# Patient Record
Sex: Male | Born: 1935
Health system: Southern US, Community
[De-identification: ages and names within clinical notes are randomized; demographics above are authoritative.]

## PROBLEM LIST (undated history)

## (undated) DIAGNOSIS — M549 Dorsalgia, unspecified: Secondary | ICD-10-CM

## (undated) DIAGNOSIS — Z9889 Other specified postprocedural states: Secondary | ICD-10-CM

## (undated) DIAGNOSIS — I10 Essential (primary) hypertension: Secondary | ICD-10-CM

## (undated) DIAGNOSIS — G8929 Other chronic pain: Secondary | ICD-10-CM

## (undated) DIAGNOSIS — J449 Chronic obstructive pulmonary disease, unspecified: Secondary | ICD-10-CM

## (undated) DIAGNOSIS — N401 Enlarged prostate with lower urinary tract symptoms: Secondary | ICD-10-CM

## (undated) DIAGNOSIS — E785 Hyperlipidemia, unspecified: Secondary | ICD-10-CM

## (undated) DIAGNOSIS — R972 Elevated prostate specific antigen [PSA]: Secondary | ICD-10-CM

## (undated) DIAGNOSIS — K644 Residual hemorrhoidal skin tags: Secondary | ICD-10-CM

## (undated) DIAGNOSIS — N138 Other obstructive and reflux uropathy: Secondary | ICD-10-CM

## (undated) DIAGNOSIS — M1990 Unspecified osteoarthritis, unspecified site: Secondary | ICD-10-CM

## (undated) HISTORY — DX: Essential (primary) hypertension: I10

## (undated) HISTORY — DX: Other specified postprocedural states: Z98.890

## (undated) HISTORY — DX: Residual hemorrhoidal skin tags: K64.4

## (undated) HISTORY — DX: Chronic obstructive pulmonary disease, unspecified: J44.9

## (undated) HISTORY — DX: Unspecified osteoarthritis, unspecified site: M19.90

## (undated) HISTORY — DX: Elevated prostate specific antigen (PSA): R97.20

## (undated) HISTORY — DX: Hyperlipidemia, unspecified: E78.5

---

## 1957-10-16 HISTORY — PX: FRACTURE SURGERY: SHX138

## 1981-10-16 HISTORY — PX: INGUINAL HERNIA REPAIR: SUR1180

## 2000-06-04 ENCOUNTER — Other Ambulatory Visit: Admission: RE | Admit: 2000-06-04 | Discharge: 2000-06-04 | Payer: Self-pay | Admitting: Urology

## 2000-06-05 ENCOUNTER — Other Ambulatory Visit: Admission: RE | Admit: 2000-06-05 | Discharge: 2000-06-05 | Payer: Self-pay | Admitting: Urology

## 2001-10-02 ENCOUNTER — Ambulatory Visit (HOSPITAL_COMMUNITY): Admission: RE | Admit: 2001-10-02 | Discharge: 2001-10-02 | Payer: Self-pay | Admitting: *Deleted

## 2001-10-02 ENCOUNTER — Encounter (INDEPENDENT_AMBULATORY_CARE_PROVIDER_SITE_OTHER): Payer: Self-pay | Admitting: *Deleted

## 2001-10-16 LAB — HM COLONOSCOPY: HM Colonoscopy: NORMAL

## 2004-04-10 ENCOUNTER — Emergency Department (HOSPITAL_COMMUNITY): Admission: EM | Admit: 2004-04-10 | Discharge: 2004-04-10 | Payer: Self-pay | Admitting: Emergency Medicine

## 2004-04-13 ENCOUNTER — Emergency Department (HOSPITAL_COMMUNITY): Admission: EM | Admit: 2004-04-13 | Discharge: 2004-04-13 | Payer: Self-pay | Admitting: Emergency Medicine

## 2005-05-24 ENCOUNTER — Emergency Department (HOSPITAL_COMMUNITY): Admission: EM | Admit: 2005-05-24 | Discharge: 2005-05-24 | Payer: Self-pay | Admitting: Emergency Medicine

## 2010-08-18 ENCOUNTER — Inpatient Hospital Stay (HOSPITAL_COMMUNITY)
Admission: EM | Admit: 2010-08-18 | Discharge: 2010-08-22 | Payer: Self-pay | Source: Home / Self Care | Admitting: Emergency Medicine

## 2010-08-18 IMAGING — CR DG CHEST 2V
2 series · 2 of 2 positions shown · non-contrast
Comparison: None.

CLINICAL DATA: Decreased O2 sats.  Urinary retention.

CHEST - 2 VIEW

[view not recorded (1 of 2)]
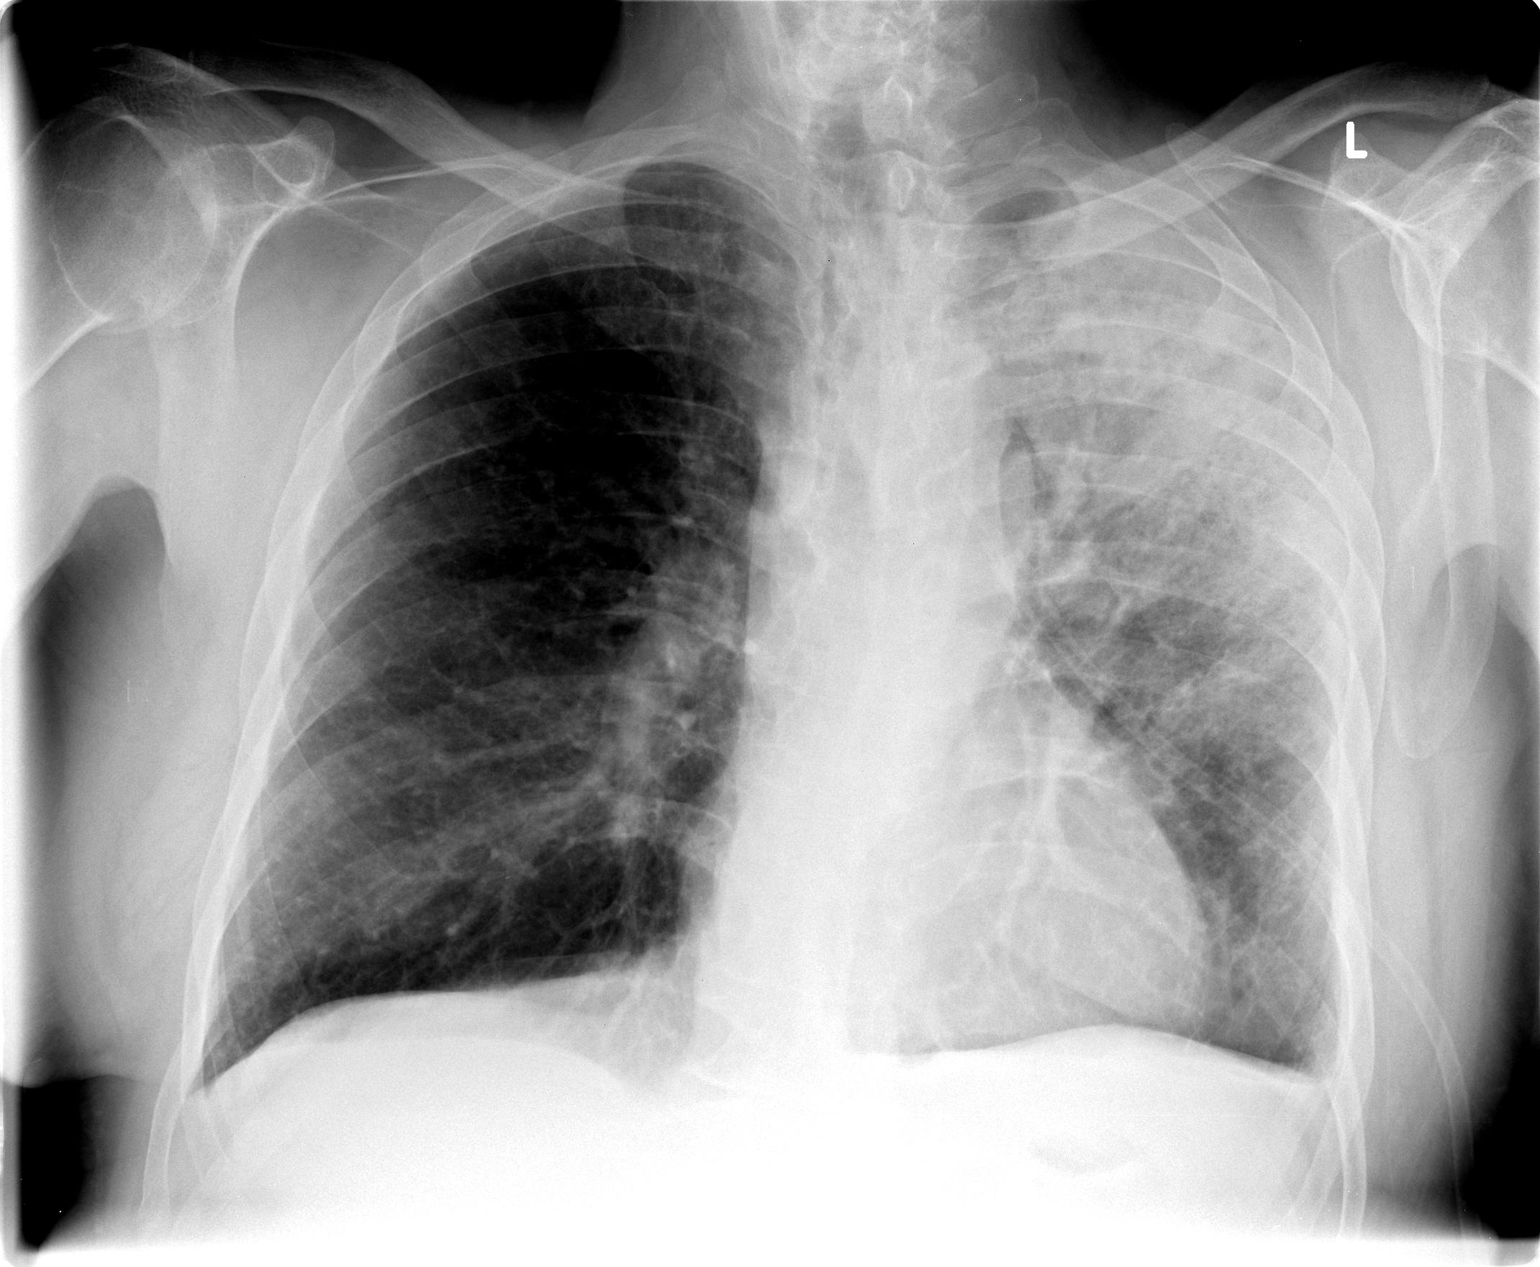

[view not recorded (2 of 2)]
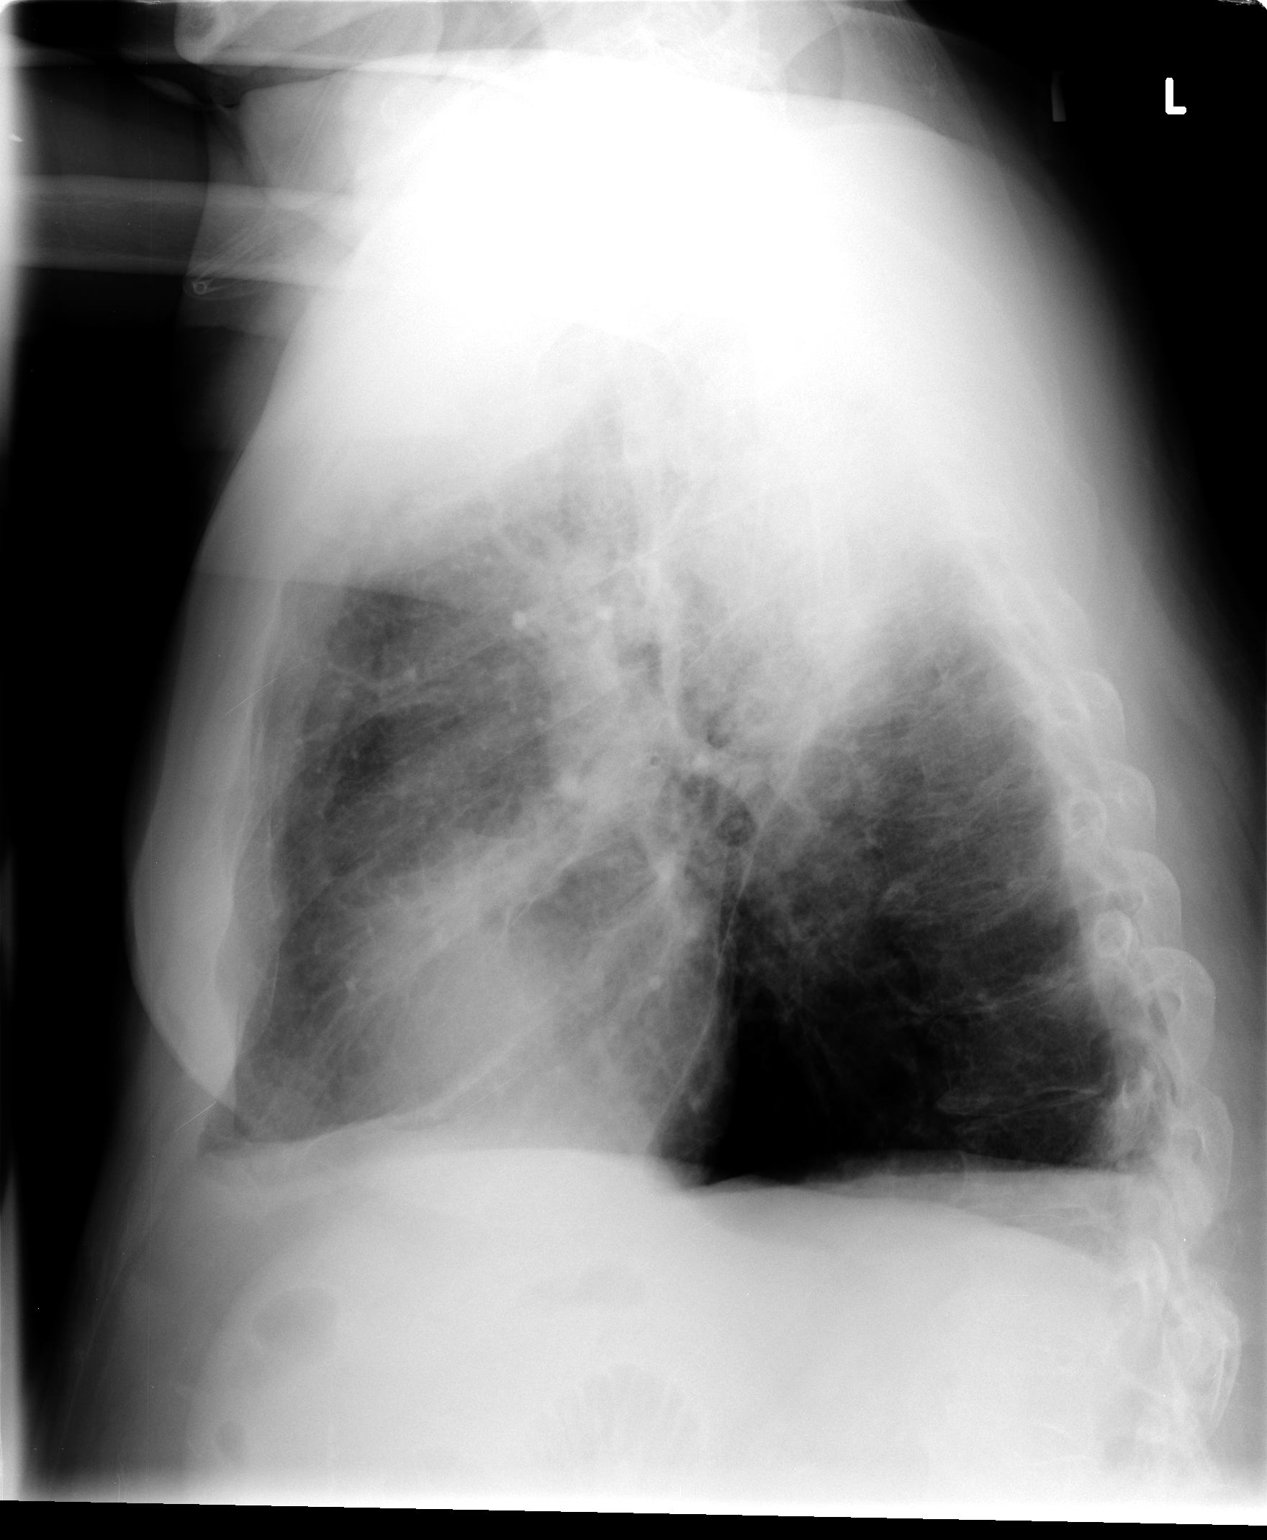

[2 of 2 positions shown; findings below may reference images not displayed]

FINDINGS: Trachea is midline.  Heart size normal.  There is left
upper lobe air space consolidation.  Probable underlying emphysema.
Tiny left pleural effusion or pleural thickening.
IMPRESSION: Left upper lobe air space consolidation likely represents
pneumonia.  Follow-up to clearing is recommended, as a central
obstructing mass can create this appearance.

## 2010-08-21 IMAGING — CT CT ANGIO CHEST
1 of 6 series · 5 of 36 positions shown · IV contrast (Omnipaque 300)
Comparison: Chest x-ray [DATE]

CLINICAL DATA: Pneumonia, urinary retention.  Hypoxia.  History of
smoking.

CT ANGIOGRAPHY CHEST WITH CONTRAST
TECHNIQUE: Multidetector CT imaging of the chest was performed
using the standard protocol during bolus administration of
intravenous contrast.  Multiplanar CT image reconstructions
including MIPs were obtained to evaluate the vascular anatomy.
Contrast:  100 ml [ET] the

[Series 4: pe 3.0 b40f · axial · 0.71mm/px · z∈[-193,-28]mm · 5 of 83 slices shown]
[im 14/83  lung]
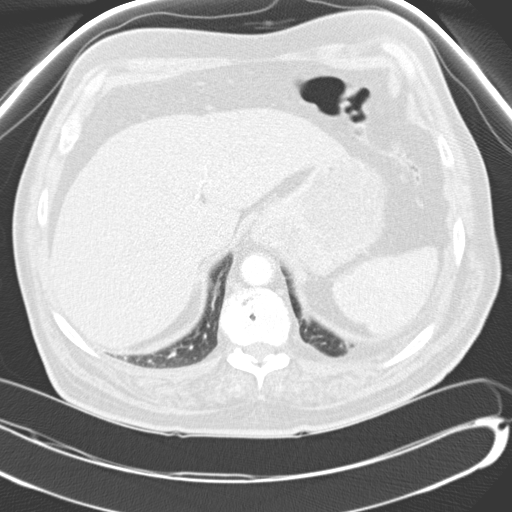
[im 28/83  mediastinal]
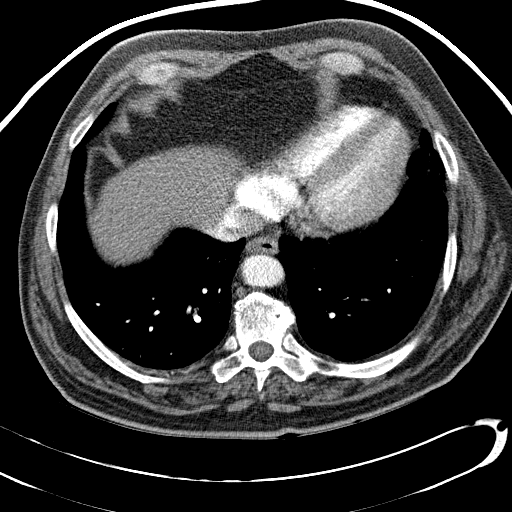
[im 42/83  lung]
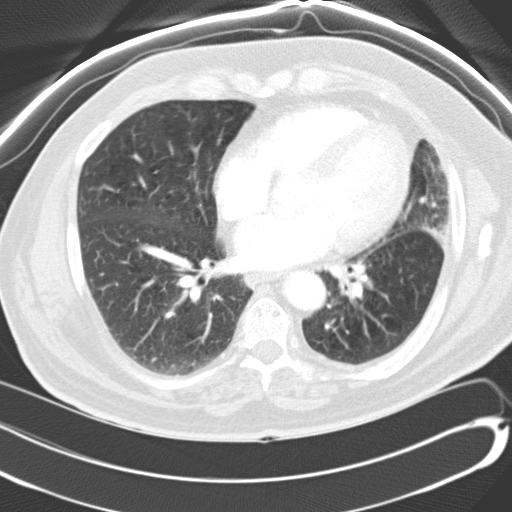
[im 55/83  mediastinal]
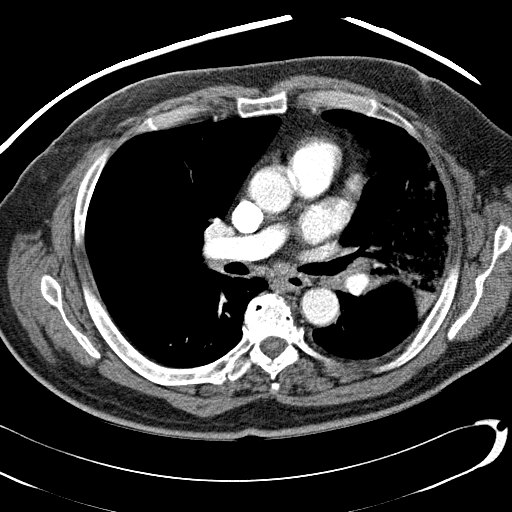
[im 69/83  lung]
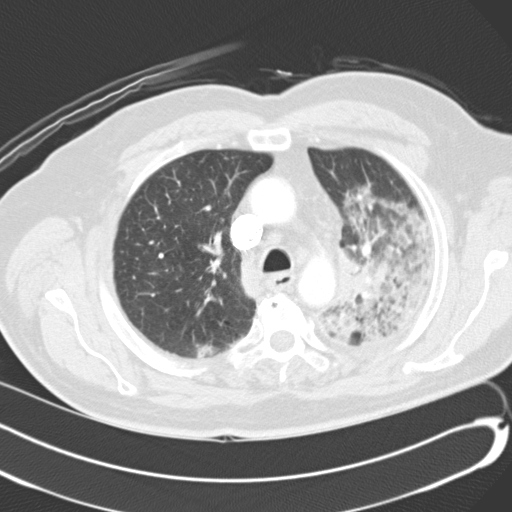

[5 of 36 positions shown; findings below may reference images not displayed]

FINDINGS: The pulmonary arteries are well opacified by the
contrast bolus.  There is no evidence for acute pulmonary embolus.
There is dense air space filling within the left upper lobe.  Left
hilar mediastinal lymph nodes are enlarged.  Prevascular lymph node
is 2.2 cm.  AP window lymph node is 1.9 cm.  Left  hilar node is
2.2 cm, best seen on the coronal images.  No  central endobronchial
lesion is identified.  Although the findings may be related to
infection and reactive lymphadenopathy, malignancy is difficult to
exclude.  Follow-up to document clearing is recommended.

The heart size is normal.  No evidence for pulmonary edema.  There
is minimal atelectasis in the posterior right lung which is
otherwise clear.

Images of the upper abdomen show 2 low attenuation lesions within
the liver, too small to characterize.  These measure 9 mm each and
are both seen on image 67 of the axial series.

Review of the MIP images confirms the above findings.
IMPRESSION: 1.  Technically adequate exam showing no evidence for acute
pulmonary embolus.
2.  Extensive consolidation throughout the left upper lobe,
associated with mediastinal and hilar adenopathy.  Follow-up until
clearing is recommended to exclude malignancy.

## 2010-10-16 DIAGNOSIS — R972 Elevated prostate specific antigen [PSA]: Secondary | ICD-10-CM

## 2010-10-16 HISTORY — DX: Elevated prostate specific antigen (PSA): R97.20

## 2010-12-27 LAB — COMPREHENSIVE METABOLIC PANEL
ALT: 11 U/L (ref 0–53)
AST: 16 U/L (ref 0–37)
Albumin: 2.5 g/dL — ABNORMAL LOW (ref 3.5–5.2)
Alkaline Phosphatase: 72 U/L (ref 39–117)
BUN: 15 mg/dL (ref 6–23)
CO2: 25 mEq/L (ref 19–32)
Calcium: 9.2 mg/dL (ref 8.4–10.5)
Chloride: 105 mEq/L (ref 96–112)
Creatinine, Ser: 0.92 mg/dL (ref 0.4–1.5)
GFR calc Af Amer: >60 mL/min (ref >60)
GFR calc non Af Amer: >60 mL/min (ref >60)
Glucose, Bld: 101 mg/dL — ABNORMAL HIGH (ref 70–99)
Potassium: 3.3 mEq/L — ABNORMAL LOW (ref 3.5–5.1)
Sodium: 137 mEq/L (ref 135–145)
Total Bilirubin: 0.6 mg/dL (ref 0.3–1.2)
Total Protein: 5.8 g/dL — ABNORMAL LOW (ref 6.0–8.3)

## 2010-12-27 LAB — BASIC METABOLIC PANEL
BUN: 11 mg/dL (ref 6–23)
BUN: 17 mg/dL (ref 6–23)
BUN: 20 mg/dL (ref 6–23)
CO2: 23 mEq/L (ref 19–32)
CO2: 25 mEq/L (ref 19–32)
CO2: 29 mEq/L (ref 19–32)
Calcium: 10 mg/dL (ref 8.4–10.5)
Calcium: 9.1 mg/dL (ref 8.4–10.5)
Calcium: 9.2 mg/dL (ref 8.4–10.5)
Chloride: 104 mEq/L (ref 96–112)
Chloride: 105 mEq/L (ref 96–112)
Chloride: 96 mEq/L (ref 96–112)
Creatinine, Ser: 0.77 mg/dL (ref 0.4–1.5)
Creatinine, Ser: 0.93 mg/dL (ref 0.4–1.5)
Creatinine, Ser: 1.26 mg/dL (ref 0.4–1.5)
GFR calc Af Amer: >60 mL/min (ref >60)
GFR calc Af Amer: >60 mL/min (ref >60)
GFR calc Af Amer: >60 mL/min (ref >60)
GFR calc non Af Amer: 56 mL/min — ABNORMAL LOW (ref >60)
GFR calc non Af Amer: >60 mL/min (ref >60)
GFR calc non Af Amer: >60 mL/min (ref >60)
Glucose, Bld: 127 mg/dL — ABNORMAL HIGH (ref 70–99)
Glucose, Bld: 160 mg/dL — ABNORMAL HIGH (ref 70–99)
Glucose, Bld: 176 mg/dL — ABNORMAL HIGH (ref 70–99)
Potassium: 3.6 mEq/L (ref 3.5–5.1)
Potassium: 3.8 mEq/L (ref 3.5–5.1)
Potassium: 4.2 mEq/L (ref 3.5–5.1)
Sodium: 131 mEq/L — ABNORMAL LOW (ref 135–145)
Sodium: 134 mEq/L — ABNORMAL LOW (ref 135–145)
Sodium: 140 mEq/L (ref 135–145)

## 2010-12-27 LAB — BLOOD GAS, ARTERIAL
Acid-Base Excess: 1 mmol/L (ref 0.0–2.0)
Bicarbonate: 24.4 mEq/L — ABNORMAL HIGH (ref 20.0–24.0)
O2 Content: 2 L/min
O2 Saturation: 90.3 %
Patient temperature: 37
TCO2: 20.2 mmol/L (ref 0–100)
pCO2 arterial: 34.6 mmHg — ABNORMAL LOW (ref 35.0–45.0)
pH, Arterial: 7.462 — ABNORMAL HIGH (ref 7.350–7.450)
pO2, Arterial: 53.6 mmHg — ABNORMAL LOW (ref 80.0–100.0)

## 2010-12-27 LAB — CBC
HCT: 35.8 % — ABNORMAL LOW (ref 39.0–52.0)
HCT: 38.8 % — ABNORMAL LOW (ref 39.0–52.0)
HCT: 42.5 % (ref 39.0–52.0)
Hemoglobin: 12.1 g/dL — ABNORMAL LOW (ref 13.0–17.0)
Hemoglobin: 13 g/dL (ref 13.0–17.0)
Hemoglobin: 14.1 g/dL (ref 13.0–17.0)
MCH: 29.1 pg (ref 26.0–34.0)
MCH: 29.2 pg (ref 26.0–34.0)
MCH: 29.5 pg (ref 26.0–34.0)
MCHC: 33.2 g/dL (ref 30.0–36.0)
MCHC: 33.5 g/dL (ref 30.0–36.0)
MCHC: 33.9 g/dL (ref 30.0–36.0)
MCV: 87 fL (ref 78.0–100.0)
MCV: 87.3 fL (ref 78.0–100.0)
MCV: 87.4 fL (ref 78.0–100.0)
Platelets: 143 10*3/uL — ABNORMAL LOW (ref 150–400)
Platelets: 149 10*3/uL — ABNORMAL LOW (ref 150–400)
Platelets: 181 10*3/uL (ref 150–400)
RBC: 4.12 MIL/uL — ABNORMAL LOW (ref 4.22–5.81)
RBC: 4.45 MIL/uL (ref 4.22–5.81)
RBC: 4.86 MIL/uL (ref 4.22–5.81)
RDW: 13.7 % (ref 11.5–15.5)
RDW: 14 % (ref 11.5–15.5)
RDW: 14 % (ref 11.5–15.5)
WBC: 15.2 10*3/uL — ABNORMAL HIGH (ref 4.0–10.5)
WBC: 19.3 10*3/uL — ABNORMAL HIGH (ref 4.0–10.5)
WBC: 8.9 10*3/uL (ref 4.0–10.5)

## 2010-12-27 LAB — DIFFERENTIAL
Basophils Absolute: 0 10*3/uL (ref 0.0–0.1)
Basophils Absolute: 0 10*3/uL (ref 0.0–0.1)
Basophils Absolute: 0 10*3/uL (ref 0.0–0.1)
Basophils Relative: 0 % (ref 0–1)
Basophils Relative: 0 % (ref 0–1)
Basophils Relative: 0 % (ref 0–1)
Eosinophils Absolute: 0 10*3/uL (ref 0.0–0.7)
Eosinophils Absolute: 0 10*3/uL (ref 0.0–0.7)
Eosinophils Absolute: 0 10*3/uL (ref 0.0–0.7)
Eosinophils Relative: 0 % (ref 0–5)
Eosinophils Relative: 0 % (ref 0–5)
Eosinophils Relative: 0 % (ref 0–5)
Lymphocytes Relative: 10 % — ABNORMAL LOW (ref 12–46)
Lymphocytes Relative: 4 % — ABNORMAL LOW (ref 12–46)
Lymphocytes Relative: 8 % — ABNORMAL LOW (ref 12–46)
Lymphs Abs: 0.8 10*3/uL (ref 0.7–4.0)
Lymphs Abs: 0.9 10*3/uL (ref 0.7–4.0)
Lymphs Abs: 1.2 10*3/uL (ref 0.7–4.0)
Monocytes Absolute: 0.6 10*3/uL (ref 0.1–1.0)
Monocytes Absolute: 1.1 10*3/uL — ABNORMAL HIGH (ref 0.1–1.0)
Monocytes Absolute: 2.7 10*3/uL — ABNORMAL HIGH (ref 0.1–1.0)
Monocytes Relative: 14 % — ABNORMAL HIGH (ref 3–12)
Monocytes Relative: 6 % (ref 3–12)
Monocytes Relative: 7 % (ref 3–12)
Neutro Abs: 12.9 10*3/uL — ABNORMAL HIGH (ref 1.7–7.7)
Neutro Abs: 15.8 10*3/uL — ABNORMAL HIGH (ref 1.7–7.7)
Neutro Abs: 7.4 10*3/uL (ref 1.7–7.7)
Neutrophils Relative %: 82 % — ABNORMAL HIGH (ref 43–77)
Neutrophils Relative %: 83 % — ABNORMAL HIGH (ref 43–77)
Neutrophils Relative %: 85 % — ABNORMAL HIGH (ref 43–77)

## 2010-12-27 LAB — URINALYSIS, ROUTINE W REFLEX MICROSCOPIC
Bilirubin Urine: NEGATIVE
Glucose, UA: NEGATIVE mg/dL
Ketones, ur: NEGATIVE mg/dL
Leukocytes, UA: NEGATIVE
Nitrite: NEGATIVE
Specific Gravity, Urine: 1.01 (ref 1.005–1.030)
Urobilinogen, UA: 1 mg/dL (ref 0.0–1.0)
pH: 6.5 (ref 5.0–8.0)

## 2010-12-27 LAB — LEGIONELLA ANTIGEN, URINE: Legionella Antigen, Urine: NEGATIVE

## 2010-12-27 LAB — URINE CULTURE
Colony Count: NO GROWTH
Culture  Setup Time: 201111031143
Culture: NO GROWTH

## 2010-12-27 LAB — URINE MICROSCOPIC-ADD ON

## 2010-12-27 LAB — EXPECTORATED SPUTUM ASSESSMENT W GRAM STAIN, RFLX TO RESP C

## 2011-03-03 NOTE — Procedures (Signed)
[place]  Patient:    Kenneth Woods, Kenneth Woods Visit Number: [id number] MRN: [id number]          Service Type: END Location: ENDO Attending Physician:  Council Mechanic Dictated by:   Anselmo Rod, M.D. Proc. Date: 10/02/01 Admit Date:  10/02/2001   CC:         Dr. Gerome Apley   Procedure Report  DATE OF BIRTH:  02-04-1934.  PROCEDURE:  Colonoscopy with snare polypectomy x 1 and hot biopsies x 4.  ENDOSCOPIST:  Anselmo Rod, M.D.  INSTRUMENT USED:  Adult video colonoscope.  INDICATION FOR PROCEDURE:  Blood in stool in a 77 year old white male.  Rule out colonic polyps, masses, hemorrhoids, etc.  PREPROCEDURE PREPARATION:  Informed consent was procured from the patient. The patient was fasted for eight hours prior to the procedure and prepped with a bottle of magnesium citrate and a gallon of NuLytely the night prior to the procedure.  PREPROCEDURE PHYSICAL:  VITAL SIGNS:  The patient had stable vital signs.  NECK:  Supple.  CHEST:  Clear to auscultation except for some decreased breath sounds at the bases.  No murmur, rub, or gallop.  ABDOMEN:  Obese with normal bowel sounds.  DESCRIPTION OF PROCEDURE:  The patient was placed in the left lateral decubitus position and sedated with 60 mg of Demerol and 6 mg of Versed intravenously.  Once the patient was adequately sedate and maintained on low-flow oxygen and continuous cardiac monitoring, the Olympus video colonoscope was advanced from the rectum to the cecum without difficulty.  The patient had prominent internal hemorrhoids and early left-sided diverticulosis.  Three small polyps were hot biopsied from the rectum and one polyp was snared from 60 cm.  Another polyp was hot biopsied from 60 cm.  The patient tolerated the procedure well without complications.  The cecum and the appendiceal orifice appeared healthy and without lesions.  IMPRESSION: 1. Multiple colonic  polyps removed as mentioned above. 2. Prominent internal hemorrhoids. 3. Early left-sided diverticulosis.  RECOMMENDATIONS: 1. Await pathology results. 2. Avoid all nonsteroidals, including aspirin, for the next three weeks. 3. Outpatient follow-up in the next four weeks for further recommendations. Dictated by:   Anselmo Rod, M.D. Attending Physician:  Council Mechanic DD:  10/02/01 TD:  10/02/01 Job: [id number]

## 2012-12-14 ENCOUNTER — Encounter: Payer: Self-pay | Admitting: *Deleted

## 2013-01-19 ENCOUNTER — Emergency Department (HOSPITAL_COMMUNITY)
Admission: EM | Admit: 2013-01-19 | Discharge: 2013-01-20 | Disposition: A | Discharge status: Home / Self Care | Payer: Medicare Other | Attending: Emergency Medicine | Admitting: Emergency Medicine

## 2013-01-19 ENCOUNTER — Encounter (HOSPITAL_COMMUNITY): Payer: Self-pay | Admitting: Emergency Medicine

## 2013-01-19 DIAGNOSIS — R339 Retention of urine, unspecified: Secondary | ICD-10-CM

## 2013-01-19 DIAGNOSIS — Z87891 Personal history of nicotine dependence: Secondary | ICD-10-CM | POA: Insufficient documentation

## 2013-01-19 DIAGNOSIS — R109 Unspecified abdominal pain: Secondary | ICD-10-CM | POA: Insufficient documentation

## 2013-01-19 DIAGNOSIS — Z79899 Other long term (current) drug therapy: Secondary | ICD-10-CM | POA: Insufficient documentation

## 2013-01-19 DIAGNOSIS — R143 Flatulence: Secondary | ICD-10-CM | POA: Insufficient documentation

## 2013-01-19 DIAGNOSIS — R300 Dysuria: Secondary | ICD-10-CM | POA: Insufficient documentation

## 2013-01-19 DIAGNOSIS — R141 Gas pain: Secondary | ICD-10-CM | POA: Insufficient documentation

## 2013-01-19 DIAGNOSIS — N400 Enlarged prostate without lower urinary tract symptoms: Secondary | ICD-10-CM | POA: Insufficient documentation

## 2013-01-19 DIAGNOSIS — R142 Eructation: Secondary | ICD-10-CM | POA: Insufficient documentation

## 2013-01-19 MED ORDER — LIDOCAINE HCL 2 % EX GEL
Freq: Once | CUTANEOUS | Status: AC
  Administered 2013-01-20: 15 via URETHRAL
  Filled 2013-01-19: qty 20

## 2013-01-19 NOTE — ED Notes (Signed)
PT. REPORTS URINARY RETENTION TODAY STATES HISTORY OF PROSTATE ENLARGEMENT , LAST VOIDED 5 HOURS AGO.

## 2013-01-20 LAB — URINALYSIS, MICROSCOPIC ONLY
Bilirubin Urine: NEGATIVE
Glucose, UA: NEGATIVE mg/dL
Hgb urine dipstick: NEGATIVE
Ketones, ur: NEGATIVE mg/dL
Leukocytes, UA: NEGATIVE
Nitrite: NEGATIVE
Protein, ur: 30 mg/dL — AB
Specific Gravity, Urine: 1.014 (ref 1.005–1.030)
Urobilinogen, UA: 0.2 mg/dL (ref 0.0–1.0)
pH: 8.5 — ABNORMAL HIGH (ref 5.0–8.0)

## 2013-01-20 LAB — POCT I-STAT, CHEM 8
BUN: 9 mg/dL (ref 6–23)
Calcium, Ion: 1.09 mmol/L — ABNORMAL LOW (ref 1.13–1.30)
Chloride: 98 mEq/L (ref 96–112)
Creatinine, Ser: 0.9 mg/dL (ref 0.50–1.35)
Glucose, Bld: 131 mg/dL — ABNORMAL HIGH (ref 70–99)
HCT: 47 % (ref 39.0–52.0)
Hemoglobin: 16 g/dL (ref 13.0–17.0)
Potassium: 3.3 mEq/L — ABNORMAL LOW (ref 3.5–5.1)
Sodium: 140 mEq/L (ref 135–145)
TCO2: 32 mmol/L (ref 0–100)

## 2013-01-20 NOTE — ED Notes (Signed)
Pt alert, restless, uncomfortable, standing, pacing, "unable to lie down",  c/o bladder pain, last void ~ 5 hrs ago, "relates sx to enlarged prostate", denies other urinary sx or issues, "has had a foley before", denies h/o GU surgery. Pt cooperative. Family at [facility].

## 2013-01-20 NOTE — ED Provider Notes (Signed)
History     CSN: [id number]  Arrival date & time 01/19/13  2337   First MD Initiated Contact with Patient 01/19/13 2354      Chief Complaint  Patient presents with  . Urinary Retention    (Consider location/radiation/quality/duration/timing/severity/associated sxs/prior treatment) HPI Pt with history of BPH and urinary retention p/w urinary retention starting at 1200 yesterday. +suprapubic fullness. No fever, chills, hematuria, dysuria, flank pain. Foley cath in place and pt is currently asymptomatic.  Past Medical History  Diagnosis Date  . S/P TURP   . H/O prostate biopsy   . Elevated PSA 10/16/2010  . Prostate enlargement     Past Surgical History  Procedure Laterality Date  . Fracture surgery  1959    Work accident-caused FX whole body expect rt arm,was in body cast- was working on [employer]. fell off- hosp for 5 mos.    Family History  Problem Relation Age of Onset  . CVA Mother   . Cancer Father     brain tumor    History  Substance Use Topics  . Smoking status: Former Games developer  . Smokeless tobacco: Not on file  . Alcohol Use: No      Review of Systems  Constitutional: Negative for fever and chills.  Respiratory: Negative for shortness of breath.   Cardiovascular: Negative for chest pain.  Gastrointestinal: Positive for abdominal pain and abdominal distention. Negative for nausea, vomiting, diarrhea and constipation.  Genitourinary: Positive for difficulty urinating. Negative for dysuria, frequency, hematuria and flank pain.  Musculoskeletal: Negative for myalgias and back pain.  Skin: Negative for pallor, rash and wound.  Neurological: Negative for dizziness, weakness, numbness and headaches.  All other systems reviewed and are negative.    Allergies  Penicillins  Home Medications   Current Outpatient Rx  Name  Route  Sig  Dispense  Refill  . amLODipine (NORVASC) 5 MG tablet   Oral   Take 5 mg by mouth daily.         Marland Kitchen  lisinopril (PRINIVIL,ZESTRIL) 5 MG tablet   Oral   Take 5 mg by mouth daily.         Marland Kitchen OVER THE COUNTER MEDICATION   Oral   Take 1 tablet by mouth daily. Herbal supplement for prostate         . tamsulosin (FLOMAX) 0.4 MG CAPS   Oral   Take 0.4 mg by mouth daily.           BP 195/90  Pulse 86  Temp(Src) 97.7 F (36.5 C) (Oral)  Resp 16  SpO2 94%  Physical Exam  Nursing note and vitals reviewed. Constitutional: He is oriented to person, place, and time. He appears well-developed and well-nourished. No distress.  HENT:  Head: Normocephalic and atraumatic.  Mouth/Throat: Oropharynx is clear and moist.  Eyes: EOM are normal. Pupils are equal, round, and reactive to light.  Neck: Normal range of motion. Neck supple.  Cardiovascular: Normal rate and regular rhythm.   Pulmonary/Chest: Effort normal and breath sounds normal. No respiratory distress. He has no wheezes. He has no rales.  Abdominal: Soft. Bowel sounds are normal. He exhibits no distension. There is no tenderness. There is no rebound and no guarding.  Musculoskeletal: Normal range of motion. He exhibits no edema and no tenderness.  Neurological: He is alert and oriented to person, place, and time.  Skin: Skin is warm and dry. No rash noted. No erythema.  Psychiatric: He has a normal mood and affect.  His behavior is normal.    ED Course  Procedures (including critical care time)  Labs Reviewed  URINALYSIS, MICROSCOPIC ONLY - Abnormal; Notable for the following:    APPearance CLOUDY (*)    pH 8.5 (*)    Protein, ur 30 (*)    Bacteria, UA FEW (*)    All other components within normal limits  POCT I-STAT, CHEM 8 - Abnormal; Notable for the following:    Potassium 3.3 (*)    Glucose, Bld 131 (*)    Calcium, Ion 1.09 (*)    All other components within normal limits   No results found.   1. Urinary retention       MDM   Follow up with urology.        Loren Racer, MD 01/20/13 [phone]

## 2013-03-11 ENCOUNTER — Encounter: Payer: Self-pay | Admitting: Physician Assistant

## 2013-03-24 ENCOUNTER — Other Ambulatory Visit: Payer: Self-pay | Admitting: Family Medicine

## 2013-04-24 ENCOUNTER — Ambulatory Visit (INDEPENDENT_AMBULATORY_CARE_PROVIDER_SITE_OTHER): Payer: Medicare Other | Admitting: Physician Assistant

## 2013-04-24 ENCOUNTER — Encounter: Payer: Self-pay | Admitting: Physician Assistant

## 2013-04-24 VITALS — BP 134/76 | HR 60 | Temp 98.1°F | Resp 18 | Ht 62.25 in | Wt 176.0 lb

## 2013-04-24 DIAGNOSIS — J449 Chronic obstructive pulmonary disease, unspecified: Secondary | ICD-10-CM | POA: Insufficient documentation

## 2013-04-24 DIAGNOSIS — M1990 Unspecified osteoarthritis, unspecified site: Secondary | ICD-10-CM

## 2013-04-24 DIAGNOSIS — R972 Elevated prostate specific antigen [PSA]: Secondary | ICD-10-CM

## 2013-04-24 DIAGNOSIS — M129 Arthropathy, unspecified: Secondary | ICD-10-CM

## 2013-04-24 DIAGNOSIS — E785 Hyperlipidemia, unspecified: Secondary | ICD-10-CM | POA: Insufficient documentation

## 2013-04-24 DIAGNOSIS — I10 Essential (primary) hypertension: Secondary | ICD-10-CM | POA: Insufficient documentation

## 2013-04-24 DIAGNOSIS — N400 Enlarged prostate without lower urinary tract symptoms: Secondary | ICD-10-CM | POA: Insufficient documentation

## 2013-04-24 LAB — BASIC METABOLIC PANEL WITH GFR
BUN: 11 mg/dL (ref 6–23)
CO2: 29 mEq/L (ref 19–32)
Calcium: 9.7 mg/dL (ref 8.4–10.5)
Chloride: 101 mEq/L (ref 96–112)
Creat: 0.91 mg/dL (ref 0.50–1.35)
GFR, Est African American: >89 mL/min
GFR, Est Non African American: 81 mL/min
Glucose, Bld: 105 mg/dL — ABNORMAL HIGH (ref 70–99)
Potassium: 4.9 mEq/L (ref 3.5–5.3)
Sodium: 137 mEq/L (ref 135–145)

## 2013-04-24 LAB — PSA, MEDICARE: PSA: 10.03 ng/mL — ABNORMAL HIGH (ref <=4.00)

## 2013-04-24 MED ORDER — FINASTERIDE 5 MG PO TABS: 5.0000 mg | ORAL_TABLET | Freq: Every day | ORAL | Status: DC

## 2013-04-24 MED ORDER — AMLODIPINE BESYLATE 5 MG PO TABS: 5.0000 mg | ORAL_TABLET | Freq: Every day | ORAL | Status: DC

## 2013-04-24 MED ORDER — LISINOPRIL 5 MG PO TABS: ORAL_TABLET | ORAL | Status: DC

## 2013-04-24 MED ORDER — TAMSULOSIN HCL 0.4 MG PO CAPS: 0.4000 mg | ORAL_CAPSULE | Freq: Every day | ORAL | Status: DC

## 2013-04-24 NOTE — Progress Notes (Signed)
Patient ID: Kenneth Woods MRN: [id number], DOB: 1933/11/18, 78 y.o. Date of Encounter: @DATE @  Chief Complaint:  Chief Complaint  Patient presents with  . 6 mth check up    is fasting    HPI: 79 y.o. year old white male  presents for ROV.  1- BPH: Pt went to ER 01/20/13 sec to being unable to urinate for 5 hours. The ER eval only included a i Stat and UA. Their note says "f/u with urology." This is in epic. However, there is no note or lab etc after that (from urology-I donot tink urology is on epic)-I will obtain records. Pt says he did see urology. They added Finesteride 5mg  QD. He actually has bottle with him and Dr. Margrett Rud name is on it so he must have seen urology.He says "that fixed it". Not having any problem now. Is still on flomax as well. He says urology told him he did not have to f/ with them if he had no furhter problem.   2- H/O elevated PSA: In past he refused f/u with urology. Even at LOV here1/10/14. At that time he did not even want me to recheck PSA. However, today he is ok with recheck. He says "I know it will be ok b/c my problem is gone."  3-HTN: Is taking med as directed. No adv effect.   No angina symptoms.   Past Medical History  Diagnosis Date  . S/P TURP   . H/O prostate biopsy   . Elevated PSA 10/16/2010  . Prostate enlargement   . Hyperlipidemia   . Hypertension   . COPD (chronic obstructive pulmonary disease)   . Arthritis   . BPH with elevated PSA      Home Meds: See attached medication section for current medication list. Any medications entered into computer today will not appear on this note's list. The medications listed below were entered prior to today. No current outpatient prescriptions on file prior to visit.   No current facility-administered medications on file prior to visit.    Allergies:  Allergies  Allergen Reactions  . Penicillins Anaphylaxis    History   Social History  . Marital Status: Widowed    Spouse Name:  N/A    Number of Children: N/A  . Years of Education: N/A   Occupational History  . Not on file.   Social History Main Topics  . Smoking status: Former Games developer  . Smokeless tobacco: Not on file  . Alcohol Use: No  . Drug Use: No  . Sexually Active: Not on file   Other Topics Concern  . Not on file   Social History Narrative  . No narrative on file    Family History  Problem Relation Age of Onset  . CVA Mother   . Cancer Father     brain tumor     Review of Systems:  See HPI for pertinent ROS. All other ROS negative.    Physical Exam: Blood pressure 134/76, pulse 60, temperature 98.1 F (36.7 C), temperature source Oral, resp. rate 18, height 5' 2.25" (1.581 m), weight 176 lb (79.833 kg)., Body mass index is 31.94 kg/(m^2). General: WNWD WM. Appears in no acute distress. Neck: Supple. No thyromegaly. No lymphadenopathy.No carotid bruits. Lungs: Clear bilaterally to auscultation without wheezes, rales, or rhonchi. Breathing is unlabored. Heart: RRR with S1 S2. No murmurs, rubs, or gallops. Abdomen: Soft, non-tender, non-distended with normoactive bowel sounds. No hepatomegaly. No rebound/guarding. No obvious abdominal masses. Musculoskeletal:  Strength and  tone normal for age. Extremities/Skin: Warm and dry. No clubbing or cyanosis. No edema. No rashes or suspicious lesions. Neuro: Alert and oriented X 3. Moves all extremities spontaneously. Gait is normal. CNII-XII grossly in tact. Psych:  Responds to questions appropriately with a normal affect.     ASSESSMENT AND PLAN:  79 y.o. year old male with  1. Hyperlipidemia Cholesterol was WNL 10/2011.   2. Hypertension At Goal. Cont current med. Check lab.  - BASIC METABOLIC PANEL WITH GFR - amLODipine (NORVASC) 5 MG tablet; Take 1 tablet (5 mg total) by mouth daily.  Dispense: 30 tablet; Refill: 5 - lisinopril (PRINIVIL,ZESTRIL) 5 MG tablet; TAKE 1 TABLET BY MOUTH DAILY  Dispense: 30 tablet; Refill: 5  3.  Arthritis  4. COPD (chronic obstructive pulmonary disease) He had PFTs in past-c/w stage 2 emhysema. Was on Symbicort in past. He stopped this and continues to report that his breathing is stable off med.  Quit smoking 2003. 40 pack year history.  5. BPH with elevated PSA Will check PSA. Will get urologyrecords.  - PSA, Medicare - finasteride (PROSCAR) 5 MG tablet; Take 1 tablet (5 mg total) by mouth daily.  Dispense: 30 tablet; Refill: 5 - tamsulosin (FLOMAX) 0.4 MG CAPS; Take 1 capsule (0.4 mg total) by mouth daily.  Dispense: 30 capsule; Refill: 5  6. Elevated PSA - PSA, Medicare H/O TURP and Prostate Biopsy.  10/2010-Elevated PSA--Refused Biopsy then.   After that refused f/u-now agreeable to recheck.  7. Colonoscpoy: per pt-had 2003. Refuses f/u  8. Immunizations   Pneumococcal: 11/04/2012  Influenza: 11/04/2012   Signed, Shon Hale [place], [place], [facility] 04/24/2013 10:49 AM

## 2013-04-25 ENCOUNTER — Telehealth: Payer: Self-pay | Admitting: Family Medicine

## 2013-04-25 ENCOUNTER — Ambulatory Visit: Payer: Self-pay | Admitting: Physician Assistant

## 2013-04-25 DIAGNOSIS — R972 Elevated prostate specific antigen [PSA]: Secondary | ICD-10-CM

## 2013-04-25 NOTE — Telephone Encounter (Signed)
Message copied by Donne Anon on Fri Apr 25, 2013  3:15 PM ------      Message from: Allayne Butcher      Created: Fri Apr 25, 2013 10:44 AM       BMET is wnl. Cont current meds.       His PSA is VERY HIGH. - 10.3. It should be < 4. He really needs to f/u with urology.      4/14 he went to ER sec to urinary retention. ER placed foley catheter. He then say urology. Yesterday after his OV I got OV note from urology. It says they wre going to check PSA but I did not get that report of PSA.       I am concerned he may have prostate cancer. This PSA is EXTREMELY high. Tell him to f/u with urology-4/14 he saw Dr. Vernie Ammons at [facility].      Fax them my OV note from yesterday and these labs. (They are not on Epic) ------

## 2013-04-28 NOTE — Telephone Encounter (Signed)
Spoke to patient about elevated PSA. Told needs to follow up with Urology.  Referral sent for Urology

## 2013-05-16 ENCOUNTER — Encounter: Payer: Self-pay | Admitting: Family Medicine

## 2013-05-16 DIAGNOSIS — R339 Retention of urine, unspecified: Secondary | ICD-10-CM | POA: Insufficient documentation

## 2013-05-27 ENCOUNTER — Other Ambulatory Visit: Payer: Self-pay | Admitting: Urology

## 2013-06-09 ENCOUNTER — Encounter (HOSPITAL_BASED_OUTPATIENT_CLINIC_OR_DEPARTMENT_OTHER): Payer: Self-pay | Admitting: *Deleted

## 2013-06-10 ENCOUNTER — Encounter (HOSPITAL_BASED_OUTPATIENT_CLINIC_OR_DEPARTMENT_OTHER): Payer: Self-pay | Admitting: *Deleted

## 2013-06-10 NOTE — Progress Notes (Signed)
NPO AFTER MN. ARRIVE AT 0715. NEEDS ISTAT AND EKG. WILL TAKE NORVASC AM OF SURG W/ SIP OF WATER AND IF ABLE TO TAKE ANTIBIOTIC, AS DIRECTED BY DR Vernie Ammons, WITHOUT FOOD HE WILL TAKE THIS, ALSO. WILL DO FLEET ENEMA AM OF SURG.

## 2013-06-19 NOTE — H&P (Signed)
History of Present Illness  He BPH with outlet obstruction: He has had urinary retention in the past. He reports that he has never had prostate surgery before. Current treatment: Finasteride & tamsulosin   Elevated PSA: He reports having undergone 4 previous TRUS/BXs in the past. The last one was in 2000. In 1/12 his PSA was noted to be elevated however he refused a prostate biopsy at that time.  History of urinary retention: He developed retention in 2012. On 01/19/13 he was found to have 800 cc in his bladder. He then passed his voiding trial and I placed him on finasteride. Treatment: Finasteride & tamsulosin  Interval history: He was found to have elevated PSA of 10.03 in 7/14. He is voiding without difficulty and tells me that he does not get up at all at night now.   Past Medical History Problems  1. History of  Acute Urinary Retention 788.20 2. History of  Arthritis V13.4 3. History of  Emphysema 492.8 4. History of  External Hemorrhoids 455.3 5. History of  Hyperlipidemia 272.4 6. History of  Hypertension 401.9  Surgical History Problems  1. History of  Hernia Repair Bilateral  Current Meds 1. AmLODIPine Besylate 5 MG Oral Tablet; Therapy: (Recorded:10Apr2014) to 2. Finasteride 5 MG Oral Tablet; Take 1 tablet by mouth every day; Therapy: 10Apr2014 to  (Evaluate:05Apr2015)  Requested for: 10Apr2014; Last Rx:10Apr2014 3. Lisinopril 5 MG Oral Tablet; Therapy: (Recorded:10Apr2014) to 4. Prostate Support TABS; Therapy: (Recorded:10Apr2014) to 5. Tamsulosin HCl 0.4 MG Oral Capsule; Therapy: (Recorded:10Apr2014) to  Family History Problems  1. Family history of  Brain Cancer V16.8 2. Family history of  Heart Disease V17.49  Social History Problems  1. Alcohol Use 2. Caffeine Use 3. Former Smoker V15.82 2ppdx15 years ago quit back in 1980 4. Marital History - Widowed  Review of Systems Genitourinary, constitutional, skin, eye, otolaryngeal, hematologic/lymphatic,  cardiovascular, pulmonary, endocrine, musculoskeletal, gastrointestinal, neurological and psychiatric system(s) were reviewed and pertinent findings if present are noted.  Genitourinary: urinary frequency, feelings of urinary urgency, dysuria, incontinence, difficulty starting the urinary stream, weak urinary stream, urinary stream starts and stops, hematuria, penile pain and initiating urination requires straining, but no nocturia.  Gastrointestinal: heartburn and constipation.  Respiratory: shortness of breath and cough.  Musculoskeletal: back pain and joint pain.  Neurological: headache.    Vitals Vital Signs  Height: 5 ft 8 in Blood Pressure: 155 / 74 Temperature: 96.9 F Heart Rate: 58  Physical Exam Constitutional: Well nourished and well developed . No acute distress.  ENT:. The ears and nose are normal in appearance.  Neck: The appearance of the neck is normal and no neck mass is present.  Pulmonary: No respiratory distress and normal respiratory rhythm and effort.  Cardiovascular: Heart rate and rhythm are normal . No peripheral edema.  Abdomen: The abdomen is soft and nontender. No masses are palpated. No CVA tenderness. No hernias are palpable. No hepatosplenomegaly noted.  Rectal: Rectal exam demonstrates an external hemorrhoid, but normal sphincter tone, no tenderness and no masses. Estimated prostate size is 3+. He does have a mild rectal stricture. The prostate has no nodularity and is not tender. The left seminal vesicle is nonpalpable. The right seminal vesicle is nonpalpable. The perineum is normal on inspection.  Genitourinary: Examination of the penis demonstrates an indwelling catheter, but no discharge, no masses, no lesions and a normal meatus. The penis is circumcised. The scrotum is without lesions. The right epididymis is palpably normal and non-tender. The left epididymis is  palpably normal and non-tender. The right testis is non-tender and without masses. The left  testis is non-tender and without masses.  Lymphatics: The femoral and inguinal nodes are not enlarged or tender.  Skin: Normal skin turgor, no visible rash and no visible skin lesions.  Neuro/Psych:. Mood and affect are appropriate.   Assessment Assessed  1. Rectal Stricture 569.2 2. PSA,Elevated 790.93 3. Benign Prostatic Hypertrophy With Urinary Obstruction 600.01 4. Possible  Prostate Hard Area Or Nodule Right 600.10         I have discussed with the patient the need for further evaluation of his elevated PSA. I told him that a PSA of 10.03 in a patient on finasteride when corrected for the effects of this medication is actually 20.06.  We have discussed the options which would be continued observation with serial DRE and PSAs versus proceeding with further evaluation at this time with TRUS/Bx. We have discussed the possible risk of progression and spread of prostate cancer if present currently as well as the fact that typically prostate cancer tends to be a relatively slow-growing form of cancer that typically would not progress significantly over the relative short period of time between serial examinations. We also have discussed proceeding at this time with a prostate biopsy and I therefore have gone over the procedure with him in detail. We have discussed its potential risks and complications as well as limitations. I have answered all of his questions regarding this procedure to his satisfaction and he has elected to proceed with a prostate biopsy at this time.   Because he has a mild rectal stricture and his DRE today was very uncomfortable for him I have recommended performing his prostate biopsy under anesthesia   Plan     I am going to proceed with TRUS/BX under anesthesia because of his rectal stricture.

## 2013-06-19 NOTE — Anesthesia Preprocedure Evaluation (Addendum)
Anesthesia Evaluation  Patient identified by MRN, date of birth, ID band Patient awake    Reviewed: Allergy & Precautions, H&P , NPO status , Patient's Chart, lab work & pertinent test results  Airway Mallampati: II TM Distance: >3 FB Neck ROM: full    Dental no notable dental hx. (+) Edentulous Upper, Missing and Dental Advisory Given All front lower teeth missing too:   Pulmonary COPD breath sounds clear to auscultation  Pulmonary exam normal       Cardiovascular Exercise Tolerance: Good hypertension, Pt. on medications Rhythm:regular Rate:Normal     Neuro/Psych negative neurological ROS  negative psych ROS   GI/Hepatic negative GI ROS, Neg liver ROS,   Endo/Other  negative endocrine ROS  Renal/GU negative Renal ROS  negative genitourinary   Musculoskeletal   Abdominal   Peds  Hematology negative hematology ROS (+)   Anesthesia Other Findings   Reproductive/Obstetrics negative OB ROS                          Anesthesia Physical Anesthesia Plan  ASA: III  Anesthesia Plan: MAC   Post-op Pain Management:    Induction:   Airway Management Planned: Simple Face Mask  Additional Equipment:   Intra-op Plan:   Post-operative Plan:   Informed Consent: I have reviewed the patients History and Physical, chart, labs and discussed the procedure including the risks, benefits and alternatives for the proposed anesthesia with the patient or authorized representative who has indicated his/her understanding and acceptance.   Dental Advisory Given  Plan Discussed with: CRNA and Surgeon  Anesthesia Plan Comments:        Anesthesia Quick Evaluation

## 2013-06-20 ENCOUNTER — Encounter (HOSPITAL_COMMUNITY): Payer: Self-pay | Admitting: Emergency Medicine

## 2013-06-20 ENCOUNTER — Encounter (HOSPITAL_BASED_OUTPATIENT_CLINIC_OR_DEPARTMENT_OTHER): Payer: Self-pay

## 2013-06-20 ENCOUNTER — Emergency Department (HOSPITAL_COMMUNITY)
Admission: EM | Admit: 2013-06-20 | Discharge: 2013-06-20 | Discharge status: Against Medical Advice | Payer: Medicare Other | Source: Home / Self Care

## 2013-06-20 ENCOUNTER — Ambulatory Visit (HOSPITAL_BASED_OUTPATIENT_CLINIC_OR_DEPARTMENT_OTHER): Payer: Medicare Other | Admitting: Anesthesiology

## 2013-06-20 ENCOUNTER — Ambulatory Visit (HOSPITAL_BASED_OUTPATIENT_CLINIC_OR_DEPARTMENT_OTHER)
Admission: RE | Admit: 2013-06-20 | Discharge: 2013-06-20 | Disposition: A | Discharge status: Home / Self Care | Payer: Medicare Other | Source: Ambulatory Visit | Attending: Urology | Admitting: Urology

## 2013-06-20 ENCOUNTER — Encounter (HOSPITAL_BASED_OUTPATIENT_CLINIC_OR_DEPARTMENT_OTHER)
Admission: RE | Disposition: A | Discharge status: Home / Self Care | Payer: Self-pay | Source: Ambulatory Visit | Attending: Urology

## 2013-06-20 ENCOUNTER — Encounter (HOSPITAL_BASED_OUTPATIENT_CLINIC_OR_DEPARTMENT_OTHER): Payer: Self-pay | Admitting: Anesthesiology

## 2013-06-20 ENCOUNTER — Emergency Department (HOSPITAL_COMMUNITY)
Admission: EM | Admit: 2013-06-20 | Discharge: 2013-06-20 | Disposition: A | Discharge status: Home / Self Care | Payer: Medicare Other | Attending: Emergency Medicine | Admitting: Emergency Medicine

## 2013-06-20 DIAGNOSIS — Z87891 Personal history of nicotine dependence: Secondary | ICD-10-CM | POA: Insufficient documentation

## 2013-06-20 DIAGNOSIS — N138 Other obstructive and reflux uropathy: Secondary | ICD-10-CM | POA: Insufficient documentation

## 2013-06-20 DIAGNOSIS — N4289 Other specified disorders of prostate: Secondary | ICD-10-CM | POA: Insufficient documentation

## 2013-06-20 DIAGNOSIS — N139 Obstructive and reflux uropathy, unspecified: Secondary | ICD-10-CM | POA: Insufficient documentation

## 2013-06-20 DIAGNOSIS — I10 Essential (primary) hypertension: Secondary | ICD-10-CM | POA: Insufficient documentation

## 2013-06-20 DIAGNOSIS — Z8639 Personal history of other endocrine, nutritional and metabolic disease: Secondary | ICD-10-CM | POA: Insufficient documentation

## 2013-06-20 DIAGNOSIS — N401 Enlarged prostate with lower urinary tract symptoms: Secondary | ICD-10-CM | POA: Insufficient documentation

## 2013-06-20 DIAGNOSIS — J449 Chronic obstructive pulmonary disease, unspecified: Secondary | ICD-10-CM | POA: Insufficient documentation

## 2013-06-20 DIAGNOSIS — J4489 Other specified chronic obstructive pulmonary disease: Secondary | ICD-10-CM | POA: Insufficient documentation

## 2013-06-20 DIAGNOSIS — Z8739 Personal history of other diseases of the musculoskeletal system and connective tissue: Secondary | ICD-10-CM | POA: Insufficient documentation

## 2013-06-20 DIAGNOSIS — Z9889 Other specified postprocedural states: Secondary | ICD-10-CM | POA: Insufficient documentation

## 2013-06-20 DIAGNOSIS — K624 Stenosis of anus and rectum: Secondary | ICD-10-CM | POA: Insufficient documentation

## 2013-06-20 DIAGNOSIS — Z79899 Other long term (current) drug therapy: Secondary | ICD-10-CM | POA: Insufficient documentation

## 2013-06-20 DIAGNOSIS — R338 Other retention of urine: Secondary | ICD-10-CM

## 2013-06-20 DIAGNOSIS — Z792 Long term (current) use of antibiotics: Secondary | ICD-10-CM | POA: Insufficient documentation

## 2013-06-20 DIAGNOSIS — Z862 Personal history of diseases of the blood and blood-forming organs and certain disorders involving the immune mechanism: Secondary | ICD-10-CM | POA: Insufficient documentation

## 2013-06-20 DIAGNOSIS — J438 Other emphysema: Secondary | ICD-10-CM | POA: Insufficient documentation

## 2013-06-20 DIAGNOSIS — R109 Unspecified abdominal pain: Secondary | ICD-10-CM | POA: Insufficient documentation

## 2013-06-20 DIAGNOSIS — E785 Hyperlipidemia, unspecified: Secondary | ICD-10-CM | POA: Insufficient documentation

## 2013-06-20 DIAGNOSIS — R339 Retention of urine, unspecified: Secondary | ICD-10-CM | POA: Insufficient documentation

## 2013-06-20 DIAGNOSIS — G8929 Other chronic pain: Secondary | ICD-10-CM | POA: Insufficient documentation

## 2013-06-20 DIAGNOSIS — K644 Residual hemorrhoidal skin tags: Secondary | ICD-10-CM | POA: Insufficient documentation

## 2013-06-20 DIAGNOSIS — Z880 Allergy status to penicillin: Secondary | ICD-10-CM | POA: Insufficient documentation

## 2013-06-20 DIAGNOSIS — Z8719 Personal history of other diseases of the digestive system: Secondary | ICD-10-CM | POA: Insufficient documentation

## 2013-06-20 DIAGNOSIS — R972 Elevated prostate specific antigen [PSA]: Secondary | ICD-10-CM

## 2013-06-20 HISTORY — DX: Benign prostatic hyperplasia with lower urinary tract symptoms: N40.1

## 2013-06-20 HISTORY — DX: Other obstructive and reflux uropathy: N13.8

## 2013-06-20 HISTORY — PX: PROSTATE BIOPSY: SHX241

## 2013-06-20 HISTORY — DX: Other chronic pain: G89.29

## 2013-06-20 HISTORY — DX: Dorsalgia, unspecified: M54.9

## 2013-06-20 LAB — URINALYSIS, ROUTINE W REFLEX MICROSCOPIC
Bilirubin Urine: NEGATIVE
Glucose, UA: NEGATIVE mg/dL
Ketones, ur: NEGATIVE mg/dL
Leukocytes, UA: NEGATIVE
Nitrite: NEGATIVE
Protein, ur: NEGATIVE mg/dL
Specific Gravity, Urine: 1.014 (ref 1.005–1.030)
Urobilinogen, UA: 0.2 mg/dL (ref 0.0–1.0)
pH: 7 (ref 5.0–8.0)

## 2013-06-20 LAB — POCT I-STAT 4, (NA,K, GLUC, HGB,HCT)
Glucose, Bld: 104 mg/dL — ABNORMAL HIGH (ref 70–99)
HCT: 45 % (ref 39.0–52.0)
Hemoglobin: 15.3 g/dL (ref 13.0–17.0)
Potassium: 3.8 mEq/L (ref 3.5–5.1)
Sodium: 141 mEq/L (ref 135–145)

## 2013-06-20 LAB — URINE MICROSCOPIC-ADD ON

## 2013-06-20 SURGERY — BIOPSY, PROSTATE
Anesthesia: Monitor Anesthesia Care | Site: Prostate | Wound class: Clean Contaminated

## 2013-06-20 MED ORDER — FENTANYL CITRATE 0.05 MG/ML IJ SOLN
25.0000 ug | INTRAMUSCULAR | Status: DC | PRN
  Filled 2013-06-20: qty 1

## 2013-06-20 MED ORDER — ONDANSETRON HCL 4 MG/2ML IJ SOLN
INTRAMUSCULAR | Status: DC | PRN
  Administered 2013-06-20: 4 mg via INTRAVENOUS

## 2013-06-20 MED ORDER — PROPOFOL 10 MG/ML IV BOLUS
INTRAVENOUS | Status: DC | PRN
  Administered 2013-06-20 (×5): 20 mg via INTRAVENOUS

## 2013-06-20 MED ORDER — MIDAZOLAM HCL 5 MG/5ML IJ SOLN
INTRAMUSCULAR | Status: DC | PRN
  Administered 2013-06-20: 0.5 mg via INTRAVENOUS

## 2013-06-20 MED ORDER — LIDOCAINE HCL (CARDIAC) 20 MG/ML IV SOLN
INTRAVENOUS | Status: DC | PRN
  Administered 2013-06-20: 50 mg via INTRAVENOUS

## 2013-06-20 MED ORDER — LACTATED RINGERS IV SOLN
INTRAVENOUS | Status: DC
  Filled 2013-06-20: qty 1000

## 2013-06-20 MED ORDER — DEXAMETHASONE SODIUM PHOSPHATE 4 MG/ML IJ SOLN
INTRAMUSCULAR | Status: DC | PRN
  Administered 2013-06-20: 4 mg via INTRAVENOUS

## 2013-06-20 MED ORDER — CIPROFLOXACIN IN D5W 400 MG/200ML IV SOLN
400.0000 mg | INTRAVENOUS | Status: AC
  Administered 2013-06-20: 400 mg via INTRAVENOUS
  Filled 2013-06-20: qty 200

## 2013-06-20 MED ORDER — FENTANYL CITRATE 0.05 MG/ML IJ SOLN
INTRAMUSCULAR | Status: DC | PRN
  Administered 2013-06-20: 50 ug via INTRAVENOUS

## 2013-06-20 MED ORDER — GLYCOPYRROLATE 0.2 MG/ML IJ SOLN
INTRAMUSCULAR | Status: DC | PRN
  Administered 2013-06-20: 0.2 mg via INTRAVENOUS

## 2013-06-20 MED ORDER — LACTATED RINGERS IV SOLN
INTRAVENOUS | Status: DC
  Administered 2013-06-20 (×2): via INTRAVENOUS
  Filled 2013-06-20: qty 1000

## 2013-06-20 SURGICAL SUPPLY — 7 items
GLOVE BIO SURGEON STRL SZ8 (GLOVE) ×2 IMPLANT
NDL SAFETY ECLIPSE 18X1.5 (NEEDLE) IMPLANT
NEEDLE HYPO 18GX1.5 SHARP (NEEDLE)
NEEDLE SPNL 22GX7 QUINCKE BK (NEEDLE) IMPLANT
SURGILUBE 2OZ TUBE FLIPTOP (MISCELLANEOUS) ×2 IMPLANT
SYR CONTROL 10ML LL (SYRINGE) IMPLANT
UNDERPAD 30X30 INCONTINENT (UNDERPADS AND DIAPERS) ×4 IMPLANT

## 2013-06-20 NOTE — ED Notes (Signed)
MD at bedside. 

## 2013-06-20 NOTE — ED Provider Notes (Signed)
CSN: [id number]     Arrival date & time 06/20/13  2001 History   First MD Initiated Contact with Patient 06/20/13 2013     Chief Complaint  Patient presents with  . Urinary Retention   (Consider location/radiation/quality/duration/timing/severity/associated sxs/prior Treatment) HPI  79 year old male with urinary retention. Patient had a prostate biopsy earlier today. He's been having increasing suprapubic discomfort since then and has been unable to void. He has a past history of urinary retention states that his current symptoms feel very similar. No hematuria. No back pain. No nausea vomiting. No fevers or chills.   Past Medical History  Diagnosis Date  . H/O prostate biopsy   . Elevated PSA 10/16/2010  . Hyperlipidemia   . Hypertension   . COPD (chronic obstructive pulmonary disease)   . Arthritis   . Hemorrhoids, external   . BPH (benign prostatic hypertrophy) with urinary obstruction   . Back pain, chronic    Past Surgical History  Procedure Laterality Date  . Fracture surgery  1959    Work accident-caused FX whole body expect rt arm,was in body cast- was working on roof- hosp for 5 mos.  . Inguinal hernia repair Bilateral 1983   Family History  Problem Relation Age of Onset  . CVA Mother   . Cancer Father     brain tumor   History  Substance Use Topics  . Smoking status: Former Smoker -- 1.00 packs/day for 30 years    Types: Cigarettes    Quit date: 06/09/2005  . Smokeless tobacco: Never Used  . Alcohol Use: Yes     Comment: OCCASIONAL    Review of Systems  All systems reviewed and negative, other than as noted in HPI.   Allergies  Penicillins  Home Medications   Current Outpatient Rx  Name  Route  Sig  Dispense  Refill  . amLODipine (NORVASC) 5 MG tablet   Oral   Take 5 mg by mouth every morning.         . finasteride (PROSCAR) 5 MG tablet   Oral   Take 1 tablet (5 mg total) by mouth daily.   30 tablet   5   . levofloxacin (LEVAQUIN) 500 MG  tablet   Oral   Take 500 mg by mouth daily.         Marland Kitchen lisinopril (PRINIVIL,ZESTRIL) 5 MG tablet   Oral   Take 5 mg by mouth every morning. TAKE 1 TABLET BY MOUTH DAILY         . tamsulosin (FLOMAX) 0.4 MG CAPS   Oral   Take 1 capsule (0.4 mg total) by mouth daily.   30 capsule   5    BP 150/71  Pulse 47  Temp(Src) 98.3 F (36.8 C) (Oral)  Resp 20  SpO2 91% Physical Exam  Nursing note and vitals reviewed. Constitutional: He appears well-developed and well-nourished. No distress.  HENT:  Head: Normocephalic and atraumatic.  Eyes: Conjunctivae are normal. Right eye exhibits no discharge. Left eye exhibits no discharge.  Neck: Neck supple.  Cardiovascular: Normal rate, regular rhythm and normal heart sounds.  Exam reveals no gallop and no friction rub.   No murmur heard. Pulmonary/Chest: Effort normal and breath sounds normal. No respiratory distress.  Abdominal: Soft. He exhibits no distension. There is tenderness.  Suprapubic tenderness.  Genitourinary:  No CVA tenderness  Musculoskeletal: He exhibits no edema and no tenderness.  Neurological: He is alert.  Skin: Skin is warm and dry.  Psychiatric: He has a  normal mood and affect. His behavior is normal. Thought content normal.    ED Course  Procedures (including critical care time) Labs Review Labs Reviewed  URINALYSIS, ROUTINE W REFLEX MICROSCOPIC - Abnormal; Notable for the following:    Hgb urine dipstick TRACE (*)    All other components within normal limits  URINE MICROSCOPIC-ADD ON   Imaging Review No results found.  MDM   1. Acute urinary retention    79 year old male with urinary retention after a prostate biopsy. The catheter was placed with relief of symptoms and output of . Urinalysis is fairly unremarkable. The patient be discharged w/ leg bag with outpatient urology followup.    Raeford Razor, MD 06/25/13 1736

## 2013-06-20 NOTE — ED Notes (Signed)
PT presents to triage desk ambulatory with family, demanding to be seen immediately. Requesting catheter be placed for urinary retention. Calmly explained to PT that a physician would need to see PT first before interventions. PT and family state they do not want to wait to be seen and are leaving. Advised PT about leaving AMA

## 2013-06-20 NOTE — Interval H&P Note (Signed)
History and Physical Interval Note:  06/20/2013 8:18 AM  Kenneth Woods  has presented today for surgery, with the diagnosis of Elevated Prostatic Specific Antigen  The various methods of treatment have been discussed with the patient and family. After consideration of risks, benefits and other options for treatment, the patient has consented to  Procedure(s) with comments: PROSTATE BIOPSY (N/A) - 1 hour req for this case  ULTRASOUND EQUIPHMENT TO BE PROVIDED BY [facility] as a surgical intervention .  The patient's history has been reviewed, patient examined, no change in status, stable for surgery.  I have reviewed the patient's chart and labs.  Questions were answered to the patient's satisfaction.     Garnett Farm

## 2013-06-20 NOTE — ED Notes (Signed)
Pt. reports urinary retention with bladder pressure onset today .

## 2013-06-20 NOTE — Op Note (Signed)
PATIENT:  Kenneth Woods  PRE-OPERATIVE DIAGNOSIS: Elevated PSA  POST-OPERATIVE DIAGNOSIS: Same  PROCEDURE: TRUS with BX  SURGEON:  Garnett Farm  INDICATION: Kenneth Woods is a [name] is a 79 year old male with a PSA of 10.03. He has undergone 4 previous biopsies however the last one was in 2000. I found no worrisome nodularity on DRE. I was able to palpate, on DRE today, an area in the posterior portion of the right lobe that felt as if it may have been a small nodule however it may have been the seminal vesicle. He did however have a moderate rectal stricture.  ANESTHESIA:  General  EBL:  Minimal  DRAINS: None  LOCAL MEDICATIONS USED:  None  SPECIMEN:  Prostate biopsy cores to pathology  Description of procedure: After informed consent the patient was brought to the major or, placed on the table and administered general anesthesia in the supine position. He was then moved to the lateral decubitus position. An official timeout was then performed.  Examination under anesthesia revealed a rectal stricture as well as an external hemorrhoid. I was able to feel some slight nodularity at the base of his prostate on the right-hand side that was worrisome. The prostate was noted to be 2-3+ enlarged.  The transrectal ultrasound probe was then inserted in the rectum and the prostate was fully visualized in both the sagittal and axial planes. I noted hypertrophy of the transition zone with no worrisome hypoechoic lesions. The capsule appeared intact throughout. The prostate measured 6.83 cm long, 6.73 cm high and 6.2 cm wide for a prostate volume of 149.27. He had a large median lobe component.  12 cores were obtained from the prostate in the standard fashion. This proceeded without significant bleeding or other complication. It was performed under direct ultrasound guidance.  The transrectal ultrasound probe was removed and no significant bleeding was noted. The patient tolerated the  procedure well no intraoperative complications. He will followup with me in one week to discuss results of his prostate biopsy.  PLAN OF CARE: Discharge to home after PACU  PATIENT DISPOSITION:  PACU - hemodynamically stable.

## 2013-06-20 NOTE — ED Notes (Signed)
Pt. Came in with complaint of unable to urinate which started at 2pm this afternoon. Pt. Claimed that he had biopsy of his prostate here this morning and noticed that his been having less urine coming out. Pain at 5/10.

## 2013-06-20 NOTE — Transfer of Care (Signed)
Immediate Anesthesia Transfer of Care Note  Patient: Kenneth Woods  Procedure(s) Performed: Procedure(s) (LRB): PROSTATE BIOPSY (N/A)  Patient Location: PACU  Anesthesia Type: MAC  Level of Consciousness: awake, alert  and oriented  Airway & Oxygen Therapy: Patient Spontanous Breathing and Patient connected to face mask oxygen  Post-op Assessment: Report given to PACU RN and Post -op Vital signs reviewed and stable  Post vital signs: Reviewed and stable  Complications: No apparent anesthesia complications

## 2013-06-20 NOTE — Anesthesia Postprocedure Evaluation (Signed)
  Anesthesia Post-op Note  Patient: Kenneth Woods  Procedure(s) Performed: Procedure(s) (LRB): PROSTATE BIOPSY (N/A)  Patient Location: PACU  Anesthesia Type: MAC  Level of Consciousness: awake and alert   Airway and Oxygen Therapy: Patient Spontanous Breathing  Post-op Pain: mild  Post-op Assessment: Post-op Vital signs reviewed, Patient's Cardiovascular Status Stable, Respiratory Function Stable, Patent Airway and No signs of Nausea or vomiting  Last Vitals:  Filed Vitals:   06/20/13 0930  BP: 116/46  Pulse: 60  Temp:   Resp: 20    Post-op Vital Signs: stable   Complications: No apparent anesthesia complications

## 2013-06-24 ENCOUNTER — Encounter (HOSPITAL_BASED_OUTPATIENT_CLINIC_OR_DEPARTMENT_OTHER): Payer: Self-pay | Admitting: Urology

## 2013-07-07 ENCOUNTER — Encounter: Payer: Self-pay | Admitting: Physician Assistant

## 2013-10-10 ENCOUNTER — Other Ambulatory Visit: Payer: Self-pay | Admitting: Physician Assistant

## 2013-10-10 NOTE — Telephone Encounter (Signed)
Medication refilled per protocol. 

## 2013-10-27 ENCOUNTER — Ambulatory Visit: Payer: Medicare Other | Admitting: Physician Assistant

## 2013-10-28 ENCOUNTER — Encounter: Payer: Self-pay | Admitting: Physician Assistant

## 2013-10-28 ENCOUNTER — Ambulatory Visit (INDEPENDENT_AMBULATORY_CARE_PROVIDER_SITE_OTHER): Payer: Medicare Other | Admitting: Physician Assistant

## 2013-10-28 VITALS — BP 162/94 | HR 60 | Temp 97.6°F | Resp 18 | Ht 62.75 in | Wt 178.0 lb

## 2013-10-28 DIAGNOSIS — M1990 Unspecified osteoarthritis, unspecified site: Secondary | ICD-10-CM

## 2013-10-28 DIAGNOSIS — J449 Chronic obstructive pulmonary disease, unspecified: Secondary | ICD-10-CM

## 2013-10-28 DIAGNOSIS — E785 Hyperlipidemia, unspecified: Secondary | ICD-10-CM

## 2013-10-28 DIAGNOSIS — M129 Arthropathy, unspecified: Secondary | ICD-10-CM

## 2013-10-28 DIAGNOSIS — R339 Retention of urine, unspecified: Secondary | ICD-10-CM

## 2013-10-28 DIAGNOSIS — R972 Elevated prostate specific antigen [PSA]: Secondary | ICD-10-CM

## 2013-10-28 DIAGNOSIS — I10 Essential (primary) hypertension: Secondary | ICD-10-CM

## 2013-10-28 DIAGNOSIS — N400 Enlarged prostate without lower urinary tract symptoms: Secondary | ICD-10-CM

## 2013-10-28 DIAGNOSIS — Z23 Encounter for immunization: Secondary | ICD-10-CM

## 2013-10-28 LAB — BASIC METABOLIC PANEL WITH GFR
BUN: 13 mg/dL (ref 6–23)
CO2: 32 mEq/L (ref 19–32)
Calcium: 9.6 mg/dL (ref 8.4–10.5)
Chloride: 103 mEq/L (ref 96–112)
Creat: 0.81 mg/dL (ref 0.50–1.35)
GFR, Est African American: >89 mL/min
GFR, Est Non African American: 86 mL/min
Glucose, Bld: 82 mg/dL (ref 70–99)
Potassium: 3.9 mEq/L (ref 3.5–5.3)
Sodium: 142 mEq/L (ref 135–145)

## 2013-10-28 MED ORDER — LISINOPRIL 20 MG PO TABS: 20.0000 mg | ORAL_TABLET | Freq: Every day | ORAL | Status: DC

## 2013-10-28 NOTE — Progress Notes (Signed)
Patient ID: Kenneth Woods MRN: [id number], DOB: [birth year], 80 y.o. Date of Encounter: @DATE @  Chief Complaint:  Chief Complaint  Patient presents with  . 6 mth check up    not fasting, med refills    HPI: 80 y.o. year old white male  presents for routine followup office visit.  He says he's been feeling good. Says he hasn't been sick at all. He still climbs and moves around a lot. 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   Says that he has been following up with urology and "everything has turned out just fine." Had a biopsy which was normal. Is taking some medication by urology and this is controlling his symptoms completely. He is taking both of his blood pressure medications as directed. No adverse effects.  Even with exertion he is having no angina symptoms.  Says that he is thinking about getting married again. Says that both he and his friend are both widowed. Says that they have been "friends for a long time especially since they both lost her spouses. Asked if he would buy her ring and all of that and he says he's thinking about it but can't decide.   Past Medical History  Diagnosis Date  . H/O prostate biopsy   . Elevated PSA 10/16/2010  . Hyperlipidemia   . Hypertension   . COPD (chronic obstructive pulmonary disease)   . Arthritis   . Hemorrhoids, external   . BPH (benign prostatic hypertrophy) with urinary obstruction   . Back pain, chronic      Home Meds: See attached medication section for current medication list. Any medications entered into computer today will not appear on this note's list. The medications listed below were entered prior to today. Current Outpatient Prescriptions on File Prior to Visit  Medication Sig Dispense Refill  . amLODipine (NORVASC) 5 MG tablet Take 5 mg by mouth every  morning.      . finasteride (PROSCAR) 5 MG tablet Take 1 tablet (5 mg total) by mouth daily.  30 tablet  5  . tamsulosin (FLOMAX) 0.4 MG CAPS Take 1 capsule (0.4 mg total) by mouth daily.  30 capsule  5   No current facility-administered medications on file prior to visit.    Allergies:  Allergies  Allergen Reactions  . Penicillins Anaphylaxis    History   Social History  . Marital Status: Widowed    Spouse Name: N/A    Number of Children: N/A  . Years of Education: N/A   Occupational History  . Not on file.   Social History Main Topics  . Smoking status: Former Smoker -- 1.00 packs/day for 30 years    Types: Cigarettes    Quit date: 06/09/2005  . Smokeless tobacco: Never Used  . Alcohol Use: Yes     Comment: OCCASIONAL  . Drug Use: No  . Sexual Activity: Not on file   Other Topics Concern  . Not on file   Social History Narrative  . No narrative on file    Family History  Problem Relation Age of Onset  . CVA Mother   . Cancer Father     brain tumor     Review of Systems:  See HPI for pertinent ROS. All other ROS negative.  Physical Exam: Blood pressure 162/94, pulse 60, temperature 97.6 F (36.4 C), temperature source Oral, resp. rate 18, height 5' 2.75" (1.594 m), weight 178 lb (80.74 kg)., Body mass index is 31.78 kg/(m^2).  I will check his blood pressure myself on both arms. On The Left, I  got 152/84x2. On the right I got 160/80x2. General:WNWD WM Appears in no acute distress. Neck: Supple. No thyromegaly. No lymphadenopathy. No carotid bruits. Lungs: Clear bilaterally to auscultation without wheezes, rales, or rhonchi. Breathing is unlabored. Heart: RRR with S1 S2. No murmurs, rubs, or gallops. Abdomen: Soft, non-tender, non-distended with normoactive bowel sounds. No hepatomegaly. No rebound/guarding. No obvious abdominal masses. Musculoskeletal:  Strength and tone normal for age. Extremities/Skin: Warm and dry. No clubbing or cyanosis. No edema.  No rashes or suspicious lesions. Neuro: Alert and oriented X 3. Moves all extremities spontaneously. Gait is normal. CNII-XII grossly in tact. Psych:  Responds to questions appropriately with a normal affect.     ASSESSMENT AND PLAN:  80 y.o. year old male with  1. Hypertension Blood pressure is elevated.  He currently still has some lisinopril 5 mg pills and his current bottle. The next 2-3 days he is to take 2 of these 5 mg pills to equal 10 mg. Then he will taking those pills and start the lisinopril 20 mg pills. He has no lower stomach edema. He is on Norvasc 5 mg here He is to return to clinic in 2 weeks to recheck blood pressure and BMET on new dose. - BASIC METABOLIC PANEL WITH GFR - lisinopril (PRINIVIL,ZESTRIL) 20 MG tablet; Take 1 tablet (20 mg total) by mouth daily.  Dispense: 90 tablet; Refill: 3  2. Need for prophylactic vaccination and inoculation against influenza - Flu Vaccine QUAD 36+ mos PF IM (Fluarix)  3. Arthritis  4. BPH with elevated PSA Per Urology  5. COPD (chronic obstructive pulmonary disease) Discussed with patient again today. He still reports he is having no difficulty with his breathing even since he has stopped the medication. He had PFTs in the past consistent with stage II emphysema. He was on Zocor in the past. He stopped this and continues to report his breathing is stable off meds. He quit smoking in 2003. 40 pack year history.   7. Hyperlipidemia Lipid panel was within normal limits January 2013.  8.H/O - Retention of urine, unspecified--perurology  9. colonoscopy: Per patient he had this in 2003. He refuses followup. He is aware of risk versus benefit.  10. Immunizations: Receive influenza vaccine today. He was given Pneumovax 11/04/2012. I discussed given the Prevnar 13 today but we are currently out and this has been ordered. Past with him that we can give him the Prevnar when he returns to clinic in 2 weeks.    Signed, [address]  [place], [place], [facility] 10/28/2013 9:51 AM

## 2013-11-12 ENCOUNTER — Ambulatory Visit (INDEPENDENT_AMBULATORY_CARE_PROVIDER_SITE_OTHER): Payer: Medicare Other | Admitting: Physician Assistant

## 2013-11-12 ENCOUNTER — Encounter: Payer: Self-pay | Admitting: Physician Assistant

## 2013-11-12 VITALS — BP 144/80 | HR 60 | Temp 97.0°F | Resp 18 | Ht 64.75 in | Wt 182.0 lb

## 2013-11-12 DIAGNOSIS — Z23 Encounter for immunization: Secondary | ICD-10-CM

## 2013-11-12 DIAGNOSIS — I10 Essential (primary) hypertension: Secondary | ICD-10-CM

## 2013-11-12 LAB — BASIC METABOLIC PANEL WITH GFR
BUN: 12 mg/dL (ref 6–23)
CO2: 26 mEq/L (ref 19–32)
Calcium: 9.5 mg/dL (ref 8.4–10.5)
Chloride: 103 mEq/L (ref 96–112)
Creat: 0.83 mg/dL (ref 0.50–1.35)
GFR, Est African American: >89 mL/min
GFR, Est Non African American: 85 mL/min
Glucose, Bld: 105 mg/dL — ABNORMAL HIGH (ref 70–99)
Potassium: 4.5 mEq/L (ref 3.5–5.3)
Sodium: 137 mEq/L (ref 135–145)

## 2013-11-12 MED ORDER — LISINOPRIL 20 MG PO TABS: 20.0000 mg | ORAL_TABLET | Freq: Every day | ORAL | Status: DC

## 2013-11-12 NOTE — Progress Notes (Signed)
    Patient ID: Kenneth StagersCecil R Woods MRN: [id number], DOB: 08-29-1934, 80 y.o. Date of Encounter: 11/12/2013, 11:12 AM    Chief Complaint:  Chief Complaint  Patient presents with  . 2 week follow up    and get Prevnar13     HPI: 80 y.o. year old white male is here for followup of hypertension.  He recently saw me on 10/28/13 for a routine six-month followup visit. His blood pressure was found to be elevated. I had him gradually increase his lisinopril from 5 mg up to 20 mg daily. He states that he is now up to 20 mg and is tolerating this well. No lightheadedness or other adverse effects.     Home Meds: See attached medication section for any medications that were entered at today's visit. The computer does not put those onto this list.The following list is a list of meds entered prior to today's visit.   Current Outpatient Prescriptions on File Prior to Visit  Medication Sig Dispense Refill  . amLODipine (NORVASC) 5 MG tablet Take 5 mg by mouth every morning.      . finasteride (PROSCAR) 5 MG tablet Take 1 tablet (5 mg total) by mouth daily.  30 tablet  5  . tamsulosin (FLOMAX) 0.4 MG CAPS Take 1 capsule (0.4 mg total) by mouth daily.  30 capsule  5   No current facility-administered medications on file prior to visit.    Allergies:  Allergies  Allergen Reactions  . Penicillins Anaphylaxis      Review of Systems: See HPI for pertinent ROS. All other ROS negative.    Physical Exam: Blood pressure 144/80, pulse 60, temperature 97 F (36.1 C), temperature source Oral, resp. rate 18, height 5' 4.75" (1.645 m), weight 182 lb (82.555 kg)., Body mass index is 30.51 kg/(m^2). General: WNWD WM. Appears in no acute distress. Neck: Supple. No thyromegaly. No lymphadenopathy. No carotid bruits. Lungs: Clear bilaterally to auscultation without wheezes, rales, or rhonchi. Breathing is unlabored. Heart: Regular rhythm. No murmurs, rubs, or gallops. Msk:  Strength and tone normal for  age. Extremities/Skin: Warm and dry. No clubbing or cyanosis. No edema. No rashes or suspicious lesions. Neuro: Alert and oriented X 3. Moves all extremities spontaneously. Gait is normal. CNII-XII grossly in tact. Psych:  Responds to questions appropriately with a normal affect.     ASSESSMENT AND PLAN:  [age] y.o. year old male with  1. Hypertension Blood pressure is still slightly elevated. However, he is 80 years old and still climbs ladders and does a lot of activity. Therefore i do not want BP to get too low and cause any lightheadedness. As well, in the past his blood pressure had been controlled. Will not make further adjustments at this time. We'll recheck lab work today on increased dose of lisinopril. I did  a BMET at the time of his last visit and it was completely normal. - BASIC METABOLIC PANEL WITH GFR - lisinopril (PRINIVIL,ZESTRIL) 20 MG tablet; Take 1 tablet (20 mg total) by mouth daily.  Dispense: 90 tablet; Refill: 1  2. Need for prophylactic vaccination against Streptococcus pneumoniae (pneumococcus) At his last visit we reviewed immunizations and plans to give him the Prevnar 13. However we worked out of this and was on order at that time. We'll update now. - Pneumococcal conjugate vaccine 13-valent  See office note 10/28/12 for complete note.  Signed, [address]Mary Beth MatoacaDixon, [place], [facility] 11/12/2013 11:12 AM

## 2013-11-14 ENCOUNTER — Other Ambulatory Visit: Payer: Self-pay | Admitting: Physician Assistant

## 2013-11-14 NOTE — Telephone Encounter (Signed)
Medication refilled per protocol. 

## 2013-12-15 ENCOUNTER — Telehealth: Payer: Self-pay | Admitting: Family Medicine

## 2013-12-15 MED ORDER — AMLODIPINE BESYLATE 5 MG PO TABS: 5.0000 mg | ORAL_TABLET | Freq: Every day | ORAL | Status: DC

## 2013-12-15 MED ORDER — TAMSULOSIN HCL 0.4 MG PO CAPS: 0.4000 mg | ORAL_CAPSULE | Freq: Every day | ORAL | Status: DC

## 2013-12-15 NOTE — Telephone Encounter (Signed)
90 day supplies approved

## 2014-05-13 ENCOUNTER — Ambulatory Visit (INDEPENDENT_AMBULATORY_CARE_PROVIDER_SITE_OTHER): Payer: Medicare Other | Admitting: Physician Assistant

## 2014-05-13 ENCOUNTER — Encounter: Payer: Self-pay | Admitting: Physician Assistant

## 2014-05-13 VITALS — BP 126/66 | HR 60 | Temp 96.7°F | Resp 18 | Wt 186.0 lb

## 2014-05-13 DIAGNOSIS — I10 Essential (primary) hypertension: Secondary | ICD-10-CM

## 2014-05-13 DIAGNOSIS — N400 Enlarged prostate without lower urinary tract symptoms: Secondary | ICD-10-CM

## 2014-05-13 DIAGNOSIS — M129 Arthropathy, unspecified: Secondary | ICD-10-CM

## 2014-05-13 DIAGNOSIS — J438 Other emphysema: Secondary | ICD-10-CM

## 2014-05-13 DIAGNOSIS — E785 Hyperlipidemia, unspecified: Secondary | ICD-10-CM

## 2014-05-13 DIAGNOSIS — R339 Retention of urine, unspecified: Secondary | ICD-10-CM

## 2014-05-13 DIAGNOSIS — R972 Elevated prostate specific antigen [PSA]: Secondary | ICD-10-CM

## 2014-05-13 DIAGNOSIS — J439 Emphysema, unspecified: Secondary | ICD-10-CM

## 2014-05-13 DIAGNOSIS — M1990 Unspecified osteoarthritis, unspecified site: Secondary | ICD-10-CM

## 2014-05-13 LAB — COMPLETE METABOLIC PANEL WITH GFR
ALT: 10 U/L (ref 0–53)
AST: 19 U/L (ref 0–37)
Albumin: 4.2 g/dL (ref 3.5–5.2)
Alkaline Phosphatase: 56 U/L (ref 39–117)
BUN: 12 mg/dL (ref 6–23)
CO2: 29 mEq/L (ref 19–32)
Calcium: 9.6 mg/dL (ref 8.4–10.5)
Chloride: 102 mEq/L (ref 96–112)
Creat: 0.86 mg/dL (ref 0.50–1.35)
GFR, Est African American: >89 mL/min
GFR, Est Non African American: 83 mL/min
Glucose, Bld: 98 mg/dL (ref 70–99)
Potassium: 4.1 mEq/L (ref 3.5–5.3)
Sodium: 139 mEq/L (ref 135–145)
Total Bilirubin: 0.6 mg/dL (ref 0.2–1.2)
Total Protein: 6.9 g/dL (ref 6.0–8.3)

## 2014-05-13 LAB — LIPID PANEL
Cholesterol: 176 mg/dL (ref 0–200)
HDL: 57 mg/dL (ref >39)
LDL Cholesterol: 96 mg/dL (ref 0–99)
Total CHOL/HDL Ratio: 3.1 Ratio
Triglycerides: 116 mg/dL (ref <150)
VLDL: 23 mg/dL (ref 0–40)

## 2014-05-13 NOTE — Progress Notes (Signed)
Patient ID: SARA SELVIDGE MRN: [id number], DOB: 04-Dec-1933, 80 y.o. Date of Encounter: @DATE @  Chief Complaint:  Chief Complaint  Patient presents with  . 6 month check up    is fasting    HPI: 80 y.o. year old white male  presents for routine followup office visit.  He says he's been feeling good. Says he hasn't been sick at all. He still climbs and moves around a lot. 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   Says that he has been following up with urology and "everything has turned out just fine." Had a biopsy which was normal. Is taking some medication by urology and this is controlling his symptoms completely.  He is taking both of his blood pressure medications as directed. No adverse effects.  At his office visit 10/28/13 his blood pressure was elevated. At that time we increased his lisinopril from 5 mg to 20 mg. He had F/U Office visit 11/12/13. Blood pressure was  Controlled. He had followup BMPT at that time also.  Even with exertion he is having no angina symptoms.  Says that he is thinking about getting married again. Says that both he and his friend are both widowed. Says that they have been "friends for a long time especially since they both lost her spouses."  Today he says that they've known each other since they were 80 years old. Says they went to church together back then. Asked if he would buy her ring and all of that and he says he's thinking about it but can't decide.   Past Medical History  Diagnosis Date  . H/O prostate biopsy   . Elevated PSA 10/16/2010  . Hyperlipidemia   . Hypertension   . COPD (chronic obstructive pulmonary disease)   . Arthritis   . Hemorrhoids, external   . BPH (benign prostatic hypertrophy) with urinary obstruction   . Back pain, chronic      Home Meds:    Outpatient Prescriptions Prior to Visit  Medication Sig Dispense Refill  . amLODipine (NORVASC) 5 MG tablet Take 1 tablet (5 mg total) by mouth daily.  90 tablet  1  . finasteride (PROSCAR) 5 MG tablet Take 1 tablet (5 mg total) by mouth daily.  30 tablet  5  . lisinopril (PRINIVIL,ZESTRIL) 20 MG tablet Take 1 tablet (20 mg total) by mouth daily.  90 tablet  1  . tamsulosin (FLOMAX) 0.4 MG CAPS capsule Take 1 capsule (0.4 mg total) by mouth daily after supper.  90 capsule  1   No facility-administered medications prior to visit.     Allergies:  Allergies  Allergen Reactions  . Penicillins Anaphylaxis    History   Social History  . Marital Status: Widowed    Spouse Name: N/A    Number of Children: N/A  . Years of Education: N/A   Occupational History  . Not on file.   Social History Main Topics  . Smoking status: Former Smoker -- 1.00 packs/day for 30 years    Types: Cigarettes    Quit date: 06/09/2005  . Smokeless tobacco: Never Used  . Alcohol Use: Yes     Comment: OCCASIONAL  . Drug Use: No  . Sexual Activity: Not on file   Other Topics Concern  .  Not on file   Social History Narrative  . No narrative on file    Family History  Problem Relation Age of Onset  . CVA Mother   . Cancer Father     brain tumor     Review of Systems:  See HPI for pertinent ROS. All other ROS negative.    Physical Exam: Blood pressure 126/66, pulse 60, temperature 96.7 F (35.9 C), temperature source Oral, resp. rate 18, weight 186 lb (84.369 kg)., Body mass index is 31.18 kg/(m^2). General:WNWD WM Appears in no acute distress. Neck: Supple. No thyromegaly. No lymphadenopathy. No carotid bruits. Lungs: Clear bilaterally to auscultation without wheezes, rales, or rhonchi. Breathing is unlabored. Heart: RRR with S1 S2. No murmurs, rubs, or gallops. Abdomen: Soft, non-tender, non-distended with normoactive bowel sounds. No hepatomegaly. No rebound/guarding. No obvious abdominal  masses. Musculoskeletal:  Strength and tone normal for age. Extremities/Skin: Warm and dry. No edema.  Neuro: Alert and oriented X 3. Moves all extremities spontaneously. Gait is normal. CNII-XII grossly in tact. Psych:  Responds to questions appropriately with a normal affect.     ASSESSMENT AND PLAN:  80 y.o. year old male with   1. Essential hypertension Blood pressure is controlled/at goal. Continue current medication. Check labs monitor. - COMPLETE METABOLIC PANEL WITH GFR  2. Hyperlipidemia  He has not had his lipids checked recently and he is fasting so we will check this today. - COMPLETE METABOLIC PANEL WITH GFR - Lipid panel  3. Pulmonary emphysema, unspecified emphysema type  5. COPD (chronic obstructive pulmonary disease) Discussed with patient again today. He still reports he is having no difficulty with his breathing even since he has stopped the medication. He had PFTs in the past consistent with stage II emphysema. He was on medication in the past. He stopped this and continues to report his breathing is stable off meds. He quit smoking in 2003. 40 pack year history.  4. Arthritis  5. BPH with elevated PSA He sees urology routinely for this.  6. Retention of urine, unspecified He sees urology routinely for this.  7. colonoscopy: Per patient he had this in 2003. He refuses followup. He is aware of risk versus benefit.  8. Immunizations: Influenza: He does usually get this on an annual seasonal basis. He was given Pneumovax  23   -- 11/04/2012.  Prevnar 13 --given here 11/12/2013.     Regular office visit 6 months or sooner if he needs us.   [address], Mary Beth Valley ParkDixon, [place], [facility] 05/13/2014 10:37 AM

## 2014-05-14 ENCOUNTER — Encounter: Payer: Self-pay | Admitting: *Deleted

## 2014-05-16 ENCOUNTER — Other Ambulatory Visit: Payer: Self-pay | Admitting: Physician Assistant

## 2014-05-18 NOTE — Telephone Encounter (Signed)
Refill appropriate and filled per protocol. 

## 2014-06-19 ENCOUNTER — Other Ambulatory Visit: Payer: Self-pay | Admitting: Physician Assistant

## 2014-06-19 NOTE — Telephone Encounter (Signed)
Refill appropriate and filled per protocol. 

## 2014-09-17 ENCOUNTER — Other Ambulatory Visit: Payer: Self-pay | Admitting: Physician Assistant

## 2014-09-17 NOTE — Telephone Encounter (Signed)
Medication refilled per protocol. 

## 2014-11-12 ENCOUNTER — Encounter: Payer: Self-pay | Admitting: Physician Assistant

## 2014-11-12 ENCOUNTER — Ambulatory Visit (INDEPENDENT_AMBULATORY_CARE_PROVIDER_SITE_OTHER): Payer: Medicare Other | Admitting: Physician Assistant

## 2014-11-12 VITALS — BP 160/80 | HR 60 | Temp 97.8°F | Resp 18 | Wt 178.0 lb

## 2014-11-12 DIAGNOSIS — M1990 Unspecified osteoarthritis, unspecified site: Secondary | ICD-10-CM

## 2014-11-12 DIAGNOSIS — J439 Emphysema, unspecified: Secondary | ICD-10-CM | POA: Diagnosis not present

## 2014-11-12 DIAGNOSIS — Z23 Encounter for immunization: Secondary | ICD-10-CM

## 2014-11-12 DIAGNOSIS — R972 Elevated prostate specific antigen [PSA]: Secondary | ICD-10-CM | POA: Diagnosis not present

## 2014-11-12 DIAGNOSIS — I10 Essential (primary) hypertension: Secondary | ICD-10-CM

## 2014-11-12 DIAGNOSIS — E785 Hyperlipidemia, unspecified: Secondary | ICD-10-CM

## 2014-11-12 DIAGNOSIS — R339 Retention of urine, unspecified: Secondary | ICD-10-CM

## 2014-11-12 DIAGNOSIS — N400 Enlarged prostate without lower urinary tract symptoms: Secondary | ICD-10-CM

## 2014-11-12 LAB — COMPLETE METABOLIC PANEL WITH GFR
ALT: 9 U/L (ref 0–53)
AST: 20 U/L (ref 0–37)
Albumin: 3.9 g/dL (ref 3.5–5.2)
Alkaline Phosphatase: 70 U/L (ref 39–117)
BUN: 16 mg/dL (ref 6–23)
CO2: 25 mEq/L (ref 19–32)
Calcium: 9.1 mg/dL (ref 8.4–10.5)
Chloride: 103 mEq/L (ref 96–112)
Creat: 0.82 mg/dL (ref 0.50–1.35)
GFR, Est African American: >89 mL/min
GFR, Est Non African American: 85 mL/min
Glucose, Bld: 94 mg/dL (ref 70–99)
Potassium: 4.4 mEq/L (ref 3.5–5.3)
Sodium: 139 mEq/L (ref 135–145)
Total Bilirubin: 0.4 mg/dL (ref 0.2–1.2)
Total Protein: 6.7 g/dL (ref 6.0–8.3)

## 2014-11-12 MED ORDER — AMLODIPINE BESYLATE 5 MG PO TABS: 5.0000 mg | ORAL_TABLET | Freq: Every day | ORAL | Status: DC
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
## 2014-12-09 ENCOUNTER — Other Ambulatory Visit: Payer: Self-pay | Admitting: Physician Assistant

## 2014-12-09 NOTE — Telephone Encounter (Signed)
Medication refilled per protocol. 

## 2015-01-09 ENCOUNTER — Other Ambulatory Visit: Payer: Self-pay | Admitting: Physician Assistant

## 2015-01-11 NOTE — Telephone Encounter (Signed)
Medication refilled per protocol. 

## 2015-01-28 ENCOUNTER — Other Ambulatory Visit: Payer: Self-pay | Admitting: Physician Assistant

## 2015-01-28 NOTE — Telephone Encounter (Signed)
Medication refilled per protocol. 

## 2015-01-29 ENCOUNTER — Telehealth: Payer: Self-pay | Admitting: Family Medicine

## 2015-01-29 MED ORDER — FINASTERIDE 5 MG PO TABS: 5.0000 mg | ORAL_TABLET | Freq: Every day | ORAL | Status: DC

## 2015-01-29 NOTE — Telephone Encounter (Signed)
Medication refilled per protocol. 

## 2015-05-09 ENCOUNTER — Other Ambulatory Visit: Payer: Self-pay | Admitting: Physician Assistant

## 2015-05-10 NOTE — Telephone Encounter (Signed)
Medication refilled per protocol. 

## 2015-05-13 ENCOUNTER — Encounter: Payer: Medicare Other | Admitting: Physician Assistant

## 2015-06-18 ENCOUNTER — Other Ambulatory Visit: Payer: Self-pay | Admitting: Physician Assistant

## 2015-06-18 ENCOUNTER — Encounter: Payer: Self-pay | Admitting: Family Medicine

## 2015-06-18 NOTE — Telephone Encounter (Signed)
Medication refilled per protocol. 

## 2015-07-19 ENCOUNTER — Other Ambulatory Visit: Payer: Self-pay | Admitting: Physician Assistant

## 2015-07-19 NOTE — Telephone Encounter (Signed)
Medication refill for one time only.  Patient needs to be seen.  Letter sent for patient to call and schedule 

## 2015-08-14 ENCOUNTER — Other Ambulatory Visit: Payer: Self-pay | Admitting: Physician Assistant

## 2015-08-16 NOTE — Telephone Encounter (Signed)
Medication refilled per protocol. 

## 2015-09-15 ENCOUNTER — Other Ambulatory Visit: Payer: Self-pay | Admitting: Physician Assistant

## 2015-09-15 ENCOUNTER — Encounter: Payer: Self-pay | Admitting: Family Medicine

## 2015-09-15 NOTE — Telephone Encounter (Signed)
Medication refill for one time only.  Patient needs to be seen.  Letter sent for patient to call and schedule 

## 2015-09-29 ENCOUNTER — Other Ambulatory Visit: Payer: Self-pay | Admitting: Physician Assistant

## 2015-09-29 ENCOUNTER — Encounter: Payer: Self-pay | Admitting: Family Medicine

## 2015-09-29 DIAGNOSIS — I10 Essential (primary) hypertension: Secondary | ICD-10-CM

## 2015-09-29 NOTE — Telephone Encounter (Signed)
Medication refill for one time only.  Patient needs to be seen.  Letter sent for patient to call and schedule 

## 2015-11-14 ENCOUNTER — Other Ambulatory Visit: Payer: Self-pay | Admitting: Physician Assistant

## 2015-11-15 ENCOUNTER — Encounter: Payer: Self-pay | Admitting: Family Medicine

## 2015-11-15 NOTE — Telephone Encounter (Addendum)
Medication refill for one time only.  Patient needs to be seen.  Letter sent for patient to call and schedule.  Pt has not been seen in over 1 year. 

## 2015-12-12 ENCOUNTER — Other Ambulatory Visit: Payer: Self-pay | Admitting: Physician Assistant

## 2015-12-12 DIAGNOSIS — N400 Enlarged prostate without lower urinary tract symptoms: Secondary | ICD-10-CM | POA: Diagnosis present

## 2015-12-12 DIAGNOSIS — I444 Left anterior fascicular block: Secondary | ICD-10-CM | POA: Diagnosis present

## 2015-12-12 DIAGNOSIS — J441 Chronic obstructive pulmonary disease with (acute) exacerbation: Principal | ICD-10-CM | POA: Diagnosis present

## 2015-12-12 DIAGNOSIS — R748 Abnormal levels of other serum enzymes: Secondary | ICD-10-CM | POA: Diagnosis present

## 2015-12-12 DIAGNOSIS — J209 Acute bronchitis, unspecified: Secondary | ICD-10-CM | POA: Diagnosis present

## 2015-12-12 DIAGNOSIS — I10 Essential (primary) hypertension: Secondary | ICD-10-CM | POA: Diagnosis present

## 2015-12-12 DIAGNOSIS — J440 Chronic obstructive pulmonary disease with acute lower respiratory infection: Secondary | ICD-10-CM | POA: Diagnosis present

## 2015-12-12 DIAGNOSIS — E785 Hyperlipidemia, unspecified: Secondary | ICD-10-CM | POA: Diagnosis present

## 2015-12-12 DIAGNOSIS — Z880 Allergy status to penicillin: Secondary | ICD-10-CM

## 2015-12-12 DIAGNOSIS — R7989 Other specified abnormal findings of blood chemistry: Secondary | ICD-10-CM | POA: Diagnosis not present

## 2015-12-12 DIAGNOSIS — Z87891 Personal history of nicotine dependence: Secondary | ICD-10-CM

## 2015-12-12 DIAGNOSIS — I493 Ventricular premature depolarization: Secondary | ICD-10-CM | POA: Diagnosis present

## 2015-12-12 DIAGNOSIS — E876 Hypokalemia: Secondary | ICD-10-CM | POA: Diagnosis not present

## 2015-12-13 ENCOUNTER — Inpatient Hospital Stay (HOSPITAL_COMMUNITY)
Admission: EM | Admit: 2015-12-13 | Discharge: 2015-12-13 | DRG: 192 | Disposition: A | Discharge status: Home / Self Care | Payer: Medicare Other | Attending: Internal Medicine | Admitting: Internal Medicine

## 2015-12-13 ENCOUNTER — Emergency Department (HOSPITAL_COMMUNITY): Payer: Medicare Other

## 2015-12-13 ENCOUNTER — Inpatient Hospital Stay (HOSPITAL_COMMUNITY): Payer: Medicare Other

## 2015-12-13 ENCOUNTER — Encounter (HOSPITAL_COMMUNITY): Payer: Self-pay | Admitting: *Deleted

## 2015-12-13 DIAGNOSIS — N400 Enlarged prostate without lower urinary tract symptoms: Secondary | ICD-10-CM | POA: Diagnosis present

## 2015-12-13 DIAGNOSIS — Z880 Allergy status to penicillin: Secondary | ICD-10-CM | POA: Diagnosis not present

## 2015-12-13 DIAGNOSIS — E876 Hypokalemia: Secondary | ICD-10-CM | POA: Diagnosis not present

## 2015-12-13 DIAGNOSIS — R778 Other specified abnormalities of plasma proteins: Secondary | ICD-10-CM | POA: Diagnosis present

## 2015-12-13 DIAGNOSIS — I10 Essential (primary) hypertension: Secondary | ICD-10-CM | POA: Diagnosis present

## 2015-12-13 DIAGNOSIS — R748 Abnormal levels of other serum enzymes: Secondary | ICD-10-CM | POA: Diagnosis present

## 2015-12-13 DIAGNOSIS — I444 Left anterior fascicular block: Secondary | ICD-10-CM | POA: Diagnosis present

## 2015-12-13 DIAGNOSIS — J441 Chronic obstructive pulmonary disease with (acute) exacerbation: Principal | ICD-10-CM

## 2015-12-13 DIAGNOSIS — Z87891 Personal history of nicotine dependence: Secondary | ICD-10-CM | POA: Diagnosis not present

## 2015-12-13 DIAGNOSIS — I493 Ventricular premature depolarization: Secondary | ICD-10-CM | POA: Diagnosis present

## 2015-12-13 DIAGNOSIS — J209 Acute bronchitis, unspecified: Secondary | ICD-10-CM | POA: Diagnosis present

## 2015-12-13 DIAGNOSIS — R0600 Dyspnea, unspecified: Secondary | ICD-10-CM

## 2015-12-13 DIAGNOSIS — E785 Hyperlipidemia, unspecified: Secondary | ICD-10-CM | POA: Diagnosis present

## 2015-12-13 DIAGNOSIS — R7989 Other specified abnormal findings of blood chemistry: Secondary | ICD-10-CM | POA: Diagnosis present

## 2015-12-13 DIAGNOSIS — J440 Chronic obstructive pulmonary disease with acute lower respiratory infection: Secondary | ICD-10-CM | POA: Diagnosis present

## 2015-12-13 DIAGNOSIS — R972 Elevated prostate specific antigen [PSA]: Secondary | ICD-10-CM

## 2015-12-13 LAB — CBC WITH DIFFERENTIAL/PLATELET
Band Neutrophils: 10 %
Basophils Absolute: 0 10*3/uL (ref 0.0–0.1)
Basophils Absolute: 0 10*3/uL (ref 0.0–0.1)
Basophils Relative: 0 %
Basophils Relative: 0 %
Eosinophils Absolute: 0 10*3/uL (ref 0.0–0.7)
Eosinophils Absolute: 0 10*3/uL (ref 0.0–0.7)
Eosinophils Relative: 0 %
Eosinophils Relative: 0 %
HCT: 41.5 % (ref 39.0–52.0)
HCT: 41.5 % (ref 39.0–52.0)
Hemoglobin: 14 g/dL (ref 13.0–17.0)
Hemoglobin: 13.5 g/dL (ref 13.0–17.0)
Lymphocytes Relative: 8 %
Lymphocytes Relative: 2 %
Lymphs Abs: 1.1 10*3/uL (ref 0.7–4.0)
Lymphs Abs: 0.3 10*3/uL — ABNORMAL LOW (ref 0.7–4.0)
MCH: 29.6 pg (ref 26.0–34.0)
MCH: 28.5 pg (ref 26.0–34.0)
MCHC: 33.7 g/dL (ref 30.0–36.0)
MCHC: 32.5 g/dL (ref 30.0–36.0)
MCV: 87.7 fL (ref 78.0–100.0)
MCV: 87.7 fL (ref 78.0–100.0)
Monocytes Absolute: 1 10*3/uL (ref 0.1–1.0)
Monocytes Absolute: 0.2 10*3/uL (ref 0.1–1.0)
Monocytes Relative: 7 %
Monocytes Relative: 2 %
Neutro Abs: 11.6 10*3/uL — ABNORMAL HIGH (ref 1.7–7.7)
Neutro Abs: 10.4 10*3/uL — ABNORMAL HIGH (ref 1.7–7.7)
Neutrophils Relative %: 75 %
Neutrophils Relative %: 96 %
Platelets: 190 10*3/uL (ref 150–400)
Platelets: 191 10*3/uL (ref 150–400)
RBC: 4.73 MIL/uL (ref 4.22–5.81)
RBC: 4.73 MIL/uL (ref 4.22–5.81)
RDW: 13.9 % (ref 11.5–15.5)
RDW: 13.9 % (ref 11.5–15.5)
WBC: 13.7 10*3/uL — ABNORMAL HIGH (ref 4.0–10.5)
WBC: 10.8 10*3/uL — ABNORMAL HIGH (ref 4.0–10.5)

## 2015-12-13 LAB — COMPREHENSIVE METABOLIC PANEL
ALT: 13 U/L — ABNORMAL LOW (ref 17–63)
AST: 31 U/L (ref 15–41)
Albumin: 2.9 g/dL — ABNORMAL LOW (ref 3.5–5.0)
Alkaline Phosphatase: 56 U/L (ref 38–126)
Anion gap: 11 (ref 5–15)
BUN: 14 mg/dL (ref 6–20)
CO2: 26 mmol/L (ref 22–32)
Calcium: 9.2 mg/dL (ref 8.9–10.3)
Chloride: 99 mmol/L — ABNORMAL LOW (ref 101–111)
Creatinine, Ser: 1.14 mg/dL (ref 0.61–1.24)
GFR calc Af Amer: >60 mL/min (ref >60)
GFR calc non Af Amer: 59 mL/min — ABNORMAL LOW (ref >60)
Glucose, Bld: 205 mg/dL — ABNORMAL HIGH (ref 65–99)
Potassium: 3.1 mmol/L — ABNORMAL LOW (ref 3.5–5.1)
Sodium: 136 mmol/L (ref 135–145)
Total Bilirubin: 1.1 mg/dL (ref 0.3–1.2)
Total Protein: 6.4 g/dL — ABNORMAL LOW (ref 6.5–8.1)

## 2015-12-13 LAB — BASIC METABOLIC PANEL
Anion gap: 11 (ref 5–15)
BUN: 12 mg/dL (ref 6–20)
CO2: 26 mmol/L (ref 22–32)
Calcium: 9.4 mg/dL (ref 8.9–10.3)
Chloride: 101 mmol/L (ref 101–111)
Creatinine, Ser: 0.95 mg/dL (ref 0.61–1.24)
GFR calc Af Amer: >60 mL/min (ref >60)
GFR calc non Af Amer: >60 mL/min (ref >60)
Glucose, Bld: 146 mg/dL — ABNORMAL HIGH (ref 65–99)
Potassium: 3.8 mmol/L (ref 3.5–5.1)
Sodium: 138 mmol/L (ref 135–145)

## 2015-12-13 LAB — I-STAT ARTERIAL BLOOD GAS, ED
Acid-Base Excess: 2 mmol/L (ref 0.0–2.0)
Bicarbonate: 27.8 mEq/L — ABNORMAL HIGH (ref 20.0–24.0)
O2 Saturation: 93 %
Patient temperature: 98.4
TCO2: 29 mmol/L (ref 0–100)
pCO2 arterial: 46.4 mmHg — ABNORMAL HIGH (ref 35.0–45.0)
pH, Arterial: 7.384 (ref 7.350–7.450)
pO2, Arterial: 69 mmHg — ABNORMAL LOW (ref 80.0–100.0)

## 2015-12-13 LAB — TSH: TSH: 0.404 u[IU]/mL (ref 0.350–4.500)

## 2015-12-13 LAB — INFLUENZA PANEL BY PCR (TYPE A & B)
H1N1 flu by pcr: NOT DETECTED
Influenza A By PCR: NEGATIVE
Influenza B By PCR: NEGATIVE

## 2015-12-13 LAB — TROPONIN I
Troponin I: 0.06 ng/mL — ABNORMAL HIGH (ref <0.031)
Troponin I: <0.03 ng/mL (ref <0.031)
Troponin I: <0.03 ng/mL (ref <0.031)

## 2015-12-13 LAB — MAGNESIUM: Magnesium: 2.3 mg/dL (ref 1.7–2.4)

## 2015-12-13 LAB — BRAIN NATRIURETIC PEPTIDE: B Natriuretic Peptide: 119.9 pg/mL — ABNORMAL HIGH (ref 0.0–100.0)

## 2015-12-13 IMAGING — CR DG CHEST 2V
2 series · 2 of 2 positions shown · non-contrast
Comparison: Chest CT dated [DATE] and radiograph dated
[DATE]

CLINICAL DATA: 82-year-old male with cough and shortness of breath

EXAM:
CHEST  2 VIEW

[chest lat]
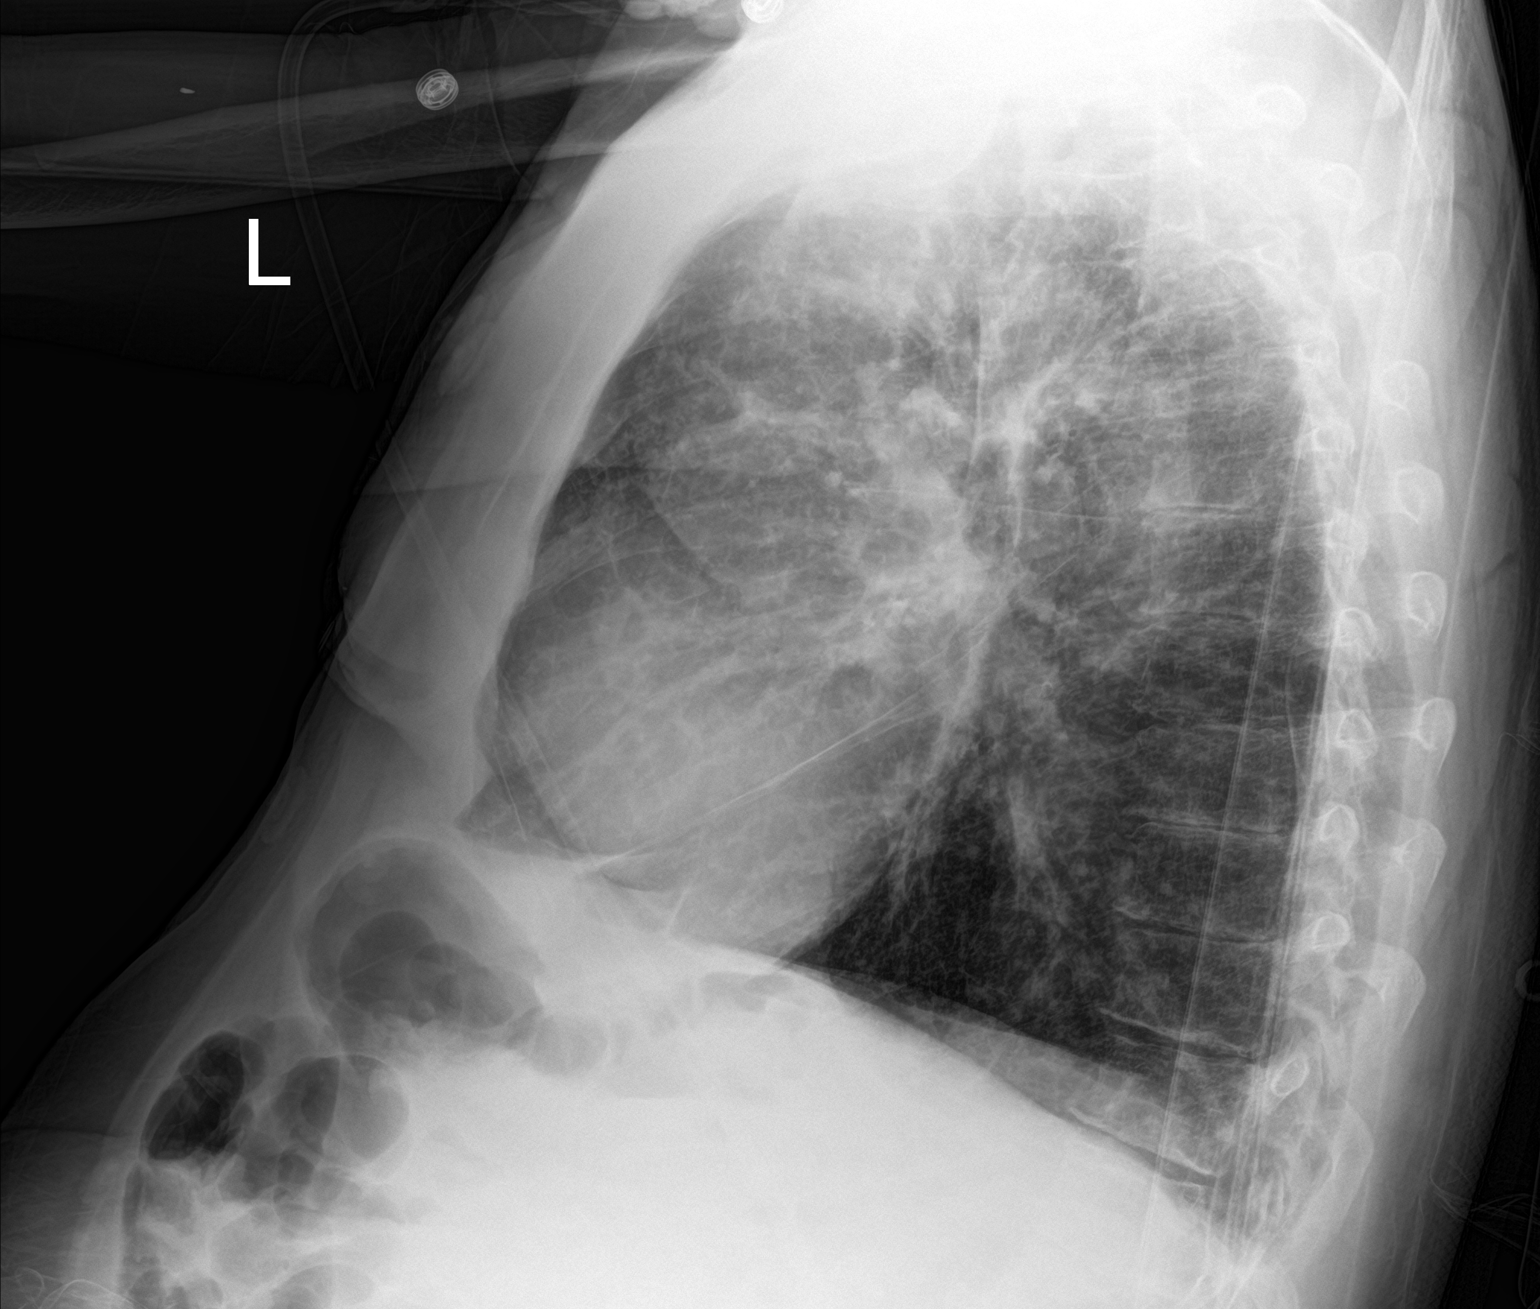

[chest ap]
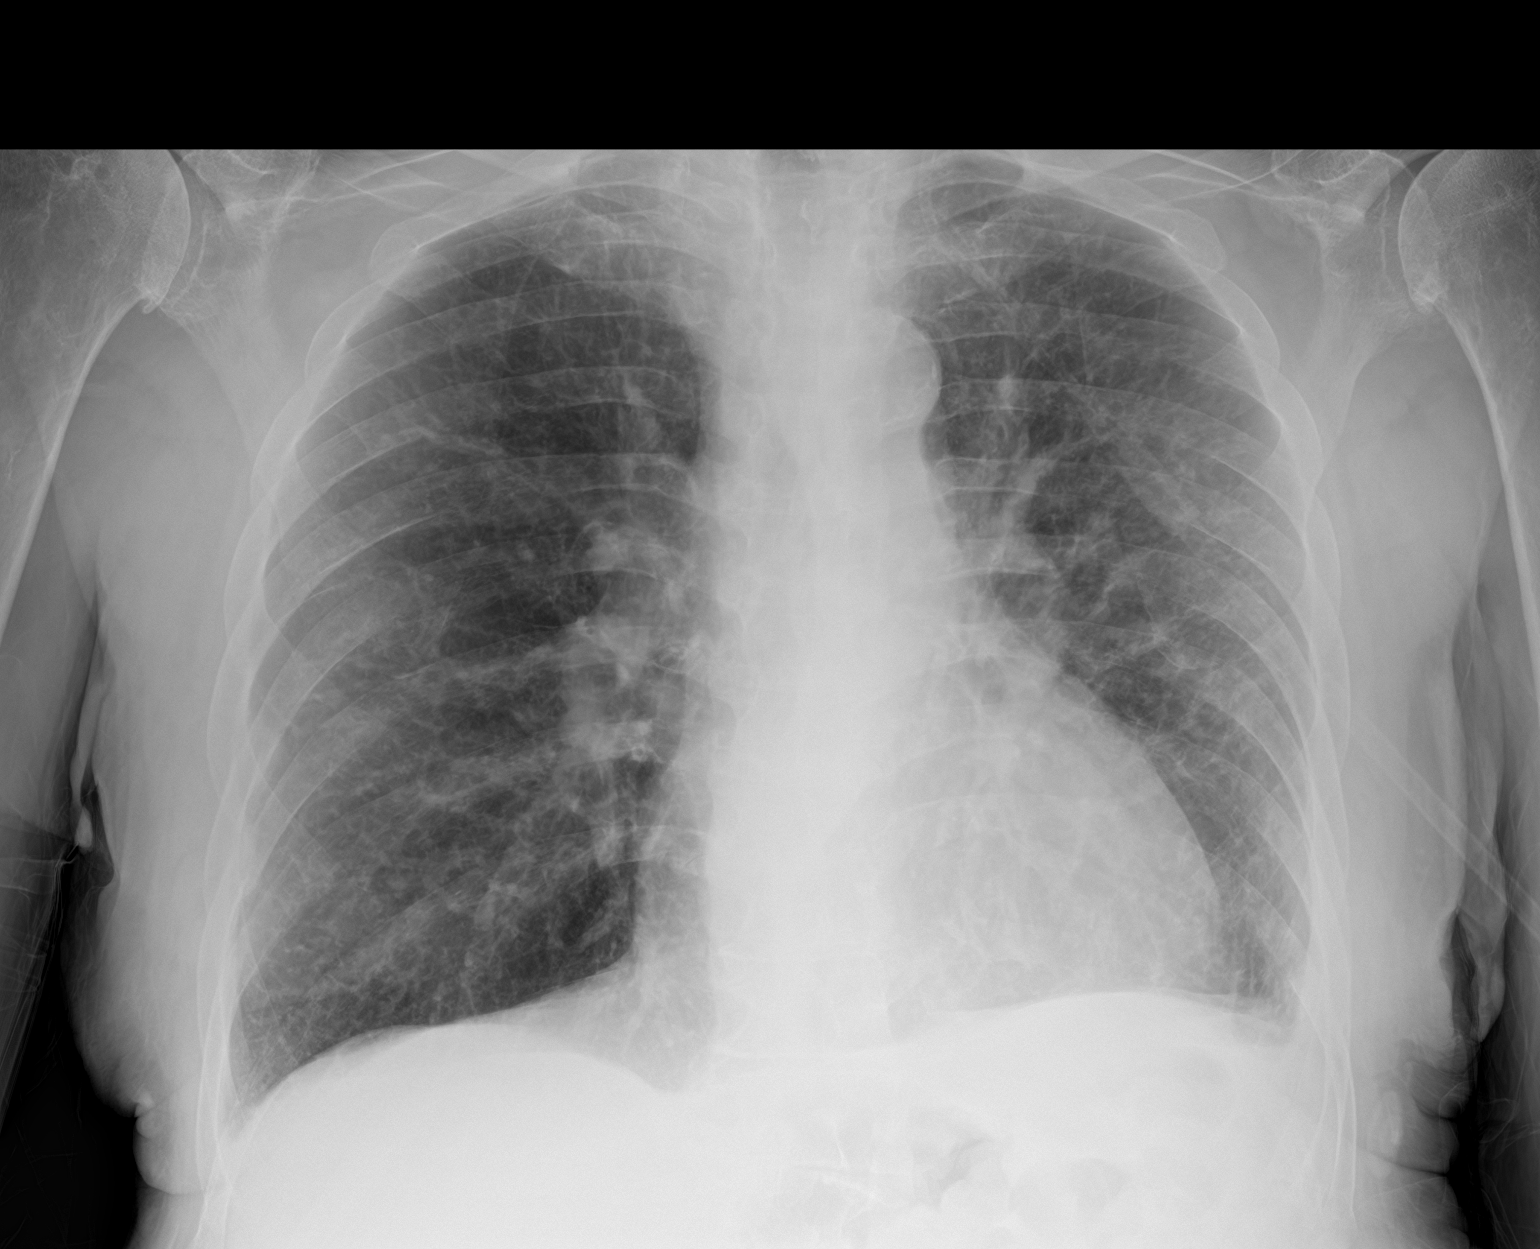

[2 of 2 positions shown; findings below may reference images not displayed]

FINDINGS: Two views of the chest do not demonstrate a focal consolidation.
There is no pleural effusion or pneumothorax. There is blunting of
left costophrenic angle as seen on the prior CT and related to
underlying scarring. Top-normal cardiac size. Degenerative changes
of the spine. No acute osseous pathology.
IMPRESSION: No active cardiopulmonary disease.

## 2015-12-13 MED ORDER — ONDANSETRON HCL 4 MG/2ML IJ SOLN
4.0000 mg | Freq: Four times a day (QID) | INTRAMUSCULAR | Status: DC | PRN
Start: 2015-12-13 — End: 2015-12-13

## 2015-12-13 MED ORDER — ASPIRIN EC 325 MG PO TBEC
325.0000 mg | DELAYED_RELEASE_TABLET | Freq: Every day | ORAL | Status: DC
  Filled 2015-12-13: qty 1

## 2015-12-13 MED ORDER — ALBUTEROL SULFATE HFA 108 (90 BASE) MCG/ACT IN AERS: 2.0000 | INHALATION_SPRAY | Freq: Four times a day (QID) | RESPIRATORY_TRACT | Status: DC | PRN

## 2015-12-13 MED ORDER — FINASTERIDE 5 MG PO TABS
5.0000 mg | ORAL_TABLET | Freq: Every day | ORAL | Status: DC
  Filled 2015-12-13: qty 1

## 2015-12-13 MED ORDER — ACETAMINOPHEN 325 MG PO TABS: 650.0000 mg | ORAL_TABLET | Freq: Four times a day (QID) | ORAL | Status: DC | PRN

## 2015-12-13 MED ORDER — IPRATROPIUM-ALBUTEROL 0.5-2.5 (3) MG/3ML IN SOLN
3.0000 mL | Freq: Four times a day (QID) | RESPIRATORY_TRACT | Status: DC
Start: 2015-12-13 — End: 2015-12-13

## 2015-12-13 MED ORDER — IPRATROPIUM-ALBUTEROL 0.5-2.5 (3) MG/3ML IN SOLN
3.0000 mL | Freq: Once | RESPIRATORY_TRACT | Status: AC
  Administered 2015-12-13: 3 mL via RESPIRATORY_TRACT
  Filled 2015-12-13: qty 3

## 2015-12-13 MED ORDER — AMLODIPINE BESYLATE 5 MG PO TABS
5.0000 mg | ORAL_TABLET | Freq: Every day | ORAL | Status: DC
  Filled 2015-12-13: qty 1

## 2015-12-13 MED ORDER — IPRATROPIUM-ALBUTEROL 0.5-2.5 (3) MG/3ML IN SOLN: 3.0000 mL | Freq: Two times a day (BID) | RESPIRATORY_TRACT | Status: DC

## 2015-12-13 MED ORDER — IPRATROPIUM BROMIDE 0.02 % IN SOLN
0.5000 mg | RESPIRATORY_TRACT | Status: DC
  Administered 2015-12-13: 0.5 mg via RESPIRATORY_TRACT
  Filled 2015-12-13: qty 2.5

## 2015-12-13 MED ORDER — CETYLPYRIDINIUM CHLORIDE 0.05 % MT LIQD
7.0000 mL | Freq: Two times a day (BID) | OROMUCOSAL | Status: DC
Start: 2015-12-13 — End: 2015-12-13

## 2015-12-13 MED ORDER — ALBUTEROL SULFATE (2.5 MG/3ML) 0.083% IN NEBU: 2.5000 mg | INHALATION_SOLUTION | RESPIRATORY_TRACT | Status: DC | PRN

## 2015-12-13 MED ORDER — ACETAMINOPHEN 650 MG RE SUPP: 650.0000 mg | Freq: Four times a day (QID) | RECTAL | Status: DC | PRN

## 2015-12-13 MED ORDER — TAMSULOSIN HCL 0.4 MG PO CAPS: 0.4000 mg | ORAL_CAPSULE | Freq: Every day | ORAL | Status: DC

## 2015-12-13 MED ORDER — POTASSIUM CHLORIDE CRYS ER 20 MEQ PO TBCR
40.0000 meq | EXTENDED_RELEASE_TABLET | Freq: Once | ORAL | Status: AC
  Administered 2015-12-13: 40 meq via ORAL
  Filled 2015-12-13: qty 2

## 2015-12-13 MED ORDER — LEVOFLOXACIN IN D5W 500 MG/100ML IV SOLN
500.0000 mg | Freq: Once | INTRAVENOUS | Status: DC
  Filled 2015-12-13: qty 100

## 2015-12-13 MED ORDER — ENOXAPARIN SODIUM 40 MG/0.4ML ~~LOC~~ SOLN: 40.0000 mg | SUBCUTANEOUS | Status: DC

## 2015-12-13 MED ORDER — ASPIRIN 81 MG PO CHEW
324.0000 mg | CHEWABLE_TABLET | Freq: Once | ORAL | Status: AC
  Administered 2015-12-13: 324 mg via ORAL
  Filled 2015-12-13: qty 4

## 2015-12-13 MED ORDER — LISINOPRIL 20 MG PO TABS
20.0000 mg | ORAL_TABLET | Freq: Every day | ORAL | Status: DC
  Filled 2015-12-13: qty 1

## 2015-12-13 MED ORDER — PREDNISONE 20 MG PO TABS: 40.0000 mg | ORAL_TABLET | Freq: Every day | ORAL | Status: AC

## 2015-12-13 MED ORDER — BUDESONIDE 0.25 MG/2ML IN SUSP
0.2500 mg | Freq: Two times a day (BID) | RESPIRATORY_TRACT | Status: DC
  Administered 2015-12-13: 0.25 mg via RESPIRATORY_TRACT
  Filled 2015-12-13: qty 2

## 2015-12-13 MED ORDER — GUAIFENESIN-DM 100-10 MG/5ML PO SYRP: 5.0000 mL | ORAL_SOLUTION | ORAL | Status: DC | PRN

## 2015-12-13 MED ORDER — ONDANSETRON HCL 4 MG PO TABS: 4.0000 mg | ORAL_TABLET | Freq: Four times a day (QID) | ORAL | Status: DC | PRN

## 2015-12-13 MED ORDER — LEVOFLOXACIN IN D5W 750 MG/150ML IV SOLN
750.0000 mg | INTRAVENOUS | Status: AC
  Administered 2015-12-13: 750 mg via INTRAVENOUS
  Filled 2015-12-13: qty 150

## 2015-12-13 MED ORDER — IPRATROPIUM-ALBUTEROL 0.5-2.5 (3) MG/3ML IN SOLN: 3.0000 mL | RESPIRATORY_TRACT | Status: DC

## 2015-12-13 MED ORDER — LEVOFLOXACIN 750 MG PO TABS: 750.0000 mg | ORAL_TABLET | Freq: Every day | ORAL | Status: AC

## 2015-12-13 MED ORDER — ALBUTEROL SULFATE (2.5 MG/3ML) 0.083% IN NEBU
2.5000 mg | INHALATION_SOLUTION | RESPIRATORY_TRACT | Status: DC
  Administered 2015-12-13: 2.5 mg via RESPIRATORY_TRACT
  Filled 2015-12-13: qty 3

## 2015-12-13 MED ORDER — METHYLPREDNISOLONE SODIUM SUCC 125 MG IJ SOLR
125.0000 mg | Freq: Once | INTRAMUSCULAR | Status: AC
  Administered 2015-12-13: 125 mg via INTRAVENOUS
  Filled 2015-12-13: qty 2

## 2015-12-13 MED ORDER — METHYLPREDNISOLONE SODIUM SUCC 40 MG IJ SOLR
40.0000 mg | Freq: Every day | INTRAMUSCULAR | Status: DC
  Administered 2015-12-13: 40 mg via INTRAVENOUS
  Filled 2015-12-13: qty 1

## 2015-12-13 NOTE — Progress Notes (Signed)
New Admission Note:   Arrival: From ED on stretcher Mental Orientation: A&Ox4 Telemetry: None ordered Assessment:  See doc flowsheet Skin: Intact. IV: Right forearm IV Pain: None Safety Measures:  Call bell placed within reach; patient instructed on use of call bell and verbalized understanding. Bed in lowest position.   [address] Orientation: Patient oriented to staff, room, and unit. Family: None at bedside  Orders have been reviewed and implemented. Patient declined to remove pants.  Admission questions completed.  Will continue to monitor.  Rozann Lesches, RN, BSN

## 2015-12-13 NOTE — Discharge Instructions (Signed)
Chronic Obstructive Pulmonary Disease °Chronic obstructive pulmonary disease (COPD) is a common lung problem. In COPD, the flow of air from the lungs is limited. The way your lungs work will probably never return to normal, but there are things you can do to improve your lungs and make yourself feel better. Your doctor may treat your condition with: °· Medicines. °· Oxygen. °· Lung surgery. °· Changes to your diet. °· Rehabilitation. This may involve a team of specialists. °HOME CARE °· Take all medicines as told by your doctor. °· Avoid medicines or cough syrups that dry up your airway (such as antihistamines) and do not allow you to get rid of thick spit. You do not need to avoid them if told differently by your doctor. °· If you smoke, stop. Smoking makes the problem worse. °· Avoid being around things that make your breathing worse (like smoke, chemicals, and fumes). °· Use oxygen therapy and therapy to help improve your lungs (pulmonary rehabilitation) if told by your doctor. If you need home oxygen therapy, ask your doctor if you should buy a tool to measure your oxygen level (oximeter). °· Avoid people who have a sickness you can catch (contagious). °· Avoid going outside when it is very hot, cold, or humid. °· Eat healthy foods. Eat smaller meals more often. Rest before meals. °· Stay active, but remember to also rest. °· Make sure to get all the shots (vaccines) your doctor recommends. Ask your doctor if you need a pneumonia shot. °· Learn and use tips on how to relax. °· Learn and use tips on how to control your breathing as told by your doctor. Try: °¨ Breathing in (inhaling) through your nose for 1 second. Then, pucker your lips and breath out (exhale) through your lips for 2 seconds. °¨ Putting one hand on your belly (abdomen). Breathe in slowly through your nose for 1 second. Your hand on your belly should move out. Pucker your lips and breathe out slowly through your lips. Your hand on your belly  should move in as you breathe out. °· Learn and use controlled coughing to clear thick spit from your lungs. The steps are: °1. Lean your head a little forward. °2. Breathe in deeply. °3. Try to hold your breath for 3 seconds. °4. Keep your mouth slightly open while coughing 2 times. °5. Spit any thick spit out into a tissue. °6. Rest and do the steps again 1 or 2 times as needed. °GET HELP IF: °· You cough up more thick spit than usual. °· There is a change in the color or thickness of the spit. °· It is harder to breathe than usual. °· Your breathing is faster than usual. °GET HELP RIGHT AWAY IF: °· You have shortness of breath while resting. °· You have shortness of breath that stops you from: °¨ Being able to talk. °¨ Doing normal activities. °· You chest hurts for longer than 5 minutes. °· Your skin color is more blue than usual. °· Your pulse oximeter shows that you have low oxygen for longer than 5 minutes. °MAKE SURE YOU: °· Understand these instructions. °· Will watch your condition. °· Will get help right away if you are not doing well or get worse. °  °This information is not intended to replace advice given to you by your health care provider. Make sure you discuss any questions you have with your health care provider. °  °Document Released: 03/20/2008 Document Revised: 10/23/2014 Document Reviewed: 05/29/2013 °Elsevier Interactive Patient   Education ©2016 [employer] ° °

## 2015-12-13 NOTE — Progress Notes (Signed)
Report received. Room ready.  

## 2015-12-13 NOTE — Discharge Summary (Signed)
Physician Discharge Summary  Kenneth Woods WJX:[id number] DOB: [birth year] DOA: 12/13/2015  PCP: Frazier Richards, PA-C  Admit date: 12/13/2015 Discharge date: 12/13/2015  Time spent: 25 minutes  Recommendations for Outpatient Follow-up:  1.  home with outpatient PCP follow. Discharged on 5 day course of oral Levaquin and oral prednisone   Discharge Diagnoses:  Active Problems:   Acute bronchitis versus COPD exacerbation (HCC)   Hyperlipidemia   Hypertension   BPH with elevated PSA   Elevated troponin   Hypokalemia   Discharge Condition: Fair  Diet recommendation: Low sodium  CODE STATUS: Full code  Family communication: None at bedside  [facility] Weights   12/13/15 0447  Weight: 68.04 kg (150 lb)    History of present illness:  82 year old male with history of hypertension, COPD (not on any medications with no flare of symptoms in the past 10 years, quit smoking 15 years back) and hyperlipidemia presented to the ED with shortness of breath for the past 2 days with throat congestion and productive cough with yellowish to greenish expectoration. Denied any chest pain, fevers, chills or sick contact. In the ED patient was found to be wheezy and was given nebulizer and Solu-Medrol. Admitted to hospitalist service. ABG showed normal pH with PO2 of 69 and PCO2 of 46. Had mild leukocytosis. Short mild elevated troponin of 0.06. Chest x-ray was unremarkable. Flu PCR was negative.   Hospital Course:  Acute bronchitis versus acute exacerbation of COPD Patient does have history of underlying COPD put has not had any flare of symptoms in the past 10 years. Quit smoking over 15 years ago. Treated with DuoNeb, Pulmicort nebulizer and empiric Levaquin along with IV Solu-Medrol. Symptoms much better this morning. Maintaining O2 sat on room air. No further wheezing on exam. -I will discharge him on oral prednisone for 5 day course along with oral Levaquin to complete 5 day course of antibiotic.  Added antitussives. He should follow-up with his PCP.  Elevated troponin Mild. No EKG changes and no chest pain symptoms. Subsequent troponin was normal.  Hypertension Stable. Continue amlodipine   Procedures:  None  Consultations:  None  Discharge Exam: Filed Vitals:   12/13/15 0330 12/13/15 0447  BP: 126/59 126/50  Pulse: 74 102  Temp:  97.9 F (36.6 C)  Resp: 26 23    General: Elderly male not in distress HEENT: No pallor, moist mucosa Chest: Clear bilaterally no added sounds CVS: Normal S1 and S2, no murmurs rub or gallop Chest: Soft, nondistended, nontender, bowel sounds present musculoskeletal: Warm, no edema  Discharge Instructions    Current Discharge Medication List    START taking these medications   Details  albuterol (PROVENTIL HFA;VENTOLIN HFA) 108 (90 Base) MCG/ACT inhaler Inhale 2 puffs into the lungs every 6 (six) hours as needed for wheezing or shortness of breath. Qty: 1 Inhaler, Refills: 3    guaiFENesin-dextromethorphan (ROBITUSSIN DM) 100-10 MG/5ML syrup Take 5 mLs by mouth every 4 (four) hours as needed for cough. Qty: 118 mL, Refills: 0    levofloxacin (LEVAQUIN) 750 MG tablet Take 1 tablet (750 mg total) by mouth daily. Qty: 4 tablet, Refills: 0    predniSONE (DELTASONE) 20 MG tablet Take 2 tablets (40 mg total) by mouth daily with breakfast. Qty: 10 tablet, Refills: 0      CONTINUE these medications which have NOT CHANGED   Details  amLODipine (NORVASC) 5 MG tablet TAKE 1 TABLET BY MOUTH EVERY DAY Qty: 30 tablet, Refills: 0    finasteride (PROSCAR)  5 MG tablet TAKE 1 TABLET (5 MG TOTAL) BY MOUTH DAILY. Qty: 30 tablet, Refills: 0    lisinopril (PRINIVIL,ZESTRIL) 20 MG tablet TAKE 1 TABLET BY MOUTH EVERY DAY Qty: 30 tablet, Refills: 0   Associated Diagnoses: Essential hypertension    OVER THE COUNTER MEDICATION Take 2 capsules by mouth 2 (two) times daily. PROSTA-STRONG (saw palmetto,lycopene,pumpkin seed extract)     tamsulosin (FLOMAX) 0.4 MG CAPS capsule TAKE ONE CAPSULE BY MOUTH EVERY DAY AFTER SUPPER Qty: 90 capsule, Refills: 0       Allergies  Allergen Reactions  . Penicillins Anaphylaxis   Follow-up Information    Follow up with [facility] BETH, PA-C. Schedule an appointment as soon as possible for a visit in 1 week.   Specialty:  Physician Assistant   Contact information:   [address] [place] [address] [phone]        The results of significant diagnostics from this hospitalization (including imaging, microbiology, ancillary and laboratory) are listed below for reference.    Significant Diagnostic Studies: Dg Chest 2 View  12/13/2015  CLINICAL DATA:  82 year old male with cough and shortness of breath EXAM: CHEST  2 VIEW COMPARISON:  Chest CT dated 08/21/2010 and radiograph dated 08/18/2010 FINDINGS: Two views of the chest do not demonstrate a focal consolidation. There is no pleural effusion or pneumothorax. There is blunting of left costophrenic angle as seen on the prior CT and related to underlying scarring. Top-normal cardiac size. Degenerative changes of the spine. No acute osseous pathology. IMPRESSION: No active cardiopulmonary disease. Electronically Signed   By: Elgie Collard M.D.   On: 12/13/2015 02:29    Microbiology: No results found for this or any previous visit (from the past 240 hour(s)).   Labs: Basic Metabolic Panel:  Recent Labs Lab 12/13/15 0035 12/13/15 0511  NA 138 136  K 3.8 3.1*  CL 101 99*  CO2 26 26  GLUCOSE 146* 205*  BUN 12 14  CREATININE 0.95 1.14  CALCIUM 9.4 9.2   Liver Function Tests:  Recent Labs Lab 12/13/15 0511  AST 31  ALT 13*  ALKPHOS 56  BILITOT 1.1  PROT 6.4*  ALBUMIN 2.9*   No results for input(s): LIPASE, AMYLASE in the last 168 hours. No results for input(s): AMMONIA in the last 168 hours. CBC:  Recent Labs Lab 12/13/15 0035 12/13/15 0511  WBC 13.7* 10.8*  NEUTROABS 11.6* 10.4*  HGB 14.0  13.5  HCT 41.5 41.5  MCV 87.7 87.7  PLT 190 191   Cardiac Enzymes:  Recent Labs Lab 12/13/15 0035 12/13/15 0511  TROPONINI 0.06* <0.03   BNP: BNP (last 3 results)  Recent Labs  12/13/15 0110  BNP 119.9*    ProBNP (last 3 results) No results for input(s): PROBNP in the last 8760 hours.  CBG: No results for input(s): GLUCAP in the last 168 hours.     Signed:  Eddie North MD.  [facility] 12/13/2015, 10:14 AM

## 2015-12-13 NOTE — Progress Notes (Signed)
Attempted to call for report.  Was put on hold for over 5 minutes.  Will try again later.

## 2015-12-13 NOTE — ED Notes (Signed)
While checking pts vital signs pt noted to be 75-83% on RA. Pt states that he does not use oxygen.

## 2015-12-13 NOTE — ED Notes (Signed)
Pt c/o runny nose and prod cough x 3 days.

## 2015-12-13 NOTE — Progress Notes (Addendum)
Patient refused to take his 10 am medicines, wants to go home and takes them.  Took Solu-Medrol, and potassium chloride. Dr. Bevely Palmer aware

## 2015-12-13 NOTE — H&P (Signed)
[facility] History and Physical  DEMON VOLANTE ZOX:[id number] DOB: 07-18-1934 DOA: 12/13/2015  Referring physician: Dr. Wilkie Aye. PCP: Frazier Richards, PA-C  Specialists: None.  Chief Complaint: Shortness of breath.  HPI: Kenneth Woods is a 82 y.o. male history of hypertension presents to the ER because of shortness of breath. Patient states over the last 2 days patient has been getting short of breath throat congestion and productive cough. Denies any chest pain. Patient's shortness of breath acutely worsened today. In the ER patient was found to be wheezing for which patient was given nebulizer and steroid. Patient's troponin is mildly elevated. Denies any chest pain. Patient has been admitted for further management of COPD exacerbation and elevated troponin.  Review of Systems: As presented in the history of presenting illness, rest negative.  Past Medical History  Diagnosis Date  . H/O prostate biopsy   . Elevated PSA 10/16/2010  . Hyperlipidemia   . Hypertension   . COPD (chronic obstructive pulmonary disease) (HCC)   . Arthritis   . Hemorrhoids, external   . BPH (benign prostatic hypertrophy) with urinary obstruction   . Back pain, chronic    Past Surgical History  Procedure Laterality Date  . Fracture surgery  1959    Work accident-caused FX whole body expect rt arm,was in body cast- was working on roof- hosp for 5 mos.  . Inguinal hernia repair Bilateral 1983  . Prostate biopsy N/A 06/20/2013    Procedure: PROSTATE BIOPSY;  Surgeon: Garnett Farm, MD;  Location: [facility];  Service: Urology;  Laterality: N/A;  1 hour req for this case  ULTRASOUND EQUIPHMENT TO BE PROVIDED BY [facility]   Social History:  reports that he quit smoking about 10 years ago. His smoking use included Cigarettes. He has a 30 pack-year smoking history. He has never used smokeless tobacco. He reports that he drinks alcohol. He reports that he does not use illicit  drugs. Where does patient live At home. Can patient participate in ADLs? Yes.  Allergies  Allergen Reactions  . Penicillins Anaphylaxis    Family History:  Family History  Problem Relation Age of Onset  . CVA Mother   . Cancer Father     brain tumor      Prior to Admission medications   Medication Sig Start Date End Date Taking? Authorizing Provider  amLODipine (NORVASC) 5 MG tablet TAKE 1 TABLET BY MOUTH EVERY DAY 11/15/15   Dorena Bodo, PA-C  finasteride (PROSCAR) 5 MG tablet TAKE 1 TABLET (5 MG TOTAL) BY MOUTH DAILY. 11/15/15   Patriciaann Clan Dixon, PA-C  lisinopril (PRINIVIL,ZESTRIL) 20 MG tablet TAKE 1 TABLET BY MOUTH EVERY DAY 09/29/15   Dorena Bodo, PA-C  OVER THE COUNTER MEDICATION Take 2 capsules by mouth 2 (two) times daily. PROSTA-STRONG (saw palmetto,lycopene,pumpkin seed extract)    Historical Provider, MD  tamsulosin (FLOMAX) 0.4 MG CAPS capsule TAKE ONE CAPSULE BY MOUTH EVERY DAY AFTER SUPPER 06/18/15   Dorena Bodo, PA-C    Physical Exam: Filed Vitals:   12/13/15 0230 12/13/15 0300 12/13/15 0315 12/13/15 0330  BP: 128/57 128/65 122/64 126/59  Pulse: 78 74 74 74  Temp:      TempSrc:      Resp: SpO2: 93% 93% 95% 91%     General:  Moderately built and nourished.  Eyes: Anicteric. No pallor.  ENT: No discharge from the ears eyes nose or mouth.  Neck: No JVD appreciated. No  mass felt.  Cardiovascular: S1 and S2 heard.  Respiratory: Mild expiratory wheezes and no crepitations.  Abdomen: Soft nontender bowel sounds present.  Skin: No rash.  Musculoskeletal: No edema.  Psychiatric: Appears normal.  Neurologic: Alert awake oriented to time place and person. Moves all extremities.  Labs on Admission:  Basic Metabolic Panel:  Recent Labs Lab 12/13/15 0035  NA 138  K 3.8  CL 101  CO2 26  GLUCOSE 146*  BUN 12  CREATININE 0.95  CALCIUM 9.4   Liver Function Tests: No results for input(s): AST, ALT, ALKPHOS, BILITOT, PROT, ALBUMIN in the  last 168 hours. No results for input(s): LIPASE, AMYLASE in the last 168 hours. No results for input(s): AMMONIA in the last 168 hours. CBC:  Recent Labs Lab 12/13/15 0035  WBC 13.7*  NEUTROABS 11.6*  HGB 14.0  HCT 41.5  MCV 87.7  PLT 190   Cardiac Enzymes:  Recent Labs Lab 12/13/15 0035  TROPONINI 0.06*    BNP (last 3 results)  Recent Labs  12/13/15 0110  BNP 119.9*    ProBNP (last 3 results) No results for input(s): PROBNP in the last 8760 hours.  CBG: No results for input(s): GLUCAP in the last 168 hours.  Radiological Exams on Admission: Dg Chest 2 View  12/13/2015  CLINICAL DATA:  82 year old male with cough and shortness of breath EXAM: CHEST  2 VIEW COMPARISON:  Chest CT dated 08/21/2010 and radiograph dated 08/18/2010 FINDINGS: Two views of the chest do not demonstrate a focal consolidation. There is no pleural effusion or pneumothorax. There is blunting of left costophrenic angle as seen on the prior CT and related to underlying scarring. Top-normal cardiac size. Degenerative changes of the spine. No acute osseous pathology. IMPRESSION: No active cardiopulmonary disease. Electronically Signed   By: Elgie Collard M.D.   On: 12/13/2015 02:29    EKG: Independently reviewed. Normal sinus rhythm with PVCs.  Assessment/Plan Active Problems:   Hypertension   COPD exacerbation (HCC)   Elevated troponin   1. COPD exacerbation - patient has been placed on nebulizer Pulmicort antibiotics IV steroids and will check influenza PCR. 2. Elevated troponin - patient denies any chest pain. Given the acute worsening we will cycle cardiac markers check 2-D echo and patient is on aspirin. 3. Hypertension - continue amlodipine and lisinopril.   DVT Prophylaxis Lovenox.  Code Status: Full code.  Family Communication: Discussed with patient.  Disposition Plan: Admit to inpatient.    KAKRAKANDY,ARSHAD N. [facility] Pager [phone].  If 7PM-7AM, please  contact night-coverage [email or web address] Password [facility] 12/13/2015, 4:24 AM

## 2015-12-13 NOTE — Progress Notes (Signed)
Patient Discharge: Disposition: Patient discharged to home. Education: Reviewed medications, prescriptions, follow-up appointments, and discharge instructions, understood and acknowledged. IV: Discontinued before discharge. Telemetry: N/A Transportation: Patient transported in w/c with staff and son accompanying him out of the unit. Belongings: Patient took all his belongings with him.

## 2015-12-13 NOTE — ED Provider Notes (Signed)
CSN: [id number]     Arrival date & time 12/12/15  2358 History  By signing my name below, I, Marisue Humble, attest that this documentation has been prepared under the direction and in the presence of Shon Baton, MD . Electronically Signed: Marisue Humble, Scribe. 12/13/2015. 12:21 AM.   Chief Complaint  Patient presents with  . Nasal Congestion   The history is provided by the patient. No language interpreter was used.   HPI Comments:  Kenneth Woods is a 82 y.o. male with PMHx of HLD, HTN, and COPD who presents to the Emergency Department complaining of nasal congestion and persistent, dry cough for the past two days. Pt reports associated shortness of breath and difficulty sleeping secondary to SOB. No alleviating factors noted. Pt states he only takes cough drops and his regular medications; he does not use oxygen or nebulizer at home. Pt denies fever, chills, chest pain, nausea, vomiting, diarrhea, or leg swelling.  Upon arrival, patient O2 sat noted be 81%.  Past Medical History  Diagnosis Date  . H/O prostate biopsy   . Elevated PSA 10/16/2010  . Hyperlipidemia   . Hypertension   . COPD (chronic obstructive pulmonary disease) (HCC)   . Arthritis   . Hemorrhoids, external   . BPH (benign prostatic hypertrophy) with urinary obstruction   . Back pain, chronic    Past Surgical History  Procedure Laterality Date  . Fracture surgery  1959    Work accident-caused FX whole body expect rt arm,was in body cast- was working on roof- hosp for 5 mos.  . Inguinal hernia repair Bilateral 1983  . Prostate biopsy N/A 06/20/2013    Procedure: PROSTATE BIOPSY;  Surgeon: Garnett Farm, MD;  Location: [facility];  Service: Urology;  Laterality: N/A;  1 hour req for this case  ULTRASOUND EQUIPHMENT TO BE PROVIDED BY [facility]   Family History  Problem Relation Age of Onset  . CVA Mother   . Cancer Father     brain tumor   Social History  Substance Use  Topics  . Smoking status: Former Smoker -- 1.00 packs/day for 30 years    Types: Cigarettes    Quit date: 06/09/2005  . Smokeless tobacco: Never Used  . Alcohol Use: Yes     Comment: OCCASIONAL    Review of Systems  Constitutional: Negative for fever and chills.  HENT: Positive for congestion.   Respiratory: Positive for cough and shortness of breath.   Cardiovascular: Negative for chest pain and leg swelling.  Gastrointestinal: Negative for nausea, vomiting and diarrhea.  Psychiatric/Behavioral: Positive for sleep disturbance.  All other systems reviewed and are negative.  Allergies  Penicillins  Home Medications   Prior to Admission medications   Medication Sig Start Date End Date Taking? Authorizing Provider  amLODipine (NORVASC) 5 MG tablet TAKE 1 TABLET BY MOUTH EVERY DAY 11/15/15   Dorena Bodo, PA-C  finasteride (PROSCAR) 5 MG tablet TAKE 1 TABLET (5 MG TOTAL) BY MOUTH DAILY. 11/15/15   Patriciaann Clan Dixon, PA-C  lisinopril (PRINIVIL,ZESTRIL) 20 MG tablet TAKE 1 TABLET BY MOUTH EVERY DAY 09/29/15   Dorena Bodo, PA-C  OVER THE COUNTER MEDICATION Take 2 capsules by mouth 2 (two) times daily. PROSTA-STRONG (saw palmetto,lycopene,pumpkin seed extract)    Historical Provider, MD  tamsulosin (FLOMAX) 0.4 MG CAPS capsule TAKE ONE CAPSULE BY MOUTH EVERY DAY AFTER SUPPER 06/18/15   Dorena Bodo, PA-C   BP 149/61 mmHg  Pulse 89  Temp(Src) 98.1 F (36.7 C) (Oral)  Resp 23  SpO2 93% Physical Exam  Constitutional: He is oriented to person, place, and time. No distress.  HENT:  Head: Normocephalic and atraumatic.  Cardiovascular: Normal rate, regular rhythm and normal heart sounds.   No murmur heard. Pulmonary/Chest: Effort normal. No respiratory distress. He has no wheezes.  Mild tachypnea, nasal cannula in place, fair air movement, scant wheeze  Abdominal: Soft. Bowel sounds are normal. There is no tenderness. There is no rebound.  Musculoskeletal: He exhibits edema.  Trace bilateral  lower extremity edema  Neurological: He is alert and oriented to person, place, and time.  Skin: Skin is warm and dry.  Psychiatric: He has a normal mood and affect.  Nursing note and vitals reviewed.   ED Course  Procedures  DIAGNOSTIC STUDIES:  Oxygen Saturation is 81% on RA, low by my interpretation.    COORDINATION OF CARE:  12:17 AM Will order chest x-ray and lab work. Will administer nebulizer and prednisone injection. Discussed treatment plan with pt at bedside and pt agreed to plan.  Labs Review Labs Reviewed  CBC WITH DIFFERENTIAL/PLATELET - Abnormal; Notable for the following:    WBC 13.7 (*)    Neutro Abs 11.6 (*)    All other components within normal limits  BASIC METABOLIC PANEL - Abnormal; Notable for the following:    Glucose, Bld 146 (*)    All other components within normal limits  BRAIN NATRIURETIC PEPTIDE - Abnormal; Notable for the following:    B Natriuretic Peptide 119.9 (*)    All other components within normal limits  TROPONIN I - Abnormal; Notable for the following:    Troponin I 0.06 (*)    All other components within normal limits  I-STAT ARTERIAL BLOOD GAS, ED - Abnormal; Notable for the following:    pCO2 arterial 46.4 (*)    pO2, Arterial 69.0 (*)    Bicarbonate 27.8 (*)    All other components within normal limits    Imaging Review Dg Chest 2 View  12/13/2015  CLINICAL DATA:  82 year old male with cough and shortness of breath EXAM: CHEST  2 VIEW COMPARISON:  Chest CT dated 08/21/2010 and radiograph dated 08/18/2010 FINDINGS: Two views of the chest do not demonstrate a focal consolidation. There is no pleural effusion or pneumothorax. There is blunting of left costophrenic angle as seen on the prior CT and related to underlying scarring. Top-normal cardiac size. Degenerative changes of the spine. No acute osseous pathology. IMPRESSION: No active cardiopulmonary disease. Electronically Signed   By: Elgie Collard M.D.   On: 12/13/2015 02:29    I have personally reviewed and evaluated these images and lab results as part of my medical decision-making.   EKG Interpretation   Date/Time:  Monday December 13 2015 00:26:45 EST Ventricular Rate:  84 PR Interval:  222 QRS Duration: 95 QT Interval:  369 QTC Calculation: 436 R Axis:   -76 Text Interpretation:  Sinus rhythm Paired ventricular premature complexes  Prolonged PR interval Consider right atrial enlargement Left anterior  fascicular block Borderline low voltage, extremity leads Consider anterior  infarct Changes since last EKG including LAFB Reconfirmed by Sonnet Rizor  MD,  Siddalee Vanderheiden ([id number]) on 12/13/2015 2:48:27 AM      MDM   Final diagnoses:  COPD exacerbation (HCC)  Elevated troponin    Patient presents with cough and shortness of breath. Ongoing for the last 2 days. History of COPD but does not take any medications at home. Noted to  be 75-83% on room air upon arrival. He is in no acute distress. He has fair air movement with some wheezing on exam. Patient was given a DuoNeb and Solu-Medrol. Basic labwork obtained. He does have a leukocytosis to 13.7. EKG shows a new left anterior fascicular block as well as PVCs. Otherwise is nonischemic. Chest x-ray shows no evidence of pneumonia. He is afebrile. Lab work is also notable for a PO2 of 69 on a VBG. Troponin is 0.06. Patient is not actively having any chest pain. He was given an aspirin.  On recheck, patient reports improvement after a DuoNeb. He is still requiring supplemental oxygen and drops his oxygen saturations to the mid 80s. Additional breathing treatments ordered. Will admit for COPD exacerbation. He will also need serial enzymes given mildly elevated troponin. At this time I do not feel it is consistent with ACS and more likely a COPD exacerbation.  I personally performed the services described in this documentation, which was scribed in my presence. The recorded information has been reviewed and is  accurate.    Shon Baton, MD 12/13/15 [phone]

## 2015-12-13 NOTE — Progress Notes (Signed)
Pharmacy Antibiotic Note  Kenneth Woods is a 82 y.o. male admitted on 12/13/2015 with COPD.  Pharmacy has been consulted for Levaquin dosing.  Plan: Levaquin  IV q24h Will f/u renal function, micro data, and pt's clinical condition     Temp (24hrs), Avg:98.1 F (36.7 C), Min:98.1 F (36.7 C), Max:98.1 F (36.7 C)   Recent Labs Lab 12/13/15 0035  WBC 13.7*  CREATININE 0.95    CrCl cannot be calculated (Unknown ideal weight.).    Allergies  Allergen Reactions  . Penicillins Anaphylaxis    Antimicrobials this admission: 2/27 Levaquin >>   Thank you for allowing pharmacy to be a part of this patient's care.  Kenneth Woods, PharmD, BCPS Clinical pharmacist, pager [phone] 12/13/2015 4:45 AM

## 2015-12-13 NOTE — Telephone Encounter (Signed)
Pt in hosp at this time.  Refill denied as medications may change on discharge

## 2015-12-13 NOTE — Progress Notes (Signed)
  Echocardiogram 2D Echocardiogram has been performed.  Arvil Chaco 12/13/2015, 2:20 PM

## 2018-02-18 ENCOUNTER — Other Ambulatory Visit: Payer: Self-pay

## 2018-02-18 ENCOUNTER — Emergency Department (HOSPITAL_COMMUNITY)
Admission: EM | Admit: 2018-02-18 | Discharge: 2018-02-18 | Disposition: A | Discharge status: Home / Self Care | Payer: Medicare Other | Attending: Emergency Medicine | Admitting: Emergency Medicine

## 2018-02-18 ENCOUNTER — Encounter (HOSPITAL_COMMUNITY): Payer: Self-pay | Admitting: Emergency Medicine

## 2018-02-18 DIAGNOSIS — R339 Retention of urine, unspecified: Secondary | ICD-10-CM | POA: Diagnosis present

## 2018-02-18 DIAGNOSIS — N390 Urinary tract infection, site not specified: Secondary | ICD-10-CM | POA: Diagnosis not present

## 2018-02-18 DIAGNOSIS — Z87891 Personal history of nicotine dependence: Secondary | ICD-10-CM | POA: Insufficient documentation

## 2018-02-18 DIAGNOSIS — I10 Essential (primary) hypertension: Secondary | ICD-10-CM | POA: Insufficient documentation

## 2018-02-18 DIAGNOSIS — Z79899 Other long term (current) drug therapy: Secondary | ICD-10-CM | POA: Insufficient documentation

## 2018-02-18 DIAGNOSIS — J449 Chronic obstructive pulmonary disease, unspecified: Secondary | ICD-10-CM | POA: Diagnosis not present

## 2018-02-18 LAB — URINALYSIS, ROUTINE W REFLEX MICROSCOPIC
Bilirubin Urine: NEGATIVE
Glucose, UA: NEGATIVE mg/dL
Ketones, ur: NEGATIVE mg/dL
Nitrite: NEGATIVE
Protein, ur: NEGATIVE mg/dL
Specific Gravity, Urine: 1.005 (ref 1.005–1.030)
pH: 8 (ref 5.0–8.0)

## 2018-02-18 MED ORDER — CIPROFLOXACIN HCL 250 MG PO TABS
500.0000 mg | ORAL_TABLET | Freq: Once | ORAL | Status: AC
  Administered 2018-02-18: 500 mg via ORAL
  Filled 2018-02-18: qty 2

## 2018-02-18 MED ORDER — CIPROFLOXACIN HCL 500 MG PO TABS: 500.0000 mg | ORAL_TABLET | Freq: Two times a day (BID) | ORAL | 0 refills | Status: DC

## 2018-02-18 NOTE — ED Provider Notes (Signed)
[facility] EMERGENCY DEPARTMENT Provider Note   CSN: [id number] Arrival date & time: 02/18/18  1326     History   Chief Complaint Chief Complaint  Patient presents with  . Urinary Retention    HPI Kenneth Woods is a 84 y.o. male.  Patient has been unable to urinate since early this morning.  He has a previous history of urinary retention secondary to probable BPH.  He has not felt sick lately.  He allegedly has not taken his Flomax recently.  No fever, sweats, chills, flank pain.  Severity of discomfort is moderate to severe.  Nothing makes symptoms better or worse     Past Medical History:  Diagnosis Date  . Arthritis   . Back pain, chronic   . BPH (benign prostatic hypertrophy) with urinary obstruction   . COPD (chronic obstructive pulmonary disease) (HCC)   . Elevated PSA 10/16/2010  . H/O prostate biopsy   . Hemorrhoids, external   . Hyperlipidemia   . Hypertension     Patient Active Problem List   Diagnosis Date Noted  . COPD exacerbation (HCC) 12/13/2015  . Elevated troponin 12/13/2015  . Hypokalemia 12/13/2015  . Retention of urine   . Hyperlipidemia   . Hypertension   . COPD (chronic obstructive pulmonary disease) (HCC)   . Arthritis   . BPH with elevated PSA   . Elevated PSA 10/16/2010    Past Surgical History:  Procedure Laterality Date  . FRACTURE SURGERY  1959   Work accident-caused FX whole body expect rt arm,was in body cast- was working on roof- hosp for 5 mos.  . INGUINAL HERNIA REPAIR Bilateral 1983  . PROSTATE BIOPSY N/A 06/20/2013   Procedure: PROSTATE BIOPSY;  Surgeon: Garnett Farm, MD;  Location: [facility];  Service: Urology;  Laterality: N/A;  1 hour req for this case  ULTRASOUND EQUIPHMENT TO BE PROVIDED BY [facility]        Home Medications    Prior to Admission medications   Medication Sig Start Date End Date Taking? Authorizing Provider  albuterol (PROVENTIL HFA;VENTOLIN HFA) 108 (90 Base) MCG/ACT  inhaler Inhale 2 puffs into the lungs every 6 (six) hours as needed for wheezing or shortness of breath. 12/13/15   Dhungel, Nishant, MD  amLODipine (NORVASC) 5 MG tablet TAKE 1 TABLET BY MOUTH EVERY DAY 11/15/15   Allayne Butcher B, PA-C  finasteride (PROSCAR) 5 MG tablet TAKE 1 TABLET (5 MG TOTAL) BY MOUTH DAILY. 11/15/15   Dixon, Mary B, PA-C  guaiFENesin-dextromethorphan (ROBITUSSIN DM) 100-10 MG/5ML syrup Take 5 mLs by mouth every 4 (four) hours as needed for cough. 12/13/15   Dhungel, Nishant, MD  lisinopril (PRINIVIL,ZESTRIL) 20 MG tablet TAKE 1 TABLET BY MOUTH EVERY DAY 09/29/15   Allayne Butcher B, PA-C  OVER THE COUNTER MEDICATION Take 2 capsules by mouth 2 (two) times daily. PROSTA-STRONG (saw palmetto,lycopene,pumpkin seed extract)    [provider]  tamsulosin (FLOMAX) 0.4 MG CAPS capsule TAKE ONE CAPSULE BY MOUTH EVERY DAY AFTER SUPPER 06/18/15   Dorena Bodo, PA-C    Family History Family History  Problem Relation Age of Onset  . CVA Mother   . Cancer Father        brain tumor    Social History Social History   Tobacco Use  . Smoking status: Former Smoker    Packs/day: 1.00    Years: 30.00    Pack years: 30.00    Types: Cigarettes    Last attempt  to quit: 06/09/2005    Years since quitting: 12.7  . Smokeless tobacco: Never Used  Substance Use Topics  . Alcohol use: Yes    Comment: OCCASIONAL  . Drug use: No     Allergies   Penicillins   Review of Systems Review of Systems  All other systems reviewed and are negative.    Physical Exam Updated Vital Signs BP (!) 94/56   Pulse 71   Temp (!) 97.3 F (36.3 C) (Oral)   Resp 16   Wt 70.3 kg (155 lb)   SpO2 96%   BMI 22.89 kg/m   Physical Exam  Constitutional: He is oriented to person, place, and time. He appears well-developed and well-nourished.  HENT:  Head: Normocephalic and atraumatic.  Eyes: Conjunctivae are normal.  Neck: Neck supple.  Cardiovascular: Normal rate and regular rhythm.    Pulmonary/Chest: Effort normal and breath sounds normal.  Abdominal: Soft. Bowel sounds are normal.  No suprapubic tenderness.  Genitourinary:  Genitourinary Comments: No CVAT  Musculoskeletal: Normal range of motion.  Neurological: He is alert and oriented to person, place, and time.  Skin: Skin is warm and dry.  Psychiatric: He has a normal mood and affect. His behavior is normal.  Nursing note and vitals reviewed.    ED Treatments / Results  Labs (all labs ordered are listed, but only abnormal results are displayed) Labs Reviewed  URINALYSIS, ROUTINE W REFLEX MICROSCOPIC - Abnormal; Notable for the following components:      Result Value   Color, Urine STRAW (*)    Hgb urine dipstick MODERATE (*)    Leukocytes, UA TRACE (*)    Bacteria, UA RARE (*)    All other components within normal limits  URINE CULTURE    EKG None  Radiology No results found.  Procedures Procedures (including critical care time)  Medications Ordered in ED Medications  ciprofloxacin (CIPRO) tablet 500 mg (500 mg Oral Given 02/18/18 1424)     Initial Impression / Assessment and Plan / ED Course  I have reviewed the triage vital signs and the nursing notes.  Pertinent labs & imaging results that were available during my care of the patient were reviewed by me and considered in my medical decision making (see chart for details).     Patient presents with urinary retention.  Bladder scan revealed 1000 mL's.  Foley catheter was inserted without difficulty.  Patient drained a considerable amount of urine.  Urinalysis shows evidence of infection.  Will leave Foley catheter intact.  Start Cipro.  Urine culture.  Follow-up with urology this week.  Final Clinical Impressions(s) / ED Diagnoses   Final diagnoses:  Urinary retention  Urinary tract infection without hematuria, site unspecified    ED Discharge Orders    None       Donnetta Hutching, MD 02/18/18 1435

## 2018-02-18 NOTE — Discharge Instructions (Addendum)
Call urology for follow-up appointment.  Phone number given.  Keep the Foley catheter in place until you see the urologist.  Prescription for antibiotic.

## 2018-02-18 NOTE — ED Triage Notes (Signed)
Pt c/o of urinary retention.  Pt states "I tried to pee this morning but I couldn't"

## 2018-02-20 LAB — URINE CULTURE: Culture: NO GROWTH

## 2018-02-21 ENCOUNTER — Encounter (HOSPITAL_COMMUNITY): Payer: Self-pay | Admitting: Emergency Medicine

## 2018-02-21 ENCOUNTER — Other Ambulatory Visit: Payer: Self-pay

## 2018-02-21 ENCOUNTER — Emergency Department (HOSPITAL_COMMUNITY)
Admission: EM | Admit: 2018-02-21 | Discharge: 2018-02-22 | Disposition: A | Discharge status: Home / Self Care | Payer: Medicare Other | Attending: Emergency Medicine | Admitting: Emergency Medicine

## 2018-02-21 DIAGNOSIS — Z79899 Other long term (current) drug therapy: Secondary | ICD-10-CM | POA: Diagnosis not present

## 2018-02-21 DIAGNOSIS — Z87891 Personal history of nicotine dependence: Secondary | ICD-10-CM | POA: Diagnosis not present

## 2018-02-21 DIAGNOSIS — R339 Retention of urine, unspecified: Secondary | ICD-10-CM | POA: Insufficient documentation

## 2018-02-21 DIAGNOSIS — I10 Essential (primary) hypertension: Secondary | ICD-10-CM | POA: Diagnosis not present

## 2018-02-21 DIAGNOSIS — J449 Chronic obstructive pulmonary disease, unspecified: Secondary | ICD-10-CM | POA: Insufficient documentation

## 2018-02-21 NOTE — ED Triage Notes (Signed)
Pt c/o lower abd pain and states his catheter is not draining.

## 2018-02-21 NOTE — ED Notes (Signed)
Bladder Scan Performed Post Foley Insertion. residual urine measured. Bladder still draining. Total Output currently urine.

## 2018-02-21 NOTE — ED Provider Notes (Signed)
[place] [facility]   CSN: [id number] Arrival date & time: 02/21/18  2302     History   Chief Complaint Chief Complaint  Patient presents with  . Urinary Retention    HPI Kenneth Woods is a 84 y.o. male.  Patient was seen 4 days ago for urinary retention and had a catheter placed.  He reports that over the course of this evening and tonight first he started seeing blood and then the catheter stopped draining.  He is experiencing progressively worsening discomfort in the area of his bladder.  No fever, nausea, vomiting.     Past Medical History:  Diagnosis Date  . Arthritis   . Back pain, chronic   . BPH (benign prostatic hypertrophy) with urinary obstruction   . COPD (chronic obstructive pulmonary disease) (HCC)   . Elevated PSA 10/16/2010  . H/O prostate biopsy   . Hemorrhoids, external   . Hyperlipidemia   . Hypertension     Patient Active Problem List   Diagnosis Date Noted  . COPD exacerbation (HCC) 12/13/2015  . Elevated troponin 12/13/2015  . Hypokalemia 12/13/2015  . Retention of urine   . Hyperlipidemia   . Hypertension   . COPD (chronic obstructive pulmonary disease) (HCC)   . Arthritis   . BPH with elevated PSA   . Elevated PSA 10/16/2010    Past Surgical History:  Procedure Laterality Date  . FRACTURE SURGERY  1959   Work accident-caused FX whole body expect rt arm,was in body cast- was working on roof- hosp for 5 mos.  . INGUINAL HERNIA REPAIR Bilateral 1983  . PROSTATE BIOPSY N/A 06/20/2013   Procedure: PROSTATE BIOPSY;  Surgeon: Garnett Farm, MD;  Location: [facility];  Service: Urology;  Laterality: N/A;  1 hour req for this case  ULTRASOUND EQUIPHMENT TO BE PROVIDED BY [facility]        Home Medications    Prior to Admission medications   Medication Sig Start Date End Date Taking? Authorizing Provider  albuterol (PROVENTIL HFA;VENTOLIN HFA) 108 (90 Base) MCG/ACT inhaler Inhale 2  puffs into the lungs every 6 (six) hours as needed for wheezing or shortness of breath. 12/13/15   Dhungel, Theda Belfast, MD  amLODipine (NORVASC) 5 MG tablet TAKE 1 TABLET BY MOUTH EVERY DAY 11/15/15   Allayne Butcher B, PA-C  ciprofloxacin (CIPRO) 500 MG tablet Take 1 tablet (500 mg total) by mouth 2 (two) times daily. 02/18/18   Donnetta Hutching, MD  finasteride (PROSCAR) 5 MG tablet TAKE 1 TABLET (5 MG TOTAL) BY MOUTH DAILY. 11/15/15   Dixon, Mary B, PA-C  guaiFENesin-dextromethorphan (ROBITUSSIN DM) 100-10 MG/5ML syrup Take 5 mLs by mouth every 4 (four) hours as needed for cough. 12/13/15   Dhungel, Nishant, MD  lisinopril (PRINIVIL,ZESTRIL) 20 MG tablet TAKE 1 TABLET BY MOUTH EVERY DAY 09/29/15   Allayne Butcher B, PA-C  OVER THE COUNTER MEDICATION Take 2 capsules by mouth 2 (two) times daily. PROSTA-STRONG (saw palmetto,lycopene,pumpkin seed extract)    [provider]  tamsulosin (FLOMAX) 0.4 MG CAPS capsule TAKE ONE CAPSULE BY MOUTH EVERY DAY AFTER SUPPER 06/18/15   Dorena Bodo, PA-C    Family History Family History  Problem Relation Age of Onset  . CVA Mother   . Cancer Father        brain tumor    Social History Social History   Tobacco Use  . Smoking status: Former Smoker    Packs/day: 1.00  Years: 30.00    Pack years: 30.00    Types: Cigarettes    Last attempt to quit: 06/09/2005    Years since quitting: 12.7  . Smokeless tobacco: Never Used  Substance Use Topics  . Alcohol use: Yes    Comment: OCCASIONAL  . Drug use: No     Allergies   Penicillins   Review of Systems Review of Systems  Genitourinary: Positive for decreased urine volume. Negative for flank pain.  All other systems reviewed and are negative.    Physical Exam Updated Vital Signs BP (!) 198/122   Pulse 94   Resp (!) 28   Wt 70.3 kg (155 lb)   SpO2 96%   BMI 22.89 kg/m   Physical Exam  Constitutional: He is oriented to person, place, and time. He appears well-developed and well-nourished. No  distress.  HENT:  Head: Normocephalic and atraumatic.  Right Ear: Hearing normal.  Left Ear: Hearing normal.  Nose: Nose normal.  Mouth/Throat: Oropharynx is clear and moist and mucous membranes are normal.  Eyes: Pupils are equal, round, and reactive to light. Conjunctivae and EOM are normal.  Neck: Normal range of motion. Neck supple.  Cardiovascular: Regular rhythm, S1 normal and S2 normal. Exam reveals no gallop and no friction rub.  No murmur heard. Pulmonary/Chest: Effort normal and breath sounds normal. No respiratory distress. He exhibits no tenderness.  Abdominal: Soft. Normal appearance and bowel sounds are normal. He exhibits mass (distended bladder). There is no hepatosplenomegaly. There is tenderness (suprapubic). There is no rebound, no guarding, no tenderness at McBurney's point and negative Murphy's sign. No hernia.  Musculoskeletal: Normal range of motion.  Neurological: He is alert and oriented to person, place, and time. He has normal strength. No cranial nerve deficit or sensory deficit. Coordination normal. GCS eye subscore is 4. GCS verbal subscore is 5. GCS motor subscore is 6.  Skin: Skin is warm, dry and intact. No rash noted. No cyanosis.  Psychiatric: He has a normal mood and affect. His speech is normal and behavior is normal. Thought content normal.  Nursing note and vitals reviewed.    ED Treatments / Results  Labs (all labs ordered are listed, but only abnormal results are displayed) Labs Reviewed - No data to display  EKG None  Radiology No results found.  Procedures Procedures (including critical care time)  Medications Ordered in ED Medications - No data to display   Initial Impression / Assessment and Plan / ED Course  I have reviewed the triage vital signs and the nursing notes.  Pertinent labs & imaging results that were available during my care of the patient were reviewed by me and considered in my medical decision making (see chart for  details).     Patient presented with a problem with Foley catheter.  Patient has recently been diagnosed with urinary retention, had a catheter placed 4 days ago.  Tonight the catheter stopped draining.  There appeared to be a small blood clot in the catheter that caused it to clogged.  Catheter was changed out and greater than 1 L of clear urine was drained from his bladder with resolution of his discomfort.  Final Clinical Impressions(s) / ED Diagnoses   Final diagnoses:  Urinary retention    ED Discharge Orders    None       Gilda Crease, MD 02/21/18 2348

## 2018-02-21 NOTE — ED Notes (Signed)
Bladder Scan Performed. >913ml residual urine measured

## 2019-01-07 DIAGNOSIS — M79644 Pain in right finger(s): Secondary | ICD-10-CM | POA: Diagnosis not present

## 2019-01-07 DIAGNOSIS — G8929 Other chronic pain: Secondary | ICD-10-CM | POA: Diagnosis not present

## 2019-01-07 DIAGNOSIS — M79645 Pain in left finger(s): Secondary | ICD-10-CM | POA: Diagnosis not present

## 2019-02-12 DIAGNOSIS — M1811 Unilateral primary osteoarthritis of first carpometacarpal joint, right hand: Secondary | ICD-10-CM | POA: Diagnosis not present

## 2019-02-12 DIAGNOSIS — S62022K Displaced fracture of middle third of navicular [scaphoid] bone of left wrist, subsequent encounter for fracture with nonunion: Secondary | ICD-10-CM | POA: Diagnosis not present

## 2019-02-12 DIAGNOSIS — M1812 Unilateral primary osteoarthritis of first carpometacarpal joint, left hand: Secondary | ICD-10-CM | POA: Diagnosis not present

## 2019-03-19 DIAGNOSIS — M180 Bilateral primary osteoarthritis of first carpometacarpal joints: Secondary | ICD-10-CM | POA: Diagnosis not present

## 2019-03-19 DIAGNOSIS — S62022K Displaced fracture of middle third of navicular [scaphoid] bone of left wrist, subsequent encounter for fracture with nonunion: Secondary | ICD-10-CM | POA: Diagnosis not present

## 2019-06-02 ENCOUNTER — Other Ambulatory Visit: Payer: Self-pay

## 2019-06-02 ENCOUNTER — Emergency Department (HOSPITAL_COMMUNITY): Payer: Medicare Other

## 2019-06-02 ENCOUNTER — Emergency Department (HOSPITAL_COMMUNITY)
Admission: EM | Admit: 2019-06-02 | Discharge: 2019-06-02 | Disposition: A | Discharge status: Home / Self Care | Payer: Medicare Other | Attending: Emergency Medicine | Admitting: Emergency Medicine

## 2019-06-02 ENCOUNTER — Encounter (HOSPITAL_COMMUNITY): Payer: Self-pay

## 2019-06-02 DIAGNOSIS — Y9389 Activity, other specified: Secondary | ICD-10-CM | POA: Insufficient documentation

## 2019-06-02 DIAGNOSIS — J449 Chronic obstructive pulmonary disease, unspecified: Secondary | ICD-10-CM | POA: Diagnosis not present

## 2019-06-02 DIAGNOSIS — E161 Other hypoglycemia: Secondary | ICD-10-CM | POA: Diagnosis not present

## 2019-06-02 DIAGNOSIS — Z79899 Other long term (current) drug therapy: Secondary | ICD-10-CM | POA: Diagnosis not present

## 2019-06-02 DIAGNOSIS — S0990XA Unspecified injury of head, initial encounter: Secondary | ICD-10-CM | POA: Diagnosis not present

## 2019-06-02 DIAGNOSIS — Z87891 Personal history of nicotine dependence: Secondary | ICD-10-CM | POA: Diagnosis not present

## 2019-06-02 DIAGNOSIS — I10 Essential (primary) hypertension: Secondary | ICD-10-CM | POA: Diagnosis not present

## 2019-06-02 DIAGNOSIS — I491 Atrial premature depolarization: Secondary | ICD-10-CM | POA: Diagnosis not present

## 2019-06-02 DIAGNOSIS — E162 Hypoglycemia, unspecified: Secondary | ICD-10-CM | POA: Diagnosis not present

## 2019-06-02 DIAGNOSIS — Y998 Other external cause status: Secondary | ICD-10-CM | POA: Diagnosis not present

## 2019-06-02 DIAGNOSIS — F10920 Alcohol use, unspecified with intoxication, uncomplicated: Secondary | ICD-10-CM | POA: Diagnosis not present

## 2019-06-02 DIAGNOSIS — Y929 Unspecified place or not applicable: Secondary | ICD-10-CM | POA: Insufficient documentation

## 2019-06-02 DIAGNOSIS — R4182 Altered mental status, unspecified: Secondary | ICD-10-CM | POA: Diagnosis present

## 2019-06-02 DIAGNOSIS — I499 Cardiac arrhythmia, unspecified: Secondary | ICD-10-CM | POA: Diagnosis not present

## 2019-06-02 DIAGNOSIS — R51 Headache: Secondary | ICD-10-CM | POA: Diagnosis not present

## 2019-06-02 DIAGNOSIS — R404 Transient alteration of awareness: Secondary | ICD-10-CM | POA: Diagnosis not present

## 2019-06-02 DIAGNOSIS — Y906 Blood alcohol level of 120-199 mg/100 ml: Secondary | ICD-10-CM | POA: Diagnosis not present

## 2019-06-02 DIAGNOSIS — S199XXA Unspecified injury of neck, initial encounter: Secondary | ICD-10-CM | POA: Diagnosis not present

## 2019-06-02 DIAGNOSIS — M542 Cervicalgia: Secondary | ICD-10-CM | POA: Diagnosis not present

## 2019-06-02 LAB — RAPID URINE DRUG SCREEN, HOSP PERFORMED
Amphetamines: NOT DETECTED
Barbiturates: NOT DETECTED
Benzodiazepines: NOT DETECTED
Cocaine: NOT DETECTED
Opiates: NOT DETECTED
Tetrahydrocannabinol: NOT DETECTED

## 2019-06-02 LAB — COMPREHENSIVE METABOLIC PANEL
ALT: 11 U/L (ref 0–44)
AST: 20 U/L (ref 15–41)
Albumin: 3.5 g/dL (ref 3.5–5.0)
Alkaline Phosphatase: 57 U/L (ref 38–126)
Anion gap: 8 (ref 5–15)
BUN: 11 mg/dL (ref 8–23)
CO2: 24 mmol/L (ref 22–32)
Calcium: 9.2 mg/dL (ref 8.9–10.3)
Chloride: 110 mmol/L (ref 98–111)
Creatinine, Ser: 1.05 mg/dL (ref 0.61–1.24)
GFR calc Af Amer: >60 mL/min (ref >60)
GFR calc non Af Amer: >60 mL/min (ref >60)
Glucose, Bld: 105 mg/dL — ABNORMAL HIGH (ref 70–99)
Potassium: 3.5 mmol/L (ref 3.5–5.1)
Sodium: 142 mmol/L (ref 135–145)
Total Bilirubin: 0.5 mg/dL (ref 0.3–1.2)
Total Protein: 6.7 g/dL (ref 6.5–8.1)

## 2019-06-02 LAB — CBC WITH DIFFERENTIAL/PLATELET
Abs Immature Granulocytes: 0.01 10*3/uL (ref 0.00–0.07)
Basophils Absolute: 0 10*3/uL (ref 0.0–0.1)
Basophils Relative: 1 %
Eosinophils Absolute: 0.1 10*3/uL (ref 0.0–0.5)
Eosinophils Relative: 2 %
HCT: 46.1 % (ref 39.0–52.0)
Hemoglobin: 15 g/dL (ref 13.0–17.0)
Immature Granulocytes: 0 %
Lymphocytes Relative: 28 %
Lymphs Abs: 1.1 10*3/uL (ref 0.7–4.0)
MCH: 29.5 pg (ref 26.0–34.0)
MCHC: 32.5 g/dL (ref 30.0–36.0)
MCV: 90.6 fL (ref 80.0–100.0)
Monocytes Absolute: 0.3 10*3/uL (ref 0.1–1.0)
Monocytes Relative: 8 %
Neutro Abs: 2.5 10*3/uL (ref 1.7–7.7)
Neutrophils Relative %: 61 %
Platelets: 210 10*3/uL (ref 150–400)
RBC: 5.09 MIL/uL (ref 4.22–5.81)
RDW: 13.4 % (ref 11.5–15.5)
WBC: 4.1 10*3/uL (ref 4.0–10.5)
nRBC: 0 % (ref 0.0–0.2)

## 2019-06-02 LAB — I-STAT CHEM 8, ED
BUN: 12 mg/dL (ref 8–23)
Calcium, Ion: 1.19 mmol/L (ref 1.15–1.40)
Chloride: 109 mmol/L (ref 98–111)
Creatinine, Ser: 1.1 mg/dL (ref 0.61–1.24)
Glucose, Bld: 101 mg/dL — ABNORMAL HIGH (ref 70–99)
HCT: 44 % (ref 39.0–52.0)
Hemoglobin: 15 g/dL (ref 13.0–17.0)
Potassium: 3.5 mmol/L (ref 3.5–5.1)
Sodium: 145 mmol/L (ref 135–145)
TCO2: 23 mmol/L (ref 22–32)

## 2019-06-02 LAB — URINALYSIS, ROUTINE W REFLEX MICROSCOPIC
Bilirubin Urine: NEGATIVE
Glucose, UA: NEGATIVE mg/dL
Hgb urine dipstick: NEGATIVE
Ketones, ur: NEGATIVE mg/dL
Nitrite: NEGATIVE
Protein, ur: NEGATIVE mg/dL
Specific Gravity, Urine: 1.009 (ref 1.005–1.030)
pH: 6 (ref 5.0–8.0)

## 2019-06-02 LAB — CBG MONITORING, ED: Glucose-Capillary: 77 mg/dL (ref 70–99)

## 2019-06-02 LAB — ETHANOL: Alcohol, Ethyl (B): 161 mg/dL — ABNORMAL HIGH (ref <10)
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
## 2019-06-02 NOTE — ED Notes (Signed)
Patient transported to CT 
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
## 2019-06-02 NOTE — ED Notes (Signed)
Patient verbalizes understanding of discharge instructions. Opportunity for questioning and answers were provided. Armband removed by staff, pt discharged from ED.  

## 2019-06-02 NOTE — ED Notes (Signed)
Pt ambulated inside the room. Steady gait. Stable on feet. O2Sat 95 %
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
## 2019-06-16 ENCOUNTER — Encounter (HOSPITAL_COMMUNITY): Payer: Self-pay | Admitting: *Deleted

## 2019-06-16 ENCOUNTER — Emergency Department (HOSPITAL_COMMUNITY)
Admission: EM | Admit: 2019-06-16 | Discharge: 2019-06-16 | Disposition: A | Discharge status: Home / Self Care | Payer: Medicare Other | Attending: Emergency Medicine | Admitting: Emergency Medicine

## 2019-06-16 DIAGNOSIS — I10 Essential (primary) hypertension: Secondary | ICD-10-CM | POA: Diagnosis not present

## 2019-06-16 DIAGNOSIS — H538 Other visual disturbances: Secondary | ICD-10-CM | POA: Diagnosis not present

## 2019-06-16 DIAGNOSIS — Z87891 Personal history of nicotine dependence: Secondary | ICD-10-CM | POA: Diagnosis not present

## 2019-06-16 DIAGNOSIS — H5712 Ocular pain, left eye: Secondary | ICD-10-CM | POA: Diagnosis not present

## 2019-06-16 DIAGNOSIS — Z79899 Other long term (current) drug therapy: Secondary | ICD-10-CM | POA: Insufficient documentation

## 2019-06-16 DIAGNOSIS — J449 Chronic obstructive pulmonary disease, unspecified: Secondary | ICD-10-CM | POA: Insufficient documentation

## 2019-06-16 MED ORDER — FLUORESCEIN SODIUM 1 MG OP STRP
1.0000 | ORAL_STRIP | Freq: Once | OPHTHALMIC | Status: DC
  Filled 2019-06-16: qty 1

## 2019-06-16 MED ORDER — TETRACAINE HCL 0.5 % OP SOLN
2.0000 [drp] | Freq: Once | OPHTHALMIC | Status: AC
  Administered 2019-06-16: 2 [drp] via OPHTHALMIC
  Filled 2019-06-16: qty 4
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
## 2019-11-24 DIAGNOSIS — H547 Unspecified visual loss: Secondary | ICD-10-CM | POA: Diagnosis not present

## 2020-02-10 DIAGNOSIS — H35342 Macular cyst, hole, or pseudohole, left eye: Secondary | ICD-10-CM | POA: Diagnosis not present

## 2020-02-23 DIAGNOSIS — H353133 Nonexudative age-related macular degeneration, bilateral, advanced atrophic without subfoveal involvement: Secondary | ICD-10-CM | POA: Diagnosis not present

## 2020-02-23 DIAGNOSIS — H43813 Vitreous degeneration, bilateral: Secondary | ICD-10-CM | POA: Diagnosis not present

## 2020-02-23 DIAGNOSIS — H2513 Age-related nuclear cataract, bilateral: Secondary | ICD-10-CM | POA: Diagnosis not present

## 2020-02-23 DIAGNOSIS — H43823 Vitreomacular adhesion, bilateral: Secondary | ICD-10-CM | POA: Diagnosis not present

## 2020-08-25 ENCOUNTER — Encounter (HOSPITAL_COMMUNITY): Payer: Self-pay

## 2020-08-25 ENCOUNTER — Emergency Department (HOSPITAL_COMMUNITY)
Admission: EM | Admit: 2020-08-25 | Discharge: 2020-08-25 | Disposition: A | Discharge status: Home / Self Care | Payer: Medicare Other | Attending: Emergency Medicine | Admitting: Emergency Medicine

## 2020-08-25 DIAGNOSIS — I10 Essential (primary) hypertension: Secondary | ICD-10-CM | POA: Diagnosis not present

## 2020-08-25 DIAGNOSIS — R339 Retention of urine, unspecified: Secondary | ICD-10-CM

## 2020-08-25 DIAGNOSIS — J441 Chronic obstructive pulmonary disease with (acute) exacerbation: Secondary | ICD-10-CM | POA: Insufficient documentation

## 2020-08-25 DIAGNOSIS — Z87891 Personal history of nicotine dependence: Secondary | ICD-10-CM | POA: Insufficient documentation

## 2020-08-25 DIAGNOSIS — Z79899 Other long term (current) drug therapy: Secondary | ICD-10-CM | POA: Diagnosis not present

## 2020-08-25 LAB — URINALYSIS, ROUTINE W REFLEX MICROSCOPIC
Bilirubin Urine: NEGATIVE
Glucose, UA: NEGATIVE mg/dL
Ketones, ur: NEGATIVE mg/dL
Nitrite: NEGATIVE
Protein, ur: NEGATIVE mg/dL
Specific Gravity, Urine: 1.005 (ref 1.005–1.030)
WBC, UA: >50 WBC/hpf — ABNORMAL HIGH (ref 0–5)
pH: 7 (ref 5.0–8.0)

## 2020-08-25 MED ORDER — CIPROFLOXACIN HCL 250 MG PO TABS: 250.0000 mg | ORAL_TABLET | Freq: Two times a day (BID) | ORAL | 0 refills | Status: DC

## 2020-08-25 MED ORDER — CIPROFLOXACIN HCL 250 MG PO TABS: 250.0000 mg | ORAL_TABLET | Freq: Two times a day (BID) | ORAL | 0 refills | Status: AC

## 2020-08-25 NOTE — Discharge Instructions (Addendum)
You were given a prescription for antibiotics to treat a bladder infection. Please take the antibiotic prescription fully.   A culture was sent of your urine today to determine if there is any bacterial growth. If the results of the culture are positive and you require an antibiotic or a change of your prescribed antibiotic you will be contacted by the hospital. If the results are negative you will not be contacted.  Please follow up with your urologist within the next 5-7 days to have your foley catheter removed.   Please return to the emergency department for any new or worsening symptoms.

## 2020-08-25 NOTE — ED Provider Notes (Signed)
[name] Provider Note   CSN: [id number] Arrival date & time: 08/25/20  2041     History Chief Complaint  Patient presents with  . Urinary Retention    Kenneth Woods is a 86 y.o. male.  HPI   86 year old male history of arthritis, BPH, COPD, hyperlipidemia, hypertension, who presents to the emergency department today for evaluation of urinary retention.  States earlier today around 5 PM he started experiencing urinary retention.  He has had this many times in the past and follows with urology.  Denies any associated fevers, nausea vomiting or other systemic symptoms.  Past Medical History:  Diagnosis Date  . Arthritis   . Back pain, chronic   . BPH (benign prostatic hypertrophy) with urinary obstruction   . COPD (chronic obstructive pulmonary disease) ([facility])   . Elevated PSA 10/16/2010  . H/O prostate biopsy   . Hemorrhoids, external   . Hyperlipidemia   . Hypertension     Patient Active Problem List   Diagnosis Date Noted  . COPD exacerbation ([facility]) 12/13/2015  . Elevated troponin 12/13/2015  . Hypokalemia 12/13/2015  . Retention of urine   . Hyperlipidemia   . Hypertension   . COPD (chronic obstructive pulmonary disease) ([facility])   . Arthritis   . BPH with elevated PSA   . Elevated PSA 10/16/2010    Past Surgical History:  Procedure Laterality Date  . FRACTURE SURGERY  1959   Work accident-caused FX whole body expect rt arm,was in body cast- was working on roof- hosp for 5 mos.  . INGUINAL HERNIA REPAIR Bilateral 1983  . PROSTATE BIOPSY N/A 06/20/2013   Procedure: PROSTATE BIOPSY;  Surgeon: Garnett Farm, MD;  Location: [facility];  Service: Urology;  Laterality: N/A;  1 hour req for this case  ULTRASOUND EQUIPHMENT TO BE PROVIDED BY [facility]       Family History  Problem Relation Age of Onset  . CVA Mother   . Cancer Father        brain tumor    Social History   Tobacco Use  .  Smoking status: Former Smoker    Packs/day: 1.00    Years: 30.00    Pack years: 30.00    Types: Cigarettes    Quit date: 06/09/2005    Years since quitting: 15.2  . Smokeless tobacco: Never Used  Substance Use Topics  . Alcohol use: Yes    Comment: OCCASIONAL  . Drug use: No    Home Medications Prior to Admission medications   Medication Sig Start Date End Date Taking? Authorizing Provider  albuterol (PROVENTIL HFA;VENTOLIN HFA) 108 (90 Base) MCG/ACT inhaler Inhale 2 puffs into the lungs every 6 (six) hours as needed for wheezing or shortness of breath. 12/13/15   Dhungel, Theda Belfast, MD  amLODipine (NORVASC) 5 MG tablet TAKE 1 TABLET BY MOUTH EVERY DAY 11/15/15   Dorena Bodo, PA-C  ciprofloxacin (CIPRO) 250 MG tablet Take 1 tablet (250 mg total) by mouth 2 (two) times daily for 5 days. 08/25/20 08/30/20  Swade Shonka S, PA-C  finasteride (PROSCAR) 5 MG tablet TAKE 1 TABLET (5 MG TOTAL) BY MOUTH DAILY. 11/15/15   Dixon, Mary B, PA-C  guaiFENesin-dextromethorphan (ROBITUSSIN DM) 100-10 MG/5ML syrup Take 5 mLs by mouth every 4 (four) hours as needed for cough. 12/13/15   Dhungel, Theda Belfast, MD  lisinopril (PRINIVIL,ZESTRIL) 20 MG tablet TAKE 1 TABLET BY MOUTH EVERY DAY 09/29/15   Dorena Bodo, PA-C  OVER THE COUNTER MEDICATION Take 2 capsules by mouth 2 (two) times daily. PROSTA-STRONG (saw palmetto,lycopene,pumpkin seed extract)    [provider]  tamsulosin (FLOMAX) 0.4 MG CAPS capsule TAKE ONE CAPSULE BY MOUTH EVERY DAY AFTER SUPPER 06/18/15   Dorena Bodo, PA-C    Allergies    Penicillins  Review of Systems   Review of Systems  Constitutional: Negative for chills and fever.  HENT: Negative for ear pain and sore throat.   Eyes: Negative for visual disturbance.  Respiratory: Negative for cough and shortness of breath.   Cardiovascular: Negative for chest pain.  Gastrointestinal: Negative for abdominal pain, constipation, diarrhea, nausea and vomiting.  Genitourinary:  Positive for difficulty urinating. Negative for dysuria, frequency, hematuria and urgency.  Musculoskeletal: Negative for back pain.  Skin: Negative for rash.  Neurological: Negative for seizures and syncope.  All other systems reviewed and are negative.   Physical Exam Updated Vital Signs BP 127/61 (BP Location: Right Arm)   Pulse (!) 53   Temp 97.8 F (36.6 C) (Oral)   Resp 16   SpO2 93%   Physical Exam Vitals and nursing note reviewed.  Constitutional:      Appearance: He is well-developed.  HENT:     Head: Normocephalic and atraumatic.  Eyes:     Conjunctiva/sclera: Conjunctivae normal.  Cardiovascular:     Rate and Rhythm: Normal rate and regular rhythm.     Heart sounds: Normal heart sounds. No murmur heard.   Pulmonary:     Effort: Pulmonary effort is normal. No respiratory distress.     Breath sounds: Normal breath sounds. No wheezing, rhonchi or rales.  Abdominal:     General: Bowel sounds are normal.     Palpations: Abdomen is soft.     Tenderness: There is no abdominal tenderness. There is no guarding or rebound.  Musculoskeletal:     Cervical back: Neck supple.  Skin:    General: Skin is warm and dry.  Neurological:     Mental Status: He is alert.     ED Results / Procedures / Treatments   Labs (all labs ordered are listed, but only abnormal results are displayed) Labs Reviewed  URINALYSIS, ROUTINE W REFLEX MICROSCOPIC - Abnormal; Notable for the following components:      Result Value   Color, Urine STRAW (*)    APPearance HAZY (*)    Hgb urine dipstick MODERATE (*)    Leukocytes,Ua LARGE (*)    WBC, UA >50 (*)    Bacteria, UA RARE (*)    All other components within normal limits  URINE CULTURE    EKG None  Radiology No results found.  Procedures Procedures (including critical care time)  Medications Ordered in ED Medications - No data to display  ED Course  I have reviewed the triage vital signs and the nursing notes.  Pertinent  labs & imaging results that were available during my care of the patient were reviewed by me and considered in my medical decision making (see chart for details).    MDM Rules/Calculators/A&P                          86 y/o M presenting for eval of urinary retention. H/o BPH.   Bladder scan completed and showed >900 cc in the bladder. Foley catheter was placed and pt had 1200cc output.  UA with hematuria, large leukocytes, 6-10 rbcs, >50 wbc, and rare bacteria. Urine culture was sent.  Pt given abx for home.   He was advised to f/u with urology. Pt and family advised on close monitoring of urine output over the next 24 hours and discussed strict return precautions. They voiced understanding of the plan and reasons to return. All questions answered, pt stable for discharge.   Final Clinical Impression(s) / ED Diagnoses Final diagnoses:  Urinary retention    Rx / DC Orders ED Discharge Orders         Ordered    ciprofloxacin (CIPRO) 250 MG tablet  2 times daily,   Status:  Discontinued        08/25/20 2254    ciprofloxacin (CIPRO) 250 MG tablet  2 times daily        08/25/20 2259           Karrie Meres, PA-C 08/25/20 2337    Jacalyn Lefevre, MD 08/25/20 2346

## 2020-08-25 NOTE — ED Notes (Signed)
Dc instructions reviewed with patient and family and both verbalized understanding.

## 2020-08-25 NOTE — ED Triage Notes (Signed)
Pt reports that he has been having urinary retention today, unable to void since this afternoon. Hx of the same in the past

## 2020-08-27 ENCOUNTER — Emergency Department (HOSPITAL_COMMUNITY)
Admission: EM | Admit: 2020-08-27 | Discharge: 2020-08-28 | Disposition: A | Discharge status: Home / Self Care | Payer: Medicare Other | Attending: Emergency Medicine | Admitting: Emergency Medicine

## 2020-08-27 ENCOUNTER — Encounter (HOSPITAL_COMMUNITY): Payer: Self-pay | Admitting: Emergency Medicine

## 2020-08-27 ENCOUNTER — Other Ambulatory Visit: Payer: Self-pay

## 2020-08-27 DIAGNOSIS — T83098A Other mechanical complication of other indwelling urethral catheter, initial encounter: Secondary | ICD-10-CM | POA: Diagnosis not present

## 2020-08-27 DIAGNOSIS — T839XXA Unspecified complication of genitourinary prosthetic device, implant and graft, initial encounter: Secondary | ICD-10-CM

## 2020-08-27 DIAGNOSIS — R338 Other retention of urine: Secondary | ICD-10-CM | POA: Insufficient documentation

## 2020-08-27 DIAGNOSIS — R319 Hematuria, unspecified: Secondary | ICD-10-CM

## 2020-08-27 DIAGNOSIS — Z460 Encounter for fitting and adjustment of spectacles and contact lenses: Secondary | ICD-10-CM | POA: Diagnosis not present

## 2020-08-27 DIAGNOSIS — E876 Hypokalemia: Secondary | ICD-10-CM | POA: Diagnosis not present

## 2020-08-27 DIAGNOSIS — N401 Enlarged prostate with lower urinary tract symptoms: Secondary | ICD-10-CM | POA: Diagnosis not present

## 2020-08-27 LAB — URINE CULTURE: Culture: NO GROWTH

## 2020-08-27 NOTE — ED Triage Notes (Signed)
Patient requesting replacement of his urinary drainage leg bag that fell off this evening , indwelling catheter is intact .

## 2020-08-28 ENCOUNTER — Encounter (HOSPITAL_COMMUNITY): Payer: Self-pay | Admitting: Emergency Medicine

## 2020-08-28 DIAGNOSIS — T83098A Other mechanical complication of other indwelling urethral catheter, initial encounter: Secondary | ICD-10-CM | POA: Diagnosis not present

## 2020-08-28 DIAGNOSIS — E876 Hypokalemia: Secondary | ICD-10-CM | POA: Diagnosis not present

## 2020-08-28 LAB — I-STAT CHEM 8, ED
BUN: 17 mg/dL (ref 8–23)
Calcium, Ion: 1.23 mmol/L (ref 1.15–1.40)
Chloride: 101 mmol/L (ref 98–111)
Creatinine, Ser: 1.2 mg/dL (ref 0.61–1.24)
Glucose, Bld: 107 mg/dL — ABNORMAL HIGH (ref 70–99)
HCT: 40 % (ref 39.0–52.0)
Hemoglobin: 13.6 g/dL (ref 13.0–17.0)
Potassium: 3.6 mmol/L (ref 3.5–5.1)
Sodium: 141 mmol/L (ref 135–145)
TCO2: 25 mmol/L (ref 22–32)

## 2020-08-28 LAB — CBC WITH DIFFERENTIAL/PLATELET
Abs Immature Granulocytes: 0.02 10*3/uL (ref 0.00–0.07)
Basophils Absolute: 0 10*3/uL (ref 0.0–0.1)
Basophils Relative: 0 %
Eosinophils Absolute: 0.1 10*3/uL (ref 0.0–0.5)
Eosinophils Relative: 2 %
HCT: 43 % (ref 39.0–52.0)
Hemoglobin: 13.7 g/dL (ref 13.0–17.0)
Immature Granulocytes: 0 %
Lymphocytes Relative: 21 %
Lymphs Abs: 1.1 10*3/uL (ref 0.7–4.0)
MCH: 29.3 pg (ref 26.0–34.0)
MCHC: 31.9 g/dL (ref 30.0–36.0)
MCV: 91.9 fL (ref 80.0–100.0)
Monocytes Absolute: 0.5 10*3/uL (ref 0.1–1.0)
Monocytes Relative: 10 %
Neutro Abs: 3.2 10*3/uL (ref 1.7–7.7)
Neutrophils Relative %: 67 %
Platelets: 157 10*3/uL (ref 150–400)
RBC: 4.68 MIL/uL (ref 4.22–5.81)
RDW: 13.2 % (ref 11.5–15.5)
WBC: 4.9 10*3/uL (ref 4.0–10.5)
nRBC: 0 % (ref 0.0–0.2)

## 2020-08-28 MED ORDER — TAMSULOSIN HCL 0.4 MG PO CAPS
0.4000 mg | ORAL_CAPSULE | ORAL | Status: AC
  Administered 2020-08-28: 0.4 mg via ORAL
  Filled 2020-08-28: qty 1

## 2020-08-28 MED ORDER — LIDOCAINE HCL URETHRAL/MUCOSAL 2 % EX GEL
1.0000 "application " | Freq: Once | CUTANEOUS | Status: AC
  Administered 2020-08-28: 1 via TOPICAL
  Filled 2020-08-28: qty 11

## 2020-08-28 MED ORDER — TAMSULOSIN HCL 0.4 MG PO CAPS
0.4000 mg | ORAL_CAPSULE | Freq: Every day | ORAL | 0 refills | Status: DC
Start: 2020-08-28 — End: 2022-02-28

## 2020-08-28 NOTE — ED Provider Notes (Signed)
[facility] EMERGENCY DEPARTMENT Provider Note   CSN: [id number] Arrival date & time: 08/27/20  2230     History Chief Complaint  Patient presents with  . Leg ( urinary) Bag replacement    Kenneth Woods is a 86 y.o. male.  The history is provided by the patient.  Illness Location:  Bladder  Quality:  Foley bag became dislodged and patient knotted foley now it wont drain and there is blood  Severity:  Moderate Onset quality:  Gradual Duration:  1 day Timing:  Constant Progression:  Unchanged Chronicity:  Recurrent Context:  Indwelling foley  Relieved by:  Nothing  Worsened by:  Time  Ineffective treatments:  None Associated symptoms: no abdominal pain, no chest pain, no congestion, no cough, no diarrhea, no ear pain, no fatigue, no fever, no headaches, no loss of consciousness, no myalgias, no nausea, no rash, no rhinorrhea, no shortness of breath, no sore throat, no vomiting and no wheezing   Risk factors:  Elderly      Past Medical History:  Diagnosis Date  . Arthritis   . Back pain, chronic   . BPH (benign prostatic hypertrophy) with urinary obstruction   . COPD (chronic obstructive pulmonary disease) (HCC)   . Elevated PSA 10/16/2010  . H/O prostate biopsy   . Hemorrhoids, external   . Hyperlipidemia   . Hypertension     Patient Active Problem List   Diagnosis Date Noted  . COPD exacerbation (HCC) 12/13/2015  . Elevated troponin 12/13/2015  . Hypokalemia 12/13/2015  . Retention of urine   . Hyperlipidemia   . Hypertension   . COPD (chronic obstructive pulmonary disease) (HCC)   . Arthritis   . BPH with elevated PSA   . Elevated PSA 10/16/2010    Past Surgical History:  Procedure Laterality Date  . FRACTURE SURGERY  1959   Work accident-caused FX whole body expect rt arm,was in body cast- was working on roof- hosp for 5 mos.  . INGUINAL HERNIA REPAIR Bilateral 1983  . PROSTATE BIOPSY N/A 06/20/2013   Procedure: PROSTATE BIOPSY;   Surgeon: Garnett Farm, MD;  Location: [facility];  Service: Urology;  Laterality: N/A;  1 hour req for this case  ULTRASOUND EQUIPHMENT TO BE PROVIDED BY [facility]       Family History  Problem Relation Age of Onset  . CVA Mother   . Cancer Father        brain tumor    Social History   Tobacco Use  . Smoking status: Former Smoker    Packs/day: 1.00    Years: 30.00    Pack years: 30.00    Types: Cigarettes    Quit date: 06/09/2005    Years since quitting: 15.2  . Smokeless tobacco: Never Used  Substance Use Topics  . Alcohol use: Yes    Comment: OCCASIONAL  . Drug use: No    Home Medications Prior to Admission medications   Medication Sig Start Date End Date Taking? Authorizing Provider  albuterol (PROVENTIL HFA;VENTOLIN HFA) 108 (90 Base) MCG/ACT inhaler Inhale 2 puffs into the lungs every 6 (six) hours as needed for wheezing or shortness of breath. 12/13/15   Dhungel, Theda Belfast, MD  amLODipine (NORVASC) 5 MG tablet TAKE 1 TABLET BY MOUTH EVERY DAY 11/15/15   Dorena Bodo, PA-C  ciprofloxacin (CIPRO) 250 MG tablet Take 1 tablet (250 mg total) by mouth 2 (two) times daily for 5 days. 08/25/20 08/30/20  Couture, Cortni S,  PA-C  finasteride (PROSCAR) 5 MG tablet TAKE 1 TABLET (5 MG TOTAL) BY MOUTH DAILY. 11/15/15   Dixon, Mary B, PA-C  guaiFENesin-dextromethorphan (ROBITUSSIN DM) 100-10 MG/5ML syrup Take 5 mLs by mouth every 4 (four) hours as needed for cough. 12/13/15   Dhungel, Nishant, MD  lisinopril (PRINIVIL,ZESTRIL) 20 MG tablet TAKE 1 TABLET BY MOUTH EVERY DAY 09/29/15   Allayne Butcher B, PA-C  OVER THE COUNTER MEDICATION Take 2 capsules by mouth 2 (two) times daily. PROSTA-STRONG (saw palmetto,lycopene,pumpkin seed extract)    [provider]  tamsulosin (FLOMAX) 0.4 MG CAPS capsule TAKE ONE CAPSULE BY MOUTH EVERY DAY AFTER SUPPER 06/18/15   Dorena Bodo, PA-C    Allergies    Penicillins  Review of Systems   Review of Systems    Constitutional: Negative for fatigue and fever.  HENT: Negative for congestion, ear pain, rhinorrhea and sore throat.   Eyes: Negative for visual disturbance.  Respiratory: Negative for cough, shortness of breath and wheezing.   Cardiovascular: Negative for chest pain.  Gastrointestinal: Negative for abdominal pain, diarrhea, nausea and vomiting.  Genitourinary: Positive for difficulty urinating.  Musculoskeletal: Negative for myalgias.  Skin: Negative for rash.  Neurological: Negative for loss of consciousness and headaches.  Psychiatric/Behavioral: Negative for agitation.  All other systems reviewed and are negative.   Physical Exam Updated Vital Signs BP (!) 160/76 (BP Location: Left Arm)   Pulse 70   Temp 97.7 F (36.5 C) (Oral)   Resp 16   SpO2 91%   Physical Exam Vitals reviewed.  Constitutional:      Appearance: Normal appearance. He is not ill-appearing.  HENT:     Head: Normocephalic and atraumatic.     Nose: Nose normal.  Eyes:     Extraocular Movements: Extraocular movements intact.     Conjunctiva/sclera: Conjunctivae normal.     Pupils: Pupils are equal, round, and reactive to light.  Cardiovascular:     Rate and Rhythm: Normal rate and regular rhythm.     Pulses: Normal pulses.     Heart sounds: Normal heart sounds.  Pulmonary:     Effort: Pulmonary effort is normal.     Breath sounds: Normal breath sounds.  Abdominal:     General: Abdomen is flat. Bowel sounds are normal.     Palpations: Abdomen is soft.     Tenderness: There is no abdominal tenderness. There is no guarding.  Musculoskeletal:        General: Normal range of motion.     Cervical back: Normal range of motion and neck supple.  Skin:    General: Skin is warm and dry.     Capillary Refill: Capillary refill takes less than 2 seconds.  Neurological:     General: No focal deficit present.     Mental Status: He is alert and oriented to person, place, and time.     Deep Tendon Reflexes:  Reflexes normal.  Psychiatric:        Mood and Affect: Mood normal.        Behavior: Behavior normal.     ED Results / Procedures / Treatments   Labs (all labs ordered are listed, but only abnormal results are displayed) Labs Reviewed  I-STAT CHEM 8, ED    EKG None  Radiology No results found.  Procedures Procedures (including critical care time)  Medications Ordered in ED Medications  lidocaine (XYLOCAINE) 2 % jelly 1 application (has no administration in time range)    ED Course  I have reviewed the triage vital signs and the nursing notes.  Pertinent labs & imaging results that were available during my care of the patient were reviewed by me and considered in my medical decision making (see chart for details).    Foley unable to be flushed in the ED and not draining.  Replaced in the ED.  Already on antibiotics.  Follow up with urology in 7 days for voiding trial in the office.   Kenneth Woods was evaluated in Emergency Department on 08/28/2020 for the symptoms described in the history of present illness. He was evaluated in the context of the global COVID-19 pandemic, which necessitated consideration that the patient might be at risk for infection with the SARS-CoV-2 virus that causes COVID-19. Institutional protocols and algorithms that pertain to the evaluation of patients at risk for COVID-19 are in a state of rapid change based on information released by regulatory bodies including the CDC and federal and state organizations. These policies and algorithms were followed during the patient's care in the ED.  Final Clinical Impression(s) / ED Diagnoses Return for intractable cough, coughing up blood,fevers >100.4 unrelieved by medication, shortness of breath, intractable vomiting, chest pain, shortness of breath, weakness,numbness, changes in speech, facial asymmetry,abdominal pain, passing out,Inability to tolerate liquids or food, cough, altered mental status  or any concerns. No signs of systemic illness or infection. The patient is nontoxic-appearing on exam and vital signs are within normal limits.   I have reviewed the triage vital signs and the nursing notes. Pertinent labs &imaging results that were available during my care of the patient were reviewed by me and considered in my medical decision making (see chart for details).After history, exam, and medical workup I feel the patient has beenappropriately medically screened and is safe for discharge home. Pertinent diagnoses were discussed with the patient. Patient was given return precautions.   Senay Sistrunk, MD 08/28/20 [phone]

## 2020-08-28 NOTE — ED Notes (Signed)
  Bladder scan showed >300 ml.  Attempted to irrigate the catheter and several clots came out but catheter would not consistently drain urine.  Dr Nicanor Alcon notified.    Replacement coude catheter ordered and will change it out once it arrives.

## 2020-09-01 ENCOUNTER — Emergency Department (HOSPITAL_COMMUNITY)
Admission: EM | Admit: 2020-09-01 | Discharge: 2020-09-01 | Disposition: A | Discharge status: Home / Self Care | Payer: Medicare Other | Attending: Emergency Medicine | Admitting: Emergency Medicine

## 2020-09-01 ENCOUNTER — Encounter (HOSPITAL_COMMUNITY): Payer: Self-pay | Admitting: Emergency Medicine

## 2020-09-01 DIAGNOSIS — Z79899 Other long term (current) drug therapy: Secondary | ICD-10-CM | POA: Diagnosis not present

## 2020-09-01 DIAGNOSIS — T839XXA Unspecified complication of genitourinary prosthetic device, implant and graft, initial encounter: Secondary | ICD-10-CM

## 2020-09-01 DIAGNOSIS — I10 Essential (primary) hypertension: Secondary | ICD-10-CM | POA: Insufficient documentation

## 2020-09-01 DIAGNOSIS — J441 Chronic obstructive pulmonary disease with (acute) exacerbation: Secondary | ICD-10-CM | POA: Diagnosis not present

## 2020-09-01 DIAGNOSIS — Y658 Other specified misadventures during surgical and medical care: Secondary | ICD-10-CM | POA: Diagnosis not present

## 2020-09-01 DIAGNOSIS — R339 Retention of urine, unspecified: Secondary | ICD-10-CM | POA: Diagnosis not present

## 2020-09-01 DIAGNOSIS — Z87891 Personal history of nicotine dependence: Secondary | ICD-10-CM | POA: Diagnosis not present

## 2020-09-01 DIAGNOSIS — T83091A Other mechanical complication of indwelling urethral catheter, initial encounter: Secondary | ICD-10-CM | POA: Diagnosis not present

## 2020-09-01 NOTE — ED Provider Notes (Signed)
[facility] EMERGENCY DEPARTMENT Provider Note   CSN: [id number] Arrival date & time: 09/01/20  4332     History No chief complaint on file.   Kenneth Woods is a 86 y.o. male.  HPI Patient reports he had Foley catheter placed in the emergency department and now he wants it removed.  He was first seen 11\10\2021.  At that time he was having urinary retention and a Foley catheter was placed.  He had 1200 cc urine output.  Also, he had signs of UTI with WBCs present.  He was sent home with antibiotic of ciprofloxacin.  3 days later, the Foley bag apparently became dislodged and the catheter clogged.  The Foley catheter was replaced.  He reports now he is having a lot of pain in his penis.  No urine has drained into the bag for 2 days.  He reports when he needs to go he disconnects of the Foley from the bag and essentially urinates around it.  He reports also he is having drainage and soiling his garments.  He denies he is having any abdominal pain, back pain, fever chills nausea or vomiting.  Reports he is taking his antibiotics as prescribed.    Past Medical History:  Diagnosis Date  . Arthritis   . Back pain, chronic   . BPH (benign prostatic hypertrophy) with urinary obstruction   . COPD (chronic obstructive pulmonary disease) (HCC)   . Elevated PSA 10/16/2010  . H/O prostate biopsy   . Hemorrhoids, external   . Hyperlipidemia   . Hypertension     Patient Active Problem List   Diagnosis Date Noted  . COPD exacerbation (HCC) 12/13/2015  . Elevated troponin 12/13/2015  . Hypokalemia 12/13/2015  . Retention of urine   . Hyperlipidemia   . Hypertension   . COPD (chronic obstructive pulmonary disease) (HCC)   . Arthritis   . BPH with elevated PSA   . Elevated PSA 10/16/2010    Past Surgical History:  Procedure Laterality Date  . FRACTURE SURGERY  1959   Work accident-caused FX whole body expect rt arm,was in body cast- was working on roof- hosp for 5  mos.  . INGUINAL HERNIA REPAIR Bilateral 1983  . PROSTATE BIOPSY N/A 06/20/2013   Procedure: PROSTATE BIOPSY;  Surgeon: Garnett Farm, MD;  Location: [facility];  Service: Urology;  Laterality: N/A;  1 hour req for this case  ULTRASOUND EQUIPHMENT TO BE PROVIDED BY [facility]       Family History  Problem Relation Age of Onset  . CVA Mother   . Cancer Father        brain tumor    Social History   Tobacco Use  . Smoking status: Former Smoker    Packs/day: 1.00    Years: 30.00    Pack years: 30.00    Types: Cigarettes    Quit date: 06/09/2005    Years since quitting: 15.2  . Smokeless tobacco: Never Used  Substance Use Topics  . Alcohol use: Yes    Comment: OCCASIONAL  . Drug use: No    Home Medications Prior to Admission medications   Medication Sig Start Date End Date Taking? Authorizing Provider  albuterol (PROVENTIL HFA;VENTOLIN HFA) 108 (90 Base) MCG/ACT inhaler Inhale 2 puffs into the lungs every 6 (six) hours as needed for wheezing or shortness of breath. 12/13/15   Dhungel, Nishant, MD  amLODipine (NORVASC) 5 MG tablet TAKE 1 TABLET BY MOUTH EVERY DAY 11/15/15  Dixon, Mary B, PA-C  finasteride (PROSCAR) 5 MG tablet TAKE 1 TABLET (5 MG TOTAL) BY MOUTH DAILY. 11/15/15   Dixon, Mary B, PA-C  guaiFENesin-dextromethorphan (ROBITUSSIN DM) 100-10 MG/5ML syrup Take 5 mLs by mouth every 4 (four) hours as needed for cough. 12/13/15   Dhungel, Nishant, MD  lisinopril (PRINIVIL,ZESTRIL) 20 MG tablet TAKE 1 TABLET BY MOUTH EVERY DAY 09/29/15   Allayne Butcher B, PA-C  OVER THE COUNTER MEDICATION Take 2 capsules by mouth 2 (two) times daily. PROSTA-STRONG (saw palmetto,lycopene,pumpkin seed extract)    [provider]  tamsulosin (FLOMAX) 0.4 MG CAPS capsule TAKE ONE CAPSULE BY MOUTH EVERY DAY AFTER SUPPER 06/18/15   Dorena Bodo, PA-C  tamsulosin (FLOMAX) 0.4 MG CAPS capsule Take 1 capsule (0.4 mg total) by mouth daily. 08/28/20   Palumbo, April, MD     Allergies    Penicillins  Review of Systems   Review of Systems 10 systems reviewed and negative except as per HPI Physical Exam Updated Vital Signs BP (!) 173/68   Pulse (!) 50   Temp 97.6 F (36.4 C) (Oral)   Resp 17   Ht 5\' 9"  (1.753 m)   Wt 86.2 kg   SpO2 99%   BMI 28.06 kg/m   Physical Exam Constitutional:      Comments: Alert nontoxic no distress.  Mental status clear.  HENT:     Mouth/Throat:     Pharynx: Oropharynx is clear.  Eyes:     Extraocular Movements: Extraocular movements intact.  Cardiovascular:     Rate and Rhythm: Normal rate and regular rhythm.  Pulmonary:     Comments: No respiratory distress.  Breath sounds are slightly soft at bases. Abdominal:     General: There is no distension.     Palpations: Abdomen is soft.     Tenderness: There is no abdominal tenderness. There is no guarding.  Genitourinary:    Comments: Penis is normal in appearance.  Foley catheter is present.  There is no blood drainage or discharge around the catheter no swelling at the meatus. Musculoskeletal:        General: Normal range of motion.     Right lower leg: No edema.     Left lower leg: No edema.  Skin:    General: Skin is warm and dry.  Neurological:     General: No focal deficit present.     Mental Status: He is oriented to person, place, and time.     Coordination: Coordination normal.  Psychiatric:        Mood and Affect: Mood normal.     ED Results / Procedures / Treatments   Labs (all labs ordered are listed, but only abnormal results are displayed) Labs Reviewed - No data to display  EKG None  Radiology No results found.  Procedures Procedures (including critical care time)  Medications Ordered in ED Medications - No data to display  ED Course  I have reviewed the triage vital signs and the nursing notes.  Pertinent labs & imaging results that were available during my care of the patient were reviewed by me and considered in my  medical decision making (see chart for details).    MDM Rules/Calculators/A&P                          I was able to contact the patient's son by phone.  Patient's daughter's phone was not available for connection at this time.  Also voicemail was not answering.  I reviewed the patient's presentation and visit in the emergency department with the patient's son.  I did advise of the situation of malfunction of the catheter and the potential for need to replace it if he should have repeat episode of urinary retention.  He agrees with the plan to have patient return if and when needed.  He reports he will communicate this to the patient's daughter as well.  By history and clinical findings of 400+ cc in the bladder, catheter is obstructed and malfunctioning.  Patient is urinating around the catheter. Patient developed urinary retention approximately 10 days ago.  He reports this has occasionally occurred.  It happened a number of years ago and he temporarily had a Foley catheter placed.  He reports typically after the catheters come out he is able to urinate again.  He is not sure what precipitates these events.  It has not been a frequent issue and once the catheter is removed he is able to urinate again.  Patient insists that the catheter be removed and not replaced.  We did review the risk of possible repeat retention and the need to return within 12 hours or less for replacement of catheter.  Patient accepts risk of recurrent obstruction and also understands possible kidney damage due to ureteral reflux.  Catheter has been removed and patient has urinated freely since removal of catheter.  Patient is encouraged to follow-up with urology and return precautions reviewed. Final Clinical Impression(s) / ED Diagnoses Final diagnoses:  Problem with Foley catheter, initial encounter [facility])    Rx / DC Orders ED Discharge Orders    None       Arby Barrette, MD 09/01/20 1113

## 2020-09-01 NOTE — ED Notes (Signed)
Bladder scan read more >404 ml in bladder.. foley removed and so far pt has had of output.

## 2020-09-01 NOTE — ED Triage Notes (Signed)
Pt back to the Ed today asking to have his foley catheter removed

## 2020-12-22 ENCOUNTER — Encounter (HOSPITAL_COMMUNITY): Payer: Self-pay | Admitting: Emergency Medicine

## 2020-12-22 ENCOUNTER — Emergency Department (HOSPITAL_COMMUNITY)
Admission: EM | Admit: 2020-12-22 | Discharge: 2020-12-22 | Disposition: A | Discharge status: Home / Self Care | Payer: Medicare Other | Attending: Emergency Medicine | Admitting: Emergency Medicine

## 2020-12-22 ENCOUNTER — Other Ambulatory Visit: Payer: Self-pay

## 2020-12-22 DIAGNOSIS — R339 Retention of urine, unspecified: Secondary | ICD-10-CM

## 2020-12-22 DIAGNOSIS — N390 Urinary tract infection, site not specified: Secondary | ICD-10-CM | POA: Diagnosis not present

## 2020-12-22 DIAGNOSIS — R140 Abdominal distension (gaseous): Secondary | ICD-10-CM | POA: Diagnosis not present

## 2020-12-22 DIAGNOSIS — J441 Chronic obstructive pulmonary disease with (acute) exacerbation: Secondary | ICD-10-CM | POA: Insufficient documentation

## 2020-12-22 DIAGNOSIS — I10 Essential (primary) hypertension: Secondary | ICD-10-CM | POA: Diagnosis not present

## 2020-12-22 DIAGNOSIS — Z87891 Personal history of nicotine dependence: Secondary | ICD-10-CM | POA: Diagnosis not present

## 2020-12-22 DIAGNOSIS — Z79899 Other long term (current) drug therapy: Secondary | ICD-10-CM | POA: Insufficient documentation

## 2020-12-22 LAB — CBC WITH DIFFERENTIAL/PLATELET
Abs Immature Granulocytes: 0.03 10*3/uL (ref 0.00–0.07)
Basophils Absolute: 0 10*3/uL (ref 0.0–0.1)
Basophils Relative: 0 %
Eosinophils Absolute: 0 10*3/uL (ref 0.0–0.5)
Eosinophils Relative: 0 %
HCT: 47.8 % (ref 39.0–52.0)
Hemoglobin: 15.6 g/dL (ref 13.0–17.0)
Immature Granulocytes: 0 %
Lymphocytes Relative: 6 %
Lymphs Abs: 0.5 10*3/uL — ABNORMAL LOW (ref 0.7–4.0)
MCH: 29.5 pg (ref 26.0–34.0)
MCHC: 32.6 g/dL (ref 30.0–36.0)
MCV: 90.4 fL (ref 80.0–100.0)
Monocytes Absolute: 0.3 10*3/uL (ref 0.1–1.0)
Monocytes Relative: 4 %
Neutro Abs: 6.7 10*3/uL (ref 1.7–7.7)
Neutrophils Relative %: 90 %
Platelets: 219 10*3/uL (ref 150–400)
RBC: 5.29 MIL/uL (ref 4.22–5.81)
RDW: 13.5 % (ref 11.5–15.5)
WBC: 7.5 10*3/uL (ref 4.0–10.5)
nRBC: 0 % (ref 0.0–0.2)

## 2020-12-22 LAB — URINALYSIS, ROUTINE W REFLEX MICROSCOPIC
Bilirubin Urine: NEGATIVE
Glucose, UA: NEGATIVE mg/dL
Ketones, ur: NEGATIVE mg/dL
Nitrite: POSITIVE — AB
Protein, ur: NEGATIVE mg/dL
Specific Gravity, Urine: 1.005 (ref 1.005–1.030)
WBC, UA: >50 WBC/hpf — ABNORMAL HIGH (ref 0–5)
pH: 7 (ref 5.0–8.0)

## 2020-12-22 LAB — I-STAT CHEM 8, ED
BUN: 14 mg/dL (ref 8–23)
Calcium, Ion: 1.22 mmol/L (ref 1.15–1.40)
Chloride: 100 mmol/L (ref 98–111)
Creatinine, Ser: 0.7 mg/dL (ref 0.61–1.24)
Glucose, Bld: 148 mg/dL — ABNORMAL HIGH (ref 70–99)
HCT: 49 % (ref 39.0–52.0)
Hemoglobin: 16.7 g/dL (ref 13.0–17.0)
Potassium: 3.7 mmol/L (ref 3.5–5.1)
Sodium: 138 mmol/L (ref 135–145)
TCO2: 27 mmol/L (ref 22–32)

## 2020-12-22 MED ORDER — TAMSULOSIN HCL 0.4 MG PO CAPS
0.4000 mg | ORAL_CAPSULE | ORAL | Status: AC
  Administered 2020-12-22: 0.4 mg via ORAL
  Filled 2020-12-22: qty 1

## 2020-12-22 MED ORDER — SULFAMETHOXAZOLE-TRIMETHOPRIM 800-160 MG PO TABS
1.0000 | ORAL_TABLET | Freq: Once | ORAL | Status: AC
  Administered 2020-12-22: 1 via ORAL
  Filled 2020-12-22: qty 1

## 2020-12-22 MED ORDER — LIDOCAINE HCL URETHRAL/MUCOSAL 2 % EX GEL
1.0000 "application " | Freq: Once | CUTANEOUS | Status: AC
  Administered 2020-12-22: 1 via URETHRAL
  Filled 2020-12-22: qty 11

## 2020-12-22 MED ORDER — SULFAMETHOXAZOLE-TRIMETHOPRIM 800-160 MG PO TABS: 1.0000 | ORAL_TABLET | Freq: Two times a day (BID) | ORAL | 0 refills | Status: AC

## 2020-12-22 MED ORDER — ACETAMINOPHEN 500 MG PO TABS
1000.0000 mg | ORAL_TABLET | Freq: Once | ORAL | Status: AC
  Administered 2020-12-22: 1000 mg via ORAL
  Filled 2020-12-22: qty 2

## 2020-12-22 MED ORDER — TAMSULOSIN HCL 0.4 MG PO CAPS
0.4000 mg | ORAL_CAPSULE | Freq: Every day | ORAL | 0 refills | Status: DC
Start: 2020-12-22 — End: 2022-02-28

## 2020-12-22 NOTE — ED Triage Notes (Signed)
Pt reports urinary retention since 8pm last night. Hx of prostate issues. Pt distended and suprapubic area painful to touch.

## 2020-12-22 NOTE — ED Provider Notes (Addendum)
[facility]-EMERGENCY DEPT Provider Note   CSN: [id number] Arrival date & time: 12/22/20  0557     History Chief Complaint  Patient presents with  . Urinary Retention    Kenneth Woods is a 87 y.o. male.  The history is provided by the patient.  Illness Location:  Bladder Quality:  Inability to void  Severity:  Severe Onset quality:  Gradual Duration:  10 hours Timing:  Constant Progression:  Unchanged Chronicity:  New Context:  H/o BPH Relieved by:  Nothing Worsened by:  Time Ineffective treatments:  None tried  Associated symptoms: no chest pain, no congestion, no cough, no diarrhea, no ear pain, no fatigue, no fever, no headaches, no loss of consciousness, no myalgias, no nausea, no rash, no rhinorrhea, no shortness of breath, no sore throat, no vomiting and no wheezing   Associated symptoms comment:  Abdominal distention  Risk factors:  Elderly with BPH      Past Medical History:  Diagnosis Date  . Arthritis   . Back pain, chronic   . BPH (benign prostatic hypertrophy) with urinary obstruction   . COPD (chronic obstructive pulmonary disease) (HCC)   . Elevated PSA 10/16/2010  . H/O prostate biopsy   . Hemorrhoids, external   . Hyperlipidemia   . Hypertension     Patient Active Problem List   Diagnosis Date Noted  . COPD exacerbation (HCC) 12/13/2015  . Elevated troponin 12/13/2015  . Hypokalemia 12/13/2015  . Retention of urine   . Hyperlipidemia   . Hypertension   . COPD (chronic obstructive pulmonary disease) (HCC)   . Arthritis   . BPH with elevated PSA   . Elevated PSA 10/16/2010    Past Surgical History:  Procedure Laterality Date  . FRACTURE SURGERY  1959   Work accident-caused FX whole body expect rt arm,was in body cast- was working on roof- hosp for 5 mos.  . INGUINAL HERNIA REPAIR Bilateral 1983  . PROSTATE BIOPSY N/A 06/20/2013   Procedure: PROSTATE BIOPSY;  Surgeon: Garnett Farm, MD;  Location: [facility];  Service: Urology;  Laterality: N/A;  1 hour req for this case  ULTRASOUND EQUIPHMENT TO BE PROVIDED BY [facility]       Family History  Problem Relation Age of Onset  . CVA Mother   . Cancer Father        brain tumor    Social History   Tobacco Use  . Smoking status: Former Smoker    Packs/day: 1.00    Years: 30.00    Pack years: 30.00    Types: Cigarettes    Quit date: 06/09/2005    Years since quitting: 15.5  . Smokeless tobacco: Never Used  Substance Use Topics  . Alcohol use: Yes    Comment: OCCASIONAL  . Drug use: No    Home Medications Prior to Admission medications   Medication Sig Start Date End Date Taking? Authorizing Provider  tamsulosin (FLOMAX) 0.4 MG CAPS capsule Take 1 capsule (0.4 mg total) by mouth daily. 12/22/20  Yes Linde Wilensky, MD  albuterol (PROVENTIL HFA;VENTOLIN HFA) 108 (90 Base) MCG/ACT inhaler Inhale 2 puffs into the lungs every 6 (six) hours as needed for wheezing or shortness of breath. 12/13/15   Dhungel, Nishant, MD  amLODipine (NORVASC) 5 MG tablet TAKE 1 TABLET BY MOUTH EVERY DAY 11/15/15   Allayne Butcher B, PA-C  finasteride (PROSCAR) 5 MG tablet TAKE 1 TABLET (5 MG TOTAL) BY MOUTH DAILY. 11/15/15  Dixon, Mary B, PA-C  guaiFENesin-dextromethorphan (ROBITUSSIN DM) 100-10 MG/5ML syrup Take 5 mLs by mouth every 4 (four) hours as needed for cough. 12/13/15   Dhungel, Nishant, MD  lisinopril (PRINIVIL,ZESTRIL) 20 MG tablet TAKE 1 TABLET BY MOUTH EVERY DAY 09/29/15   Allayne Butcherixon, Mary B, PA-C  OVER THE COUNTER MEDICATION Take 2 capsules by mouth 2 (two) times daily. PROSTA-STRONG (saw palmetto,lycopene,pumpkin seed extract)    [provider]  tamsulosin (FLOMAX) 0.4 MG CAPS capsule TAKE ONE CAPSULE BY MOUTH EVERY DAY AFTER SUPPER 06/18/15   Dorena Bodoixon, Mary B, PA-C  tamsulosin (FLOMAX) 0.4 MG CAPS capsule Take 1 capsule (0.4 mg total) by mouth daily. 08/28/20   Ginamarie Banfield, MD    Allergies    Penicillins  Review of Systems    Review of Systems  Constitutional: Negative for fatigue and fever.  HENT: Negative for congestion, ear pain, rhinorrhea and sore throat.   Eyes: Negative for visual disturbance.  Respiratory: Negative for cough, shortness of breath and wheezing.   Cardiovascular: Negative for chest pain.  Gastrointestinal: Negative for diarrhea, nausea and vomiting.  Genitourinary: Positive for difficulty urinating.  Musculoskeletal: Negative for myalgias.  Skin: Negative for rash.  Neurological: Negative for loss of consciousness and headaches.  All other systems reviewed and are negative.   Physical Exam Updated Vital Signs BP (!) 186/73 (BP Location: Right Arm)   Pulse 70   Temp 97.7 F (36.5 C) (Oral)   Resp 16   SpO2 95%   Physical Exam Vitals and nursing note reviewed.  Constitutional:      General: He is not in acute distress.    Appearance: Normal appearance.  HENT:     Head: Normocephalic and atraumatic.     Nose: Nose normal.  Eyes:     Conjunctiva/sclera: Conjunctivae normal.     Pupils: Pupils are equal, round, and reactive to light.  Cardiovascular:     Rate and Rhythm: Normal rate and regular rhythm.     Pulses: Normal pulses.     Heart sounds: Normal heart sounds.  Pulmonary:     Effort: Pulmonary effort is normal.     Breath sounds: Normal breath sounds.  Abdominal:     General: Abdomen is flat.     Palpations: Abdomen is soft.     Tenderness: There is no abdominal tenderness. There is no guarding or rebound.  Musculoskeletal:        General: Normal range of motion.     Cervical back: Normal range of motion and neck supple.  Skin:    General: Skin is warm and dry.     Capillary Refill: Capillary refill takes less than 2 seconds.  Neurological:     General: No focal deficit present.     Mental Status: He is alert and oriented to person, place, and time.     Deep Tendon Reflexes: Reflexes normal.  Psychiatric:        Mood and Affect: Mood normal.         Behavior: Behavior normal.     ED Results / Procedures / Treatments   Labs (all labs ordered are listed, but only abnormal results are displayed) Results for orders placed or performed during the hospital encounter of 12/22/20  CBC with Differential/Platelet  Result Value Ref Range   WBC 7.5 4.0 - 10.5 K/uL   RBC 5.29 4.22 - 5.81 MIL/uL   Hemoglobin 15.6 13.0 - 17.0 g/dL   HCT 16.147.8 09.639.0 - 04.552.0 %   MCV 90.4  80.0 - 100.0 fL   MCH 29.5 26.0 - 34.0 pg   MCHC 32.6 30.0 - 36.0 g/dL   RDW 51.0 25.8 - 52.7 %   Platelets 219 150 - 400 K/uL   nRBC 0.0 0.0 - 0.2 %   Neutrophils Relative % 90 %   Neutro Abs 6.7 1.7 - 7.7 K/uL   Lymphocytes Relative 6 %   Lymphs Abs 0.5 (L) 0.7 - 4.0 K/uL   Monocytes Relative 4 %   Monocytes Absolute 0.3 0.1 - 1.0 K/uL   Eosinophils Relative 0 %   Eosinophils Absolute 0.0 0.0 - 0.5 K/uL   Basophils Relative 0 %   Basophils Absolute 0.0 0.0 - 0.1 K/uL   Immature Granulocytes 0 %   Abs Immature Granulocytes 0.03 0.00 - 0.07 K/uL  Urinalysis, Routine w reflex microscopic Urine, Catheterized  Result Value Ref Range   Color, Urine YELLOW YELLOW   APPearance HAZY (A) CLEAR   Specific Gravity, Urine 1.005 1.005 - 1.030   pH 7.0 5.0 - 8.0   Glucose, UA NEGATIVE NEGATIVE mg/dL   Hgb urine dipstick MODERATE (A) NEGATIVE   Bilirubin Urine NEGATIVE NEGATIVE   Ketones, ur NEGATIVE NEGATIVE mg/dL   Protein, ur NEGATIVE NEGATIVE mg/dL   Nitrite POSITIVE (A) NEGATIVE   Leukocytes,Ua LARGE (A) NEGATIVE   RBC / HPF 0-5 0 - 5 RBC/hpf   WBC, UA >50 (H) 0 - 5 WBC/hpf   Bacteria, UA RARE (A) NONE SEEN   Squamous Epithelial / LPF 0-5 0 - 5   WBC Clumps PRESENT   I-stat chem 8, ED (not at [facility] or [facility])  Result Value Ref Range   Sodium 138 135 - 145 mmol/L   Potassium 3.7 3.5 - 5.1 mmol/L   Chloride 100 98 - 111 mmol/L   BUN 14 8 - 23 mg/dL   Creatinine, Ser 7.82 0.61 - 1.24 mg/dL   Glucose, Bld 423 (H) 70 - 99 mg/dL   Calcium, Ion 5.36 1.44 - 1.40 mmol/L   TCO2  27 22 - 32 mmol/L   Hemoglobin 16.7 13.0 - 17.0 g/dL   HCT 31.5 40.0 - 86.7 %   No results found.  Radiology No results found.  Procedures Procedures   Medications Ordered in ED Medications  lidocaine (XYLOCAINE) 2 % jelly 1 application (has no administration in time range)  tamsulosin (FLOMAX) capsule 0.4 mg (has no administration in time range)  acetaminophen (TYLENOL) tablet 1,000 mg (has no administration in time range)    ED Course  I have reviewed the triage vital signs and the nursing notes.  Pertinent labs & imaging results that were available during my care of the patient were reviewed by me and considered in my medical decision making (see chart for details).   798 on bladder scan, foley ordered in the ED.      Kenneth Woods was evaluated in Emergency Department on 12/22/2020 for the symptoms described in the history of present illness. He was evaluated in the context of the global COVID-19 pandemic, which necessitated consideration that the patient might be at risk for infection with the SARS-CoV-2 virus that causes COVID-19. Institutional protocols and algorithms that pertain to the evaluation of patients at risk for COVID-19 are in a state of rapid change based on information released by regulatory bodies including the CDC and federal and state organizations. These policies and algorithms were followed during the patient's care in the ED.  Final Clinical Impression(s) / ED Diagnoses Final diagnoses:  Urinary retention   Return for intractable cough, coughing up blood, fevers >100.4 unrelieved by medication, shortness of breath, intractable vomiting, chest pain, shortness of breath, weakness, numbness, changes in speech, facial asymmetry, abdominal pain, passing out, Inability to tolerate liquids or food, cough, altered mental status or any concerns. No signs of systemic illness or infection. The patient is nontoxic-appearing on exam and vital signs are within normal  limits.  I have reviewed the triage vital signs and the nursing notes. Pertinent labs & imaging results that were available during my care of the patient were reviewed by me and considered in my medical decision making (see chart for details). After history, exam, and medical workup I feel the patient has been appropriately medically screened and is safe for discharge home. Pertinent diagnoses were discussed with the patient. Patient was given return precautions.     Rx / DC Orders ED Discharge Orders         Ordered    tamsulosin (FLOMAX) 0.4 MG CAPS capsule  Daily        12/22/20 0624           Zebedee Segundo, MD 12/22/20 [id number]    Nicanor Alcon, Zenaida Tesar, MD 12/22/20 1423

## 2020-12-28 ENCOUNTER — Emergency Department (HOSPITAL_COMMUNITY)
Admission: EM | Admit: 2020-12-28 | Discharge: 2020-12-28 | Disposition: A | Discharge status: Home / Self Care | Payer: Medicare Other | Attending: Emergency Medicine | Admitting: Emergency Medicine

## 2020-12-28 ENCOUNTER — Encounter (HOSPITAL_COMMUNITY): Payer: Self-pay | Admitting: Emergency Medicine

## 2020-12-28 DIAGNOSIS — Y846 Urinary catheterization as the cause of abnormal reaction of the patient, or of later complication, without mention of misadventure at the time of the procedure: Secondary | ICD-10-CM | POA: Insufficient documentation

## 2020-12-28 DIAGNOSIS — R310 Gross hematuria: Secondary | ICD-10-CM

## 2020-12-28 DIAGNOSIS — I10 Essential (primary) hypertension: Secondary | ICD-10-CM | POA: Insufficient documentation

## 2020-12-28 DIAGNOSIS — T83091A Other mechanical complication of indwelling urethral catheter, initial encounter: Secondary | ICD-10-CM | POA: Diagnosis not present

## 2020-12-28 DIAGNOSIS — Z87891 Personal history of nicotine dependence: Secondary | ICD-10-CM | POA: Diagnosis not present

## 2020-12-28 DIAGNOSIS — J449 Chronic obstructive pulmonary disease, unspecified: Secondary | ICD-10-CM | POA: Insufficient documentation

## 2020-12-28 DIAGNOSIS — R319 Hematuria, unspecified: Secondary | ICD-10-CM | POA: Diagnosis present

## 2020-12-28 DIAGNOSIS — Z79899 Other long term (current) drug therapy: Secondary | ICD-10-CM | POA: Insufficient documentation

## 2020-12-28 LAB — URINALYSIS, ROUTINE W REFLEX MICROSCOPIC
Bilirubin Urine: NEGATIVE
Glucose, UA: NEGATIVE mg/dL
Ketones, ur: NEGATIVE mg/dL
Leukocytes,Ua: NEGATIVE
Nitrite: NEGATIVE
Protein, ur: 100 mg/dL — AB
RBC / HPF: >50 RBC/hpf — ABNORMAL HIGH (ref 0–5)
Specific Gravity, Urine: 1.013 (ref 1.005–1.030)
pH: 7 (ref 5.0–8.0)

## 2020-12-28 NOTE — ED Notes (Signed)
Pt reports pain around penis. Urinary catheter present with red urine in bag. Pt reports he was supposed to have an appointment this week with urology but the pain got bad. Does not remember when the urine became red. States "I dont know what happened". Awaiting MD eval. Stretcher low, wheels locked. Call bell in reach.

## 2020-12-28 NOTE — ED Triage Notes (Signed)
Per pt, states he is having penis pain and some bleeding around catheter site-catheter placed on 3/9-has not followed up with urology

## 2020-12-28 NOTE — ED Notes (Signed)
Leg bag placed on patient and patient taught how to drain by this RN. Discharge instructions reviewed and patient knows to follow up with urologist.

## 2020-12-28 NOTE — ED Notes (Signed)
Directions given to patients son Onalee Hua) on how to care for and drain catheter. Son made aware to call urologist circled on discharge papers to make an appointment for follow up.

## 2020-12-29 LAB — URINE CULTURE: Culture: NO GROWTH

## 2020-12-30 DIAGNOSIS — R338 Other retention of urine: Secondary | ICD-10-CM | POA: Diagnosis not present

## 2020-12-30 NOTE — ED Provider Notes (Signed)
[facility]-EMERGENCY DEPT Provider Note   CSN: [id number] Arrival date & time: 12/28/20  0725     History Chief Complaint  Patient presents with  . Penis Pain    Kenneth Woods is a 87 y.o. male.  HPI     87yo male with history of urinary retention with recent visit 3/9 for placement presents with concern for hematuria and lower abdominal pain.  Reports pressure and burning to lower abdomen.  Blood noted in bag, he has not emptied it since last night. No back pain, fever, nausea, vomiting, penile pain. No lightheadedness.   Past Medical History:  Diagnosis Date  . Arthritis   . Back pain, chronic   . BPH (benign prostatic hypertrophy) with urinary obstruction   . COPD (chronic obstructive pulmonary disease) (HCC)   . Elevated PSA 10/16/2010  . H/O prostate biopsy   . Hemorrhoids, external   . Hyperlipidemia   . Hypertension     Patient Active Problem List   Diagnosis Date Noted  . COPD exacerbation (HCC) 12/13/2015  . Elevated troponin 12/13/2015  . Hypokalemia 12/13/2015  . Retention of urine   . Hyperlipidemia   . Hypertension   . COPD (chronic obstructive pulmonary disease) (HCC)   . Arthritis   . BPH with elevated PSA   . Elevated PSA 10/16/2010    Past Surgical History:  Procedure Laterality Date  . FRACTURE SURGERY  1959   Work accident-caused FX whole body expect rt arm,was in body cast- was working on roof- hosp for 5 mos.  . INGUINAL HERNIA REPAIR Bilateral 1983  . PROSTATE BIOPSY N/A 06/20/2013   Procedure: PROSTATE BIOPSY;  Surgeon: Garnett Farm, MD;  Location: [facility];  Service: Urology;  Laterality: N/A;  1 hour req for this case  ULTRASOUND EQUIPHMENT TO BE PROVIDED BY [facility]       Family History  Problem Relation Age of Onset  . CVA Mother   . Cancer Father        brain tumor    Social History   Tobacco Use  . Smoking status: Former Smoker    Packs/day: 1.00    Years: 30.00     Pack years: 30.00    Types: Cigarettes    Quit date: 06/09/2005    Years since quitting: 15.5  . Smokeless tobacco: Never Used  Substance Use Topics  . Alcohol use: Yes    Comment: OCCASIONAL  . Drug use: No    Home Medications Prior to Admission medications   Medication Sig Start Date End Date Taking? Authorizing Provider  albuterol (PROVENTIL HFA;VENTOLIN HFA) 108 (90 Base) MCG/ACT inhaler Inhale 2 puffs into the lungs every 6 (six) hours as needed for wheezing or shortness of breath. 12/13/15   Dhungel, Nishant, MD  amLODipine (NORVASC) 5 MG tablet TAKE 1 TABLET BY MOUTH EVERY DAY 11/15/15   Allayne Butcher B, PA-C  finasteride (PROSCAR) 5 MG tablet TAKE 1 TABLET (5 MG TOTAL) BY MOUTH DAILY. 11/15/15   Dixon, Mary B, PA-C  guaiFENesin-dextromethorphan (ROBITUSSIN DM) 100-10 MG/5ML syrup Take 5 mLs by mouth every 4 (four) hours as needed for cough. 12/13/15   Dhungel, Nishant, MD  lisinopril (PRINIVIL,ZESTRIL) 20 MG tablet TAKE 1 TABLET BY MOUTH EVERY DAY 09/29/15   Allayne Butcher B, PA-C  OVER THE COUNTER MEDICATION Take 2 capsules by mouth 2 (two) times daily. PROSTA-STRONG (saw palmetto,lycopene,pumpkin seed extract)    [provider]  tamsulosin (FLOMAX) 0.4 MG CAPS capsule  TAKE ONE CAPSULE BY MOUTH EVERY DAY AFTER SUPPER 06/18/15   Dorena Bodo, PA-C  tamsulosin (FLOMAX) 0.4 MG CAPS capsule Take 1 capsule (0.4 mg total) by mouth daily. 08/28/20   Palumbo, April, MD  tamsulosin (FLOMAX) 0.4 MG CAPS capsule Take 1 capsule (0.4 mg total) by mouth daily. 12/22/20   Palumbo, April, MD    Allergies    Penicillins  Review of Systems   Review of Systems  Constitutional: Negative for fever.  Respiratory: Negative for shortness of breath.   Gastrointestinal: Positive for abdominal pain. Negative for constipation, diarrhea, nausea and vomiting.  Genitourinary: Positive for difficulty urinating and hematuria. Negative for flank pain and penile pain.  Musculoskeletal: Negative for back pain.   Neurological: Negative for light-headedness.    Physical Exam Updated Vital Signs BP 131/68   Pulse (!) 56   Temp 97.7 F (36.5 C) (Oral)   Resp 16   SpO2 97%   Physical Exam Vitals and nursing note reviewed.  Constitutional:      General: He is not in acute distress.    Appearance: Normal appearance. He is not ill-appearing, toxic-appearing or diaphoretic.  HENT:     Head: Normocephalic.  Eyes:     Conjunctiva/sclera: Conjunctivae normal.  Cardiovascular:     Rate and Rhythm: Normal rate and regular rhythm.     Pulses: Normal pulses.  Pulmonary:     Effort: Pulmonary effort is normal. No respiratory distress.  Abdominal:     Tenderness: There is abdominal tenderness (suprapubic prior to catheter change and reeval without tenderness).  Musculoskeletal:        General: No deformity or signs of injury.     Cervical back: No rigidity.  Skin:    General: Skin is warm and dry.     Coloration: Skin is not jaundiced or pale.  Neurological:     General: No focal deficit present.     Mental Status: He is alert and oriented to person, place, and time.     ED Results / Procedures / Treatments   Labs (all labs ordered are listed, but only abnormal results are displayed) Labs Reviewed  URINALYSIS, ROUTINE W REFLEX MICROSCOPIC - Abnormal; Notable for the following components:      Result Value   Hgb urine dipstick LARGE (*)    Protein, ur 100 (*)    RBC / HPF >50 (*)    Bacteria, UA RARE (*)    All other components within normal limits  URINE CULTURE    EKG None  Radiology No results found.  Procedures Procedures   Medications Ordered in ED Medications - No data to display  ED Course  I have reviewed the triage vital signs and the nursing notes.  Pertinent labs & imaging results that were available during my care of the patient were reviewed by me and considered in my medical decision making (see chart for details).    MDM Rules/Calculators/A&P                           87yo male with history of urinary retention with rcent visit 3/9 for placement presents with concern for hematuria and lower abdominal pain.  Hematuria with urinary retention due to obstructed foley.  Catheter replaced with resolution of pain, urine without continuing gross hematuria after replacement. Do not feel labwork will change course of care at this time.  UA shows hematuria without signs of infection. Recommend follow up with urology.  Final Clinical Impression(s) / ED Diagnoses Final diagnoses:  Gross hematuria  Obstruction of Foley catheter, initial encounter [facility])    Rx / DC Orders ED Discharge Orders    None       Alvira Monday, MD 12/30/20 [phone]

## 2021-01-08 ENCOUNTER — Other Ambulatory Visit: Payer: Self-pay

## 2021-01-08 ENCOUNTER — Encounter (HOSPITAL_COMMUNITY): Payer: Self-pay | Admitting: Emergency Medicine

## 2021-01-08 ENCOUNTER — Emergency Department (HOSPITAL_COMMUNITY)
Admission: EM | Admit: 2021-01-08 | Discharge: 2021-01-08 | Disposition: A | Discharge status: Home / Self Care | Payer: Medicare Other | Attending: Emergency Medicine | Admitting: Emergency Medicine

## 2021-01-08 DIAGNOSIS — R339 Retention of urine, unspecified: Secondary | ICD-10-CM | POA: Diagnosis not present

## 2021-01-08 DIAGNOSIS — Z87891 Personal history of nicotine dependence: Secondary | ICD-10-CM | POA: Diagnosis not present

## 2021-01-08 DIAGNOSIS — I10 Essential (primary) hypertension: Secondary | ICD-10-CM | POA: Insufficient documentation

## 2021-01-08 DIAGNOSIS — Z79899 Other long term (current) drug therapy: Secondary | ICD-10-CM | POA: Insufficient documentation

## 2021-01-08 DIAGNOSIS — R300 Dysuria: Secondary | ICD-10-CM | POA: Diagnosis present

## 2021-01-08 DIAGNOSIS — N390 Urinary tract infection, site not specified: Secondary | ICD-10-CM | POA: Insufficient documentation

## 2021-01-08 DIAGNOSIS — J449 Chronic obstructive pulmonary disease, unspecified: Secondary | ICD-10-CM | POA: Diagnosis not present

## 2021-01-08 LAB — URINALYSIS, ROUTINE W REFLEX MICROSCOPIC
Bilirubin Urine: NEGATIVE
Glucose, UA: NEGATIVE mg/dL
Ketones, ur: NEGATIVE mg/dL
Nitrite: NEGATIVE
Protein, ur: 30 mg/dL — AB
RBC / HPF: >50 RBC/hpf — ABNORMAL HIGH (ref 0–5)
Specific Gravity, Urine: 1.015 (ref 1.005–1.030)
WBC, UA: >50 WBC/hpf — ABNORMAL HIGH (ref 0–5)
pH: 6 (ref 5.0–8.0)

## 2021-01-08 MED ORDER — NITROFURANTOIN MONOHYD MACRO 100 MG PO CAPS
100.0000 mg | ORAL_CAPSULE | Freq: Once | ORAL | Status: AC
  Administered 2021-01-08: 100 mg via ORAL
  Filled 2021-01-08: qty 1

## 2021-01-08 MED ORDER — NITROFURANTOIN MONOHYD MACRO 100 MG PO CAPS: 100.0000 mg | ORAL_CAPSULE | Freq: Two times a day (BID) | ORAL | 0 refills | Status: DC

## 2021-01-08 NOTE — ED Provider Notes (Signed)
[facility]-EMERGENCY DEPT Provider Note   CSN: [id number] Arrival date & time: 01/08/21  1938     History Chief Complaint  Patient presents with  . Dysuria    Kenneth Woods is a 87 y.o. male.  HPI 87 year old male presents with penile burning. Has been intermittent for about 5 days. He has a foley catheter in that has been there for about 2 weeks. He's worried it's going to cause a problem by being in so long. Urine output has not changed, and urine color hasn't either. No abdominal/back pain or fevers. He wants the foley catheter out.   Past Medical History:  Diagnosis Date  . Arthritis   . Back pain, chronic   . BPH (benign prostatic hypertrophy) with urinary obstruction   . COPD (chronic obstructive pulmonary disease) (HCC)   . Elevated PSA 10/16/2010  . H/O prostate biopsy   . Hemorrhoids, external   . Hyperlipidemia   . Hypertension     Patient Active Problem List   Diagnosis Date Noted  . COPD exacerbation (HCC) 12/13/2015  . Elevated troponin 12/13/2015  . Hypokalemia 12/13/2015  . Retention of urine   . Hyperlipidemia   . Hypertension   . COPD (chronic obstructive pulmonary disease) (HCC)   . Arthritis   . BPH with elevated PSA   . Elevated PSA 10/16/2010    Past Surgical History:  Procedure Laterality Date  . FRACTURE SURGERY  1959   Work accident-caused FX whole body expect rt arm,was in body cast- was working on roof- hosp for 5 mos.  . INGUINAL HERNIA REPAIR Bilateral 1983  . PROSTATE BIOPSY N/A 06/20/2013   Procedure: PROSTATE BIOPSY;  Surgeon: Garnett Farm, MD;  Location: [facility];  Service: Urology;  Laterality: N/A;  1 hour req for this case  ULTRASOUND EQUIPHMENT TO BE PROVIDED BY [facility]       Family History  Problem Relation Age of Onset  . CVA Mother   . Cancer Father        brain tumor    Social History   Tobacco Use  . Smoking status: Former Smoker    Packs/day: 1.00     Years: 30.00    Pack years: 30.00    Types: Cigarettes    Quit date: 06/09/2005    Years since quitting: 15.5  . Smokeless tobacco: Never Used  Substance Use Topics  . Alcohol use: Yes    Comment: OCCASIONAL  . Drug use: No    Home Medications Prior to Admission medications   Medication Sig Start Date End Date Taking? Authorizing Provider  nitrofurantoin, macrocrystal-monohydrate, (MACROBID) 100 MG capsule Take 1 capsule (100 mg total) by mouth 2 (two) times daily. 01/08/21  Yes Pricilla Loveless, MD  albuterol (PROVENTIL HFA;VENTOLIN HFA) 108 (90 Base) MCG/ACT inhaler Inhale 2 puffs into the lungs every 6 (six) hours as needed for wheezing or shortness of breath. 12/13/15   Dhungel, Nishant, MD  amLODipine (NORVASC) 5 MG tablet TAKE 1 TABLET BY MOUTH EVERY DAY 11/15/15   Allayne Butcher B, PA-C  finasteride (PROSCAR) 5 MG tablet TAKE 1 TABLET (5 MG TOTAL) BY MOUTH DAILY. 11/15/15   Dixon, Mary B, PA-C  guaiFENesin-dextromethorphan (ROBITUSSIN DM) 100-10 MG/5ML syrup Take 5 mLs by mouth every 4 (four) hours as needed for cough. 12/13/15   Dhungel, Theda Belfast, MD  lisinopril (PRINIVIL,ZESTRIL) 20 MG tablet TAKE 1 TABLET BY MOUTH EVERY DAY 09/29/15   Dorena Bodo, PA-C  OVER  THE COUNTER MEDICATION Take 2 capsules by mouth 2 (two) times daily. PROSTA-STRONG (saw palmetto,lycopene,pumpkin seed extract)    [provider]  tamsulosin (FLOMAX) 0.4 MG CAPS capsule TAKE ONE CAPSULE BY MOUTH EVERY DAY AFTER SUPPER 06/18/15   Dorena Bodo, PA-C  tamsulosin (FLOMAX) 0.4 MG CAPS capsule Take 1 capsule (0.4 mg total) by mouth daily. 08/28/20   Palumbo, April, MD  tamsulosin (FLOMAX) 0.4 MG CAPS capsule Take 1 capsule (0.4 mg total) by mouth daily. 12/22/20   Palumbo, April, MD    Allergies    Penicillins  Review of Systems   Review of Systems  Constitutional: Negative for fever.  Gastrointestinal: Negative for abdominal pain.  Genitourinary: Positive for dysuria and penile pain. Negative for decreased  urine volume and hematuria.  Musculoskeletal: Negative for back pain.  All other systems reviewed and are negative.   Physical Exam Updated Vital Signs BP (!) 144/83 (BP Location: Right Arm)   Pulse (!) 58   Temp 98.1 F (36.7 C) (Oral)   Resp 16   Ht 5\' 9"  (1.753 m)   Wt 63.5 kg   SpO2 100%   BMI 20.67 kg/m   Physical Exam Vitals and nursing note reviewed.  Constitutional:      Appearance: He is well-developed.  HENT:     Head: Normocephalic and atraumatic.     Right Ear: External ear normal.     Left Ear: External ear normal.     Nose: Nose normal.  Eyes:     General:        Right eye: No discharge.        Left eye: No discharge.  Cardiovascular:     Rate and Rhythm: Normal rate and regular rhythm.     Heart sounds: Normal heart sounds.  Pulmonary:     Effort: Pulmonary effort is normal.     Breath sounds: Normal breath sounds.  Abdominal:     General: There is no distension.     Palpations: Abdomen is soft.     Tenderness: There is no abdominal tenderness.  Genitourinary:    Comments: Foley catheter in place. Mild penile tenderness. No obvious injuries, swelling, discharge Musculoskeletal:     Cervical back: Neck supple.  Skin:    General: Skin is warm and dry.  Neurological:     Mental Status: He is alert.  Psychiatric:        Mood and Affect: Mood is not anxious.     ED Results / Procedures / Treatments   Labs (all labs ordered are listed, but only abnormal results are displayed) Labs Reviewed  URINALYSIS, ROUTINE W REFLEX MICROSCOPIC - Abnormal; Notable for the following components:      Result Value   APPearance CLOUDY (*)    Hgb urine dipstick LARGE (*)    Protein, ur 30 (*)    Leukocytes,Ua LARGE (*)    RBC / HPF >50 (*)    WBC, UA >50 (*)    Bacteria, UA RARE (*)    All other components within normal limits  URINE CULTURE    EKG None  Radiology No results found.  Procedures Procedures   Medications Ordered in ED Medications   nitrofurantoin (macrocrystal-monohydrate) (MACROBID) capsule 100 mg (100 mg Oral Given 01/08/21 2208)    ED Course  I have reviewed the triage vital signs and the nursing notes.  Pertinent labs & imaging results that were available during my care of the patient were reviewed by me and considered in  my medical decision making (see chart for details).    MDM Rules/Calculators/A&P                          Patient's vital signs are unremarkable save for some mild hypertension and bradycardia. No complaints except for penile pain associated with the catheter. He is asking for it to come out. Felt relief when it did. Was able to urinate a little. Was observed and voiding trial again occurred. Unfortunately he was able to get out some urine but despite this had 300+ cc remaining in his bladder. Thus a new foley was placed. Urine with significant leukocytes, so will send for culture and give short course of antibiotics. Advised to keep follow up with urology in 2 days.  Final Clinical Impression(s) / ED Diagnoses Final diagnoses:  Acute urinary tract infection  Urinary retention    Rx / DC Orders ED Discharge Orders         Ordered    nitrofurantoin, macrocrystal-monohydrate, (MACROBID) 100 MG capsule  2 times daily        01/08/21 2242           Pricilla Loveless, MD 01/08/21 2249

## 2021-01-08 NOTE — ED Notes (Addendum)
Bladder scan volume: 300 mL. The ultrasound was performed after the patient attempted to urinate laying down and then also standing up. Pt had 275 mL of urine output on his own.

## 2021-01-08 NOTE — ED Triage Notes (Addendum)
Pt reports burning in his penis. States it "burns like fire." He has a foley in place. States that foley is draining. He has not been able to follow up with urology yet.

## 2021-01-11 DIAGNOSIS — R338 Other retention of urine: Secondary | ICD-10-CM | POA: Diagnosis not present

## 2021-01-11 LAB — URINE CULTURE: Culture: 30000 — AB

## 2021-01-12 ENCOUNTER — Telehealth: Payer: Self-pay | Admitting: Emergency Medicine

## 2021-01-12 NOTE — Telephone Encounter (Signed)
Post ED Visit - Positive Culture Follow-up  Culture report reviewed by antimicrobial stewardship pharmacist: [facility] [x]  , Pharm.D. []  Enzo Bi, Pharm.D., BCPS AQ-ID []  , Pharm.D., BCPS []  Celedonio Miyamoto, Pharm.D., BCPS []  [place], Garvin Fila.D., BCPS, AAHIVP []  , Pharm.D., BCPS, AAHIVP []  Georgina Pillion, PharmD, BCPS []  , PharmD, BCPS []  [place], PharmD, BCPS []  [address], PharmD []  , PharmD, BCPS []  Estella Husk, PharmD  Pharmacy Team []  Lysle Pearl, PharmD []  , PharmD []  Phillips Climes, PharmD []  , Rph []  Agapito Games) , PharmD []  Verlan Friends, PharmD []  , PharmD []  Mervyn Gay, PharmD []  , PharmD []  Vinnie Level, PharmD []  [facility], PharmD []  , PharmD []  Len Childs, PharmD   Positive urine culture Treated with nitrofurantoin, organism sensitive to the same and no further patient follow-up is required at this time.  01/12/2021, 9:27 AM

## 2021-03-10 ENCOUNTER — Encounter (HOSPITAL_COMMUNITY): Payer: Self-pay | Admitting: *Deleted

## 2021-03-10 ENCOUNTER — Other Ambulatory Visit: Payer: Self-pay

## 2021-03-10 ENCOUNTER — Emergency Department (HOSPITAL_COMMUNITY)
Admission: EM | Admit: 2021-03-10 | Discharge: 2021-03-10 | Disposition: A | Discharge status: Home / Self Care | Payer: Medicare Other | Attending: Emergency Medicine | Admitting: Emergency Medicine

## 2021-03-10 DIAGNOSIS — Z87891 Personal history of nicotine dependence: Secondary | ICD-10-CM | POA: Insufficient documentation

## 2021-03-10 DIAGNOSIS — I10 Essential (primary) hypertension: Secondary | ICD-10-CM | POA: Diagnosis not present

## 2021-03-10 DIAGNOSIS — Z79899 Other long term (current) drug therapy: Secondary | ICD-10-CM | POA: Diagnosis not present

## 2021-03-10 DIAGNOSIS — R339 Retention of urine, unspecified: Secondary | ICD-10-CM | POA: Insufficient documentation

## 2021-03-10 DIAGNOSIS — J441 Chronic obstructive pulmonary disease with (acute) exacerbation: Secondary | ICD-10-CM | POA: Insufficient documentation

## 2021-03-10 LAB — URINALYSIS, ROUTINE W REFLEX MICROSCOPIC
Bacteria, UA: NONE SEEN
Bilirubin Urine: NEGATIVE
Glucose, UA: NEGATIVE mg/dL
Ketones, ur: NEGATIVE mg/dL
Leukocytes,Ua: NEGATIVE
Nitrite: NEGATIVE
Protein, ur: NEGATIVE mg/dL
Specific Gravity, Urine: 1.002 — ABNORMAL LOW (ref 1.005–1.030)
pH: 6 (ref 5.0–8.0)

## 2021-03-10 NOTE — ED Triage Notes (Signed)
Pt reports unable to urinate x 12 hours.

## 2021-03-10 NOTE — Discharge Instructions (Addendum)
You had a catheter placed today, you will need to schedule an appointment to follow up with urology for removal of your urine catheter.    Your urine sample did not show any signs of infection, however it was sent for culture. Follow up with your results via Mychart.

## 2021-03-10 NOTE — ED Provider Notes (Signed)
Emergency Medicine Provider Triage Evaluation Note  ALEXEI DOSWELL , a 87 y.o. male  was evaluated in triage.  Pt complains of urinary retention. States he has not been able to urinate for 12 hours. He is c/o suprapubic pain.  Review of Systems  Positive: Urinary retention Negative: Nv, fevers  Physical Exam  BP (!) 197/90   Pulse 65   Temp 98.4 F (36.9 C) (Oral)   Resp 16   SpO2 96%  Gen:   Awake, no distress   Resp:  Normal effort  MSK:   Moves extremities without difficulty  Other:  Suprapubic ttp  Medical Decision Making  Medically screening exam initiated at 5:25 PM.  Appropriate orders placed.  CHRIST FULLENWIDER was informed that the remainder of the evaluation will be completed by another provider, this initial triage assessment does not replace that evaluation, and the importance of remaining in the ED until their evaluation is complete.     Karrie Meres, PA-C 03/10/21 1728    Margarita Grizzle, MD 03/15/21 [phone]

## 2021-03-10 NOTE — ED Provider Notes (Signed)
[facility] EMERGENCY DEPARTMENT Provider Note   CSN: [id number] Arrival date & time: 03/10/21  1717     History Chief Complaint  Patient presents with  . Urinary Retention    Kenneth Woods is a 87 y.o. male.  87 y.o male with a recurrent history of urinary retention, BPH presents to the ED for urinary retention for the past 12 hours.  According to chart review, patient has had a catheter in place, family member at the bedside reports. This is a recurrent complaint for patient.  Previously followed by [facility]. No abdominal pain, no fever, no hematuria, no other complaints.   The history is provided by the patient.       Past Medical History:  Diagnosis Date  . Arthritis   . Back pain, chronic   . BPH (benign prostatic hypertrophy) with urinary obstruction   . COPD (chronic obstructive pulmonary disease) (HCC)   . Elevated PSA 10/16/2010  . H/O prostate biopsy   . Hemorrhoids, external   . Hyperlipidemia   . Hypertension     Patient Active Problem List   Diagnosis Date Noted  . COPD exacerbation (HCC) 12/13/2015  . Elevated troponin 12/13/2015  . Hypokalemia 12/13/2015  . Retention of urine   . Hyperlipidemia   . Hypertension   . COPD (chronic obstructive pulmonary disease) (HCC)   . Arthritis   . BPH with elevated PSA   . Elevated PSA 10/16/2010    Past Surgical History:  Procedure Laterality Date  . FRACTURE SURGERY  1959   Work accident-caused FX whole body expect rt arm,was in body cast- was working on roof- hosp for 5 mos.  . INGUINAL HERNIA REPAIR Bilateral 1983  . PROSTATE BIOPSY N/A 06/20/2013   Procedure: PROSTATE BIOPSY;  Surgeon: Garnett Farm, MD;  Location: [facility];  Service: Urology;  Laterality: N/A;  1 hour req for this case  ULTRASOUND EQUIPHMENT TO BE PROVIDED BY [facility]       Family History  Problem Relation Age of Onset  . CVA Mother   . Cancer Father        brain tumor     Social History   Tobacco Use  . Smoking status: Former Smoker    Packs/day: 1.00    Years: 30.00    Pack years: 30.00    Types: Cigarettes    Quit date: 06/09/2005    Years since quitting: 15.7  . Smokeless tobacco: Never Used  Substance Use Topics  . Alcohol use: Yes    Comment: OCCASIONAL  . Drug use: No    Home Medications Prior to Admission medications   Medication Sig Start Date End Date Taking? Authorizing Provider  albuterol (PROVENTIL HFA;VENTOLIN HFA) 108 (90 Base) MCG/ACT inhaler Inhale 2 puffs into the lungs every 6 (six) hours as needed for wheezing or shortness of breath. 12/13/15   Dhungel, Nishant, MD  amLODipine (NORVASC) 5 MG tablet TAKE 1 TABLET BY MOUTH EVERY DAY 11/15/15   Allayne Butcher B, PA-C  finasteride (PROSCAR) 5 MG tablet TAKE 1 TABLET (5 MG TOTAL) BY MOUTH DAILY. 11/15/15   Dixon, Mary B, PA-C  guaiFENesin-dextromethorphan (ROBITUSSIN DM) 100-10 MG/5ML syrup Take 5 mLs by mouth every 4 (four) hours as needed for cough. 12/13/15   Dhungel, Theda Belfast, MD  lisinopril (PRINIVIL,ZESTRIL) 20 MG tablet TAKE 1 TABLET BY MOUTH EVERY DAY 09/29/15   Dorena Bodo, PA-C  nitrofurantoin, macrocrystal-monohydrate, (MACROBID) 100 MG capsule Take 1 capsule (100 mg  total) by mouth 2 (two) times daily. 01/08/21   Pricilla Loveless, MD  OVER THE COUNTER MEDICATION Take 2 capsules by mouth 2 (two) times daily. PROSTA-STRONG (saw palmetto,lycopene,pumpkin seed extract)    [provider]  tamsulosin (FLOMAX) 0.4 MG CAPS capsule TAKE ONE CAPSULE BY MOUTH EVERY DAY AFTER SUPPER 06/18/15   Dorena Bodo, PA-C  tamsulosin (FLOMAX) 0.4 MG CAPS capsule Take 1 capsule (0.4 mg total) by mouth daily. 08/28/20   Palumbo, April, MD  tamsulosin (FLOMAX) 0.4 MG CAPS capsule Take 1 capsule (0.4 mg total) by mouth daily. 12/22/20   Palumbo, April, MD    Allergies    Penicillins  Review of Systems   Review of Systems  Constitutional: Negative for fever.  HENT: Negative for sore throat.    Respiratory: Negative for shortness of breath.   Cardiovascular: Negative for chest pain.  Gastrointestinal: Negative for abdominal pain, nausea and vomiting.  Genitourinary: Positive for difficulty urinating.  Musculoskeletal: Negative for back pain.  Neurological: Negative for light-headedness and headaches.  All other systems reviewed and are negative.   Physical Exam Updated Vital Signs BP (!) 174/95   Pulse (!) 56   Temp 97.9 F (36.6 C) (Oral)   Resp 19   SpO2 95%   Physical Exam Vitals and nursing note reviewed.  Constitutional:      Appearance: Normal appearance.  HENT:     Head: Normocephalic and atraumatic.     Mouth/Throat:     Mouth: Mucous membranes are moist.  Eyes:     Pupils: Pupils are equal, round, and reactive to light.  Cardiovascular:     Rate and Rhythm: Normal rate.  Pulmonary:     Effort: Pulmonary effort is normal.  Abdominal:     General: Abdomen is flat.  Musculoskeletal:     Cervical back: Normal range of motion and neck supple.  Skin:    General: Skin is warm and dry.  Neurological:     Mental Status: He is alert and oriented to person, place, and time. Mental status is at baseline.     ED Results / Procedures / Treatments   Labs (all labs ordered are listed, but only abnormal results are displayed) Labs Reviewed  URINE CULTURE  URINALYSIS, ROUTINE W REFLEX MICROSCOPIC    EKG None  Radiology No results found.  Procedures Procedures   Medications Ordered in ED Medications - No data to display  ED Course  I have reviewed the triage vital signs and the nursing notes.  Pertinent labs & imaging results that were available during my care of the patient were reviewed by me and considered in my medical decision making (see chart for details).  Clinical Course as of 03/10/21 1942  Thu Mar 10, 2021  1942 Hgb urine dipstick(!): SMALL [JS]    Clinical Course User Index [JS] Claude Manges, PA-C   MDM Rules/Calculators/A&P     Patient presents to the ED with a chief complaint of urinary retention.  According to extensive chart review, patient has had this issue multiple times.  Reports he has not voided in about 12 hours, usually according to family member at the bedside has a catheter in place.  This is a recurrent problem for patient.  Bladder scan over 1000 ML   Urinalysis along with urine culture have been ordered.  According to chart, last placed on Macrobid after his discharge on January 08, 2021.  UA with small hemoglobin no nitrites or leukocytes, sent for culture. Patient stable for  dc with Urology follow up.   Portions of this note were generated with Scientist, clinical (histocompatibility and immunogenetics). Dictation errors may occur despite best attempts at proofreading.  Final Clinical Impression(s) / ED Diagnoses Final diagnoses:  Urinary retention    Rx / DC Orders ED Discharge Orders    None       Claude Manges, Cordelia Poche 03/10/21 1943    Milagros Loll, MD 03/11/21 1517

## 2021-03-10 NOTE — ED Notes (Signed)
Patient verbalized understanding of discharge instructions. Opportunity for questions and answers.  

## 2021-03-10 NOTE — ED Notes (Signed)
Bladder scan over 

## 2021-03-10 NOTE — ED Notes (Signed)
Pt refusing temp .

## 2021-03-11 ENCOUNTER — Ambulatory Visit (HOSPITAL_COMMUNITY)
Admission: EM | Admit: 2021-03-11 | Discharge: 2021-03-11 | Disposition: A | Discharge status: Home / Self Care | Payer: Medicare Other

## 2021-03-11 ENCOUNTER — Encounter (HOSPITAL_COMMUNITY): Payer: Self-pay

## 2021-03-11 DIAGNOSIS — R338 Other retention of urine: Secondary | ICD-10-CM | POA: Diagnosis not present

## 2021-03-11 NOTE — ED Triage Notes (Signed)
Pt is here to have his catheter removed. Pt states he does not have the bag on, he only has the tube and he would like for it to be removed.

## 2021-03-13 LAB — URINE CULTURE: Culture: NO GROWTH

## 2021-03-24 DIAGNOSIS — R338 Other retention of urine: Secondary | ICD-10-CM | POA: Diagnosis not present

## 2021-04-26 DIAGNOSIS — Z87448 Personal history of other diseases of urinary system: Secondary | ICD-10-CM | POA: Diagnosis not present

## 2021-04-26 DIAGNOSIS — R339 Retention of urine, unspecified: Secondary | ICD-10-CM | POA: Diagnosis not present

## 2021-05-05 DIAGNOSIS — R338 Other retention of urine: Secondary | ICD-10-CM | POA: Diagnosis not present

## 2021-06-03 DIAGNOSIS — N3000 Acute cystitis without hematuria: Secondary | ICD-10-CM | POA: Diagnosis not present

## 2021-06-03 DIAGNOSIS — R3914 Feeling of incomplete bladder emptying: Secondary | ICD-10-CM | POA: Diagnosis not present

## 2021-07-31 ENCOUNTER — Emergency Department (HOSPITAL_COMMUNITY)
Admission: EM | Admit: 2021-07-31 | Discharge: 2021-07-31 | Disposition: A | Discharge status: Home / Self Care | Payer: Medicare Other | Attending: Emergency Medicine | Admitting: Emergency Medicine

## 2021-07-31 ENCOUNTER — Encounter (HOSPITAL_COMMUNITY): Payer: Self-pay | Admitting: Emergency Medicine

## 2021-07-31 ENCOUNTER — Other Ambulatory Visit: Payer: Self-pay

## 2021-07-31 DIAGNOSIS — J449 Chronic obstructive pulmonary disease, unspecified: Secondary | ICD-10-CM | POA: Diagnosis not present

## 2021-07-31 DIAGNOSIS — R339 Retention of urine, unspecified: Secondary | ICD-10-CM | POA: Diagnosis not present

## 2021-07-31 DIAGNOSIS — Z79899 Other long term (current) drug therapy: Secondary | ICD-10-CM | POA: Insufficient documentation

## 2021-07-31 DIAGNOSIS — Z87891 Personal history of nicotine dependence: Secondary | ICD-10-CM | POA: Diagnosis not present

## 2021-07-31 DIAGNOSIS — I10 Essential (primary) hypertension: Secondary | ICD-10-CM | POA: Diagnosis not present

## 2021-07-31 DIAGNOSIS — N401 Enlarged prostate with lower urinary tract symptoms: Secondary | ICD-10-CM | POA: Insufficient documentation

## 2021-07-31 DIAGNOSIS — R338 Other retention of urine: Secondary | ICD-10-CM

## 2021-07-31 DIAGNOSIS — E876 Hypokalemia: Secondary | ICD-10-CM | POA: Diagnosis not present

## 2021-07-31 LAB — CBC WITH DIFFERENTIAL/PLATELET
Abs Immature Granulocytes: 0.03 10*3/uL (ref 0.00–0.07)
Basophils Absolute: 0 10*3/uL (ref 0.0–0.1)
Basophils Relative: 0 %
Eosinophils Absolute: 0 10*3/uL (ref 0.0–0.5)
Eosinophils Relative: 0 %
HCT: 46.3 % (ref 39.0–52.0)
Hemoglobin: 14.9 g/dL (ref 13.0–17.0)
Immature Granulocytes: 1 %
Lymphocytes Relative: 14 %
Lymphs Abs: 0.9 10*3/uL (ref 0.7–4.0)
MCH: 28.6 pg (ref 26.0–34.0)
MCHC: 32.2 g/dL (ref 30.0–36.0)
MCV: 88.9 fL (ref 80.0–100.0)
Monocytes Absolute: 0.4 10*3/uL (ref 0.1–1.0)
Monocytes Relative: 7 %
Neutro Abs: 4.8 10*3/uL (ref 1.7–7.7)
Neutrophils Relative %: 78 %
Platelets: 248 10*3/uL (ref 150–400)
RBC: 5.21 MIL/uL (ref 4.22–5.81)
RDW: 13.6 % (ref 11.5–15.5)
WBC: 6.1 10*3/uL (ref 4.0–10.5)
nRBC: 0 % (ref 0.0–0.2)

## 2021-07-31 LAB — BASIC METABOLIC PANEL
Anion gap: 7 (ref 5–15)
BUN: 13 mg/dL (ref 8–23)
CO2: 29 mmol/L (ref 22–32)
Calcium: 9.5 mg/dL (ref 8.9–10.3)
Chloride: 102 mmol/L (ref 98–111)
Creatinine, Ser: 0.88 mg/dL (ref 0.61–1.24)
GFR, Estimated: >60 mL/min (ref >60)
Glucose, Bld: 101 mg/dL — ABNORMAL HIGH (ref 70–99)
Potassium: 4.2 mmol/L (ref 3.5–5.1)
Sodium: 138 mmol/L (ref 135–145)

## 2021-07-31 LAB — URINALYSIS, ROUTINE W REFLEX MICROSCOPIC
Bilirubin Urine: NEGATIVE
Glucose, UA: NEGATIVE mg/dL
Hgb urine dipstick: NEGATIVE
Ketones, ur: 5 mg/dL — AB
Leukocytes,Ua: NEGATIVE
Nitrite: NEGATIVE
Protein, ur: NEGATIVE mg/dL
Specific Gravity, Urine: 1.012 (ref 1.005–1.030)
pH: 7 (ref 5.0–8.0)

## 2021-07-31 NOTE — ED Notes (Signed)
MD notified of bladder scan result. Will place foley cath for urinary retention per MD verbal order.
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
## 2021-07-31 NOTE — Discharge Instructions (Signed)
You had urinary retention today with normal labs. A foley catheter was placed today, please make sure to follow-up with urology for further management and care of the catheter.
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
## 2021-07-31 NOTE — ED Notes (Signed)
Patient Alert and oriented to baseline. Stable and ambulatory to baseline. Patient verbalized understanding of the discharge instructions.  Patient belongings were taken by the patient.   

## 2021-07-31 NOTE — ED Triage Notes (Signed)
No answer x3
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
## 2021-08-04 ENCOUNTER — Encounter (HOSPITAL_COMMUNITY): Payer: Self-pay | Admitting: Emergency Medicine

## 2021-08-04 ENCOUNTER — Emergency Department (HOSPITAL_COMMUNITY)
Admission: EM | Admit: 2021-08-04 | Discharge: 2021-08-04 | Disposition: A | Discharge status: Home / Self Care | Payer: Medicare Other | Attending: Emergency Medicine | Admitting: Emergency Medicine

## 2021-08-04 ENCOUNTER — Other Ambulatory Visit: Payer: Self-pay

## 2021-08-04 DIAGNOSIS — T83091A Other mechanical complication of indwelling urethral catheter, initial encounter: Secondary | ICD-10-CM | POA: Insufficient documentation

## 2021-08-04 DIAGNOSIS — J441 Chronic obstructive pulmonary disease with (acute) exacerbation: Secondary | ICD-10-CM | POA: Insufficient documentation

## 2021-08-04 DIAGNOSIS — Z87891 Personal history of nicotine dependence: Secondary | ICD-10-CM | POA: Diagnosis not present

## 2021-08-04 DIAGNOSIS — T83098A Other mechanical complication of other indwelling urethral catheter, initial encounter: Secondary | ICD-10-CM | POA: Diagnosis not present

## 2021-08-04 DIAGNOSIS — Z79899 Other long term (current) drug therapy: Secondary | ICD-10-CM | POA: Insufficient documentation

## 2021-08-04 DIAGNOSIS — I10 Essential (primary) hypertension: Secondary | ICD-10-CM | POA: Insufficient documentation

## 2021-08-04 DIAGNOSIS — N3091 Cystitis, unspecified with hematuria: Secondary | ICD-10-CM | POA: Diagnosis not present

## 2021-08-04 DIAGNOSIS — N390 Urinary tract infection, site not specified: Secondary | ICD-10-CM

## 2021-08-04 DIAGNOSIS — T839XXA Unspecified complication of genitourinary prosthetic device, implant and graft, initial encounter: Secondary | ICD-10-CM

## 2021-08-04 LAB — URINALYSIS, ROUTINE W REFLEX MICROSCOPIC
Bilirubin Urine: NEGATIVE
Glucose, UA: NEGATIVE mg/dL
Ketones, ur: 20 mg/dL — AB
Nitrite: NEGATIVE
Protein, ur: 30 mg/dL — AB
RBC / HPF: >50 RBC/hpf — ABNORMAL HIGH (ref 0–5)
Specific Gravity, Urine: 1.021 (ref 1.005–1.030)
WBC, UA: >50 WBC/hpf — ABNORMAL HIGH (ref 0–5)
pH: 5 (ref 5.0–8.0)

## 2021-08-04 MED ORDER — SULFAMETHOXAZOLE-TRIMETHOPRIM 800-160 MG PO TABS: 1.0000 | ORAL_TABLET | Freq: Two times a day (BID) | ORAL | 0 refills | Status: AC

## 2021-08-04 NOTE — ED Notes (Signed)
30cc noted on bladder scan.

## 2021-08-04 NOTE — Discharge Instructions (Addendum)
You were seen and evaluated in the emergency department today for evaluation of Foley catheter problem.  As we discussed, we replaced your catheter.  I have also prescribed you Bactrim which is antibiotic for a urinary tract infection.  Please take the antibiotic to its entirety.  I would like for you to follow-up with your primary care doctor in 1 week.  Please return to the emergency department if you experience more problems with your Foley catheter, fevers, chills, burning when you pee, blood in your urine, or any other concerns you might have.

## 2021-08-04 NOTE — ED Triage Notes (Signed)
Pt came to ED today for evaluation of his foley catheter. Pt states it is not draining properly.

## 2021-08-04 NOTE — ED Provider Notes (Signed)
[facility] EMERGENCY DEPARTMENT Provider Note   CSN: [id number] Arrival date & time: 08/04/21  1156     History No chief complaint on file.   Kenneth Woods is a 87 y.o. male with history of BPH who presents to the emergency department with Foley catheter complaints since this morning.  Patient states that he had his Foley catheter changes morning and initially had a little bit of urine draining but has since not had any urine drained out of the bag.  He denies any fever, chills, nausea, vomiting, abdominal pain.  The history is provided by the patient. No language interpreter was used.      Past Medical History:  Diagnosis Date   Arthritis    Back pain, chronic    BPH (benign prostatic hypertrophy) with urinary obstruction    COPD (chronic obstructive pulmonary disease) (HCC)    Elevated PSA 10/16/2010   H/O prostate biopsy    Hemorrhoids, external    Hyperlipidemia    Hypertension     Patient Active Problem List   Diagnosis Date Noted   COPD exacerbation (HCC) 12/13/2015   Elevated troponin 12/13/2015   Hypokalemia 12/13/2015   Retention of urine    Hyperlipidemia    Hypertension    COPD (chronic obstructive pulmonary disease) (HCC)    Arthritis    BPH with elevated PSA    Elevated PSA 10/16/2010    Past Surgical History:  Procedure Laterality Date   FRACTURE SURGERY  1959   Work accident-caused FX whole body expect rt arm,was in body cast- was working on roof- hosp for 5 mos.   INGUINAL HERNIA REPAIR Bilateral 1983   PROSTATE BIOPSY N/A 06/20/2013   Procedure: PROSTATE BIOPSY;  Surgeon: Garnett Farm, MD;  Location: [facility];  Service: Urology;  Laterality: N/A;  1 hour req for this case  ULTRASOUND EQUIPHMENT TO BE PROVIDED BY [facility]       Family History  Problem Relation Age of Onset   CVA Mother    Cancer Father        brain tumor    Social History   Tobacco Use   Smoking status: Former     Packs/day: 1.00    Years: 30.00    Pack years: 30.00    Types: Cigarettes    Quit date: 06/09/2005    Years since quitting: 16.1   Smokeless tobacco: Never  Substance Use Topics   Alcohol use: Yes    Comment: OCCASIONAL   Drug use: No    Home Medications Prior to Admission medications   Medication Sig Start Date End Date Taking? Authorizing Provider  sulfamethoxazole-trimethoprim (BACTRIM DS) 800-160 MG tablet Take 1 tablet by mouth 2 (two) times daily for 7 days. 08/04/21 08/11/21 Yes Kelce Bouton M, PA-C  albuterol (PROVENTIL HFA;VENTOLIN HFA) 108 (90 Base) MCG/ACT inhaler Inhale 2 puffs into the lungs every 6 (six) hours as needed for wheezing or shortness of breath. 12/13/15   Dhungel, Nishant, MD  amLODipine (NORVASC) 5 MG tablet TAKE 1 TABLET BY MOUTH EVERY DAY 11/15/15   Allayne Butcher B, PA-C  finasteride (PROSCAR) 5 MG tablet TAKE 1 TABLET (5 MG TOTAL) BY MOUTH DAILY. 11/15/15   Dixon, Mary B, PA-C  guaiFENesin-dextromethorphan (ROBITUSSIN DM) 100-10 MG/5ML syrup Take 5 mLs by mouth every 4 (four) hours as needed for cough. 12/13/15   Dhungel, Theda Belfast, MD  lisinopril (PRINIVIL,ZESTRIL) 20 MG tablet TAKE 1 TABLET BY MOUTH EVERY DAY 09/29/15   Dixon,  Patriciaann Clan, PA-C  nitrofurantoin, macrocrystal-monohydrate, (MACROBID) 100 MG capsule Take 1 capsule (100 mg total) by mouth 2 (two) times daily. 01/08/21   Pricilla Loveless, MD  OVER THE COUNTER MEDICATION Take 2 capsules by mouth 2 (two) times daily. PROSTA-STRONG (saw palmetto,lycopene,pumpkin seed extract)    [provider]  tamsulosin (FLOMAX) 0.4 MG CAPS capsule TAKE ONE CAPSULE BY MOUTH EVERY DAY AFTER SUPPER 06/18/15   Dorena Bodo, PA-C  tamsulosin (FLOMAX) 0.4 MG CAPS capsule Take 1 capsule (0.4 mg total) by mouth daily. 08/28/20   Palumbo, April, MD  tamsulosin (FLOMAX) 0.4 MG CAPS capsule Take 1 capsule (0.4 mg total) by mouth daily. 12/22/20   Palumbo, April, MD    Allergies    Penicillins  Review of Systems   Review of  Systems  All other systems reviewed and are negative.  Physical Exam Updated Vital Signs BP (!) 149/82 (BP Location: Left Arm)   Pulse 65   Temp 97.6 F (36.4 C) (Oral)   Resp 16   SpO2 95%   Physical Exam Vitals reviewed.  Constitutional:      Appearance: Normal appearance.  HENT:     Head: Normocephalic and atraumatic.  Eyes:     General:        Right eye: No discharge.        Left eye: No discharge.     Conjunctiva/sclera: Conjunctivae normal.  Pulmonary:     Effort: Pulmonary effort is normal.  Abdominal:     Comments: Abdomen is soft and nontender to palpation.  No obvious distention, masses, or hernia.  No guarding or rebound.  Skin:    General: Skin is warm and dry.     Findings: No rash.  Neurological:     General: No focal deficit present.     Mental Status: He is alert.  Psychiatric:        Mood and Affect: Mood normal.        Behavior: Behavior normal.    ED Results / Procedures / Treatments   Labs (all labs ordered are listed, but only abnormal results are displayed) Labs Reviewed  URINALYSIS, ROUTINE W REFLEX MICROSCOPIC - Abnormal; Notable for the following components:      Result Value   APPearance HAZY (*)    Hgb urine dipstick LARGE (*)    Ketones, ur 20 (*)    Protein, ur 30 (*)    Leukocytes,Ua MODERATE (*)    RBC / HPF >50 (*)    WBC, UA >50 (*)    Bacteria, UA FEW (*)    All other components within normal limits  URINE CULTURE    EKG None  Radiology No results found.  Procedures Procedures   Medications Ordered in ED Medications - No data to display  ED Course  I have reviewed the triage vital signs and the nursing notes.  Pertinent labs & imaging results that were available during my care of the patient were reviewed by me and considered in my medical decision making (see chart for details).  Clinical Course as of 08/04/21 1603  Thu Aug 04, 2021  1304 I discussed this case with my attending physician who cosigned this  note including patient's presenting symptoms, physical exam, and planned diagnostics and interventions. Attending physician stated agreement with plan or made changes to plan which were implemented.   Attending physician assessed patient at bedside.   [CF]    Clinical Course User Index [CF] Jolyn Lent   MDM  Rules/Calculators/A&P                          Kenneth Woods is a 87 y.o. male who presents to the emergency department for further evaluation of Foley catheter problem.  His Foley catheter was replaced in the department.  Urinalysis showed urinary tract infection.  I placed him on Bactrim.  Urine culture was sent.  We can adjust the antibiotics later depending on what the culture shows.  Urine has been flowing in the bag and he feels much better.  He is stable for discharge.   Final Clinical Impression(s) / ED Diagnoses Final diagnoses:  Problem with Foley catheter, initial encounter (HCC)  Urinary tract infection with hematuria, site unspecified    Rx / DC Orders ED Discharge Orders          Ordered    sulfamethoxazole-trimethoprim (BACTRIM DS) 800-160 MG tablet  2 times daily        08/04/21 1558             Honor Loh [place], [place] 08/04/21 1603    Linwood Dibbles, MD 08/05/21 [phone]

## 2021-08-04 NOTE — ED Provider Notes (Signed)
Emergency Medicine Provider Triage Evaluation Note  Kenneth Woods , a 87 y.o. male  was evaluated in triage.  Pt complains of catheter issues.  Patient is a challenging historian, reports he had a catheter in place just 2 days ago but it is not draining.    Review of Systems  Positive: Catheter problem Negative: Denies any dysuria, abdominal pain, nausea, hematuria, fevers.  Physical Exam  There were no vitals taken for this visit. Gen:   Awake, no distress   Resp:  Normal effort  MSK:   Moves extremities without difficulty  Other:  Abdomen is soft and nontender  Medical Decision Making  Medically screening exam initiated at 12:03 PM.  Appropriate orders placed.  DAVARI LOPES was informed that the remainder of the evaluation will be completed by another provider, this initial triage assessment does not replace that evaluation, and the importance of remaining in the ED until their evaluation is complete.  Patient appears to just need a catheter switched out.  No other complaints noted, patient stable to be seen in the back.   Theron Arista, PA-C 08/04/21 1204    Tanda Rockers A, DO 08/05/21 2002

## 2021-08-06 LAB — URINE CULTURE: Culture: >=100000 — AB

## 2021-08-07 ENCOUNTER — Telehealth: Payer: Self-pay | Admitting: Emergency Medicine

## 2021-08-07 NOTE — Telephone Encounter (Signed)
Post ED Visit - Positive Culture Follow-up  Culture report reviewed by antimicrobial stewardship pharmacist: [facility] []  , Pharm.D. []  Enzo Bi, Pharm.D., BCPS AQ-ID []  , Pharm.D., BCPS []  Celedonio Miyamoto, Pharm.D., BCPS []  [place], Garvin Fila.D., BCPS, AAHIVP []  , Pharm.D., BCPS, AAHIVP []  Georgina Pillion, PharmD, BCPS []  , PharmD, BCPS []  [place], PharmD, BCPS [x]  [address], PharmD []  , PharmD, BCPS []  Estella Husk, PharmD  Pharmacy Team []  Lysle Pearl, PharmD []  , PharmD []  Phillips Climes, PharmD []  , Rph []  Agapito Games) , PharmD []  Delmar Landau, PharmD []  , PharmD []  Mervyn Gay, PharmD []  , PharmD []  Vinnie Level, PharmD []  [facility], PharmD []  , PharmD []  Len Childs, PharmD   Positive urine culture Treated with Sulfamethoxazole-Trimethoprim, organism sensitive to the same and no further patient follow-up is required at this time.  Kenneth Woods 08/07/2021, 5:10 PM

## 2021-08-19 DIAGNOSIS — R338 Other retention of urine: Secondary | ICD-10-CM | POA: Diagnosis not present

## 2021-08-21 ENCOUNTER — Other Ambulatory Visit: Payer: Self-pay

## 2021-08-21 ENCOUNTER — Other Ambulatory Visit (HOSPITAL_BASED_OUTPATIENT_CLINIC_OR_DEPARTMENT_OTHER): Payer: Self-pay | Admitting: Radiology

## 2021-08-21 ENCOUNTER — Emergency Department (HOSPITAL_BASED_OUTPATIENT_CLINIC_OR_DEPARTMENT_OTHER): Payer: Medicare Other

## 2021-08-21 ENCOUNTER — Emergency Department (HOSPITAL_BASED_OUTPATIENT_CLINIC_OR_DEPARTMENT_OTHER)
Admission: EM | Admit: 2021-08-21 | Discharge: 2021-08-21 | Disposition: A | Discharge status: Home / Self Care | Payer: Medicare Other | Attending: Emergency Medicine | Admitting: Emergency Medicine

## 2021-08-21 ENCOUNTER — Encounter (HOSPITAL_BASED_OUTPATIENT_CLINIC_OR_DEPARTMENT_OTHER): Payer: Self-pay | Admitting: Emergency Medicine

## 2021-08-21 DIAGNOSIS — N451 Epididymitis: Secondary | ICD-10-CM | POA: Diagnosis not present

## 2021-08-21 DIAGNOSIS — Z79899 Other long term (current) drug therapy: Secondary | ICD-10-CM | POA: Diagnosis not present

## 2021-08-21 DIAGNOSIS — I10 Essential (primary) hypertension: Secondary | ICD-10-CM | POA: Insufficient documentation

## 2021-08-21 DIAGNOSIS — Z87891 Personal history of nicotine dependence: Secondary | ICD-10-CM | POA: Diagnosis not present

## 2021-08-21 DIAGNOSIS — J449 Chronic obstructive pulmonary disease, unspecified: Secondary | ICD-10-CM | POA: Insufficient documentation

## 2021-08-21 DIAGNOSIS — N50819 Testicular pain, unspecified: Secondary | ICD-10-CM | POA: Diagnosis present

## 2021-08-21 LAB — URINALYSIS, ROUTINE W REFLEX MICROSCOPIC
Bilirubin Urine: NEGATIVE
Glucose, UA: NEGATIVE mg/dL
Ketones, ur: NEGATIVE mg/dL
Nitrite: POSITIVE — AB
Specific Gravity, Urine: 1.01 (ref 1.005–1.030)
WBC, UA: >50 WBC/hpf — ABNORMAL HIGH (ref 0–5)
pH: 8 (ref 5.0–8.0)

## 2021-08-21 IMAGING — US US SCROTUM W/ DOPPLER COMPLETE
1 series · 14 of 25 positions shown · non-contrast
Comparison: None.

CLINICAL DATA: Left testicular pain.

EXAM:
SCROTAL ULTRASOUND
DOPPLER ULTRASOUND OF THE TESTICLES
TECHNIQUE: Complete ultrasound examination of the testicles, epididymis, and
other scrotal structures was performed. Color and spectral Doppler
ultrasound were also utilized to evaluate blood flow to the
testicles.

[Series 1: us scrotum w/doppler · 95 acquisitions, 14 frames shown]
[im 1/95]
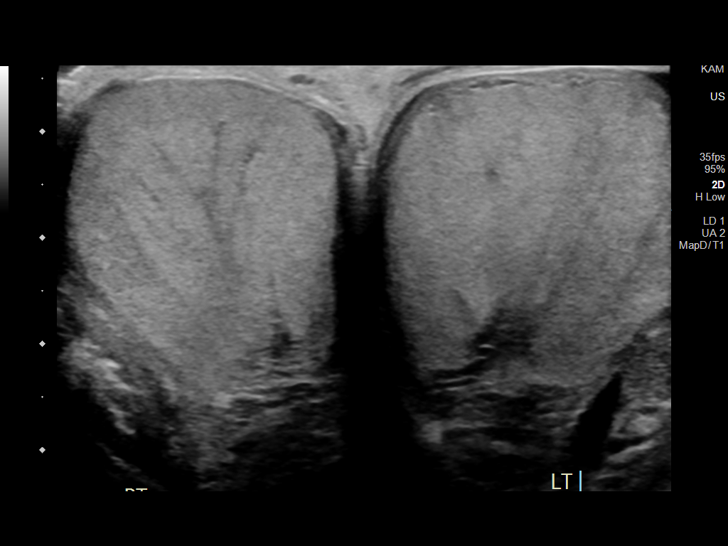
[im 8/95]
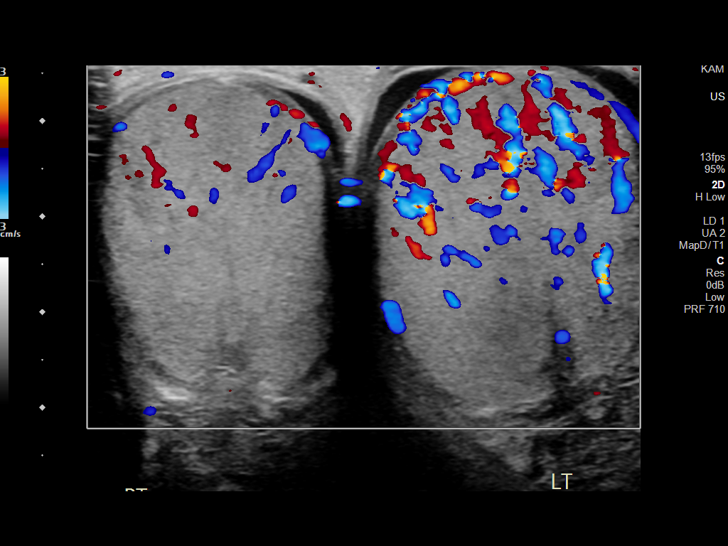
[im 16/95]
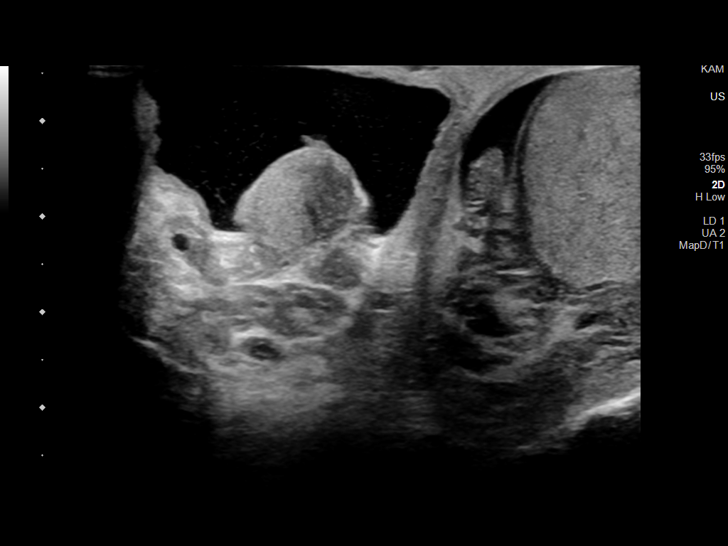
[im 24/95]
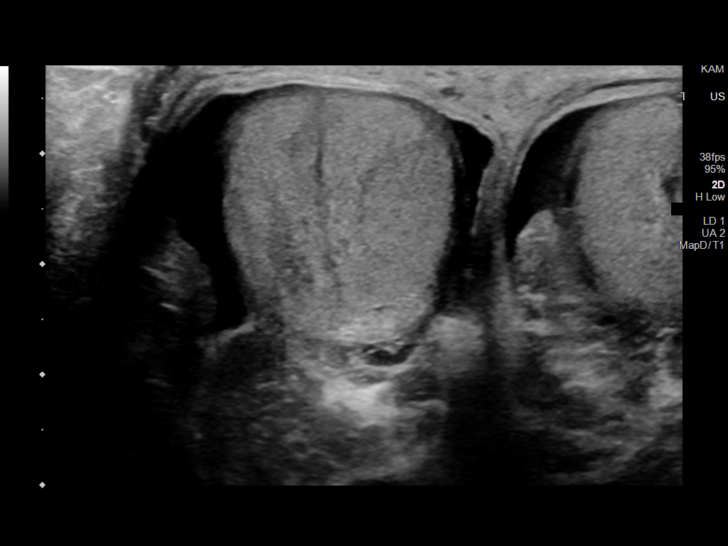
[im 32/95]
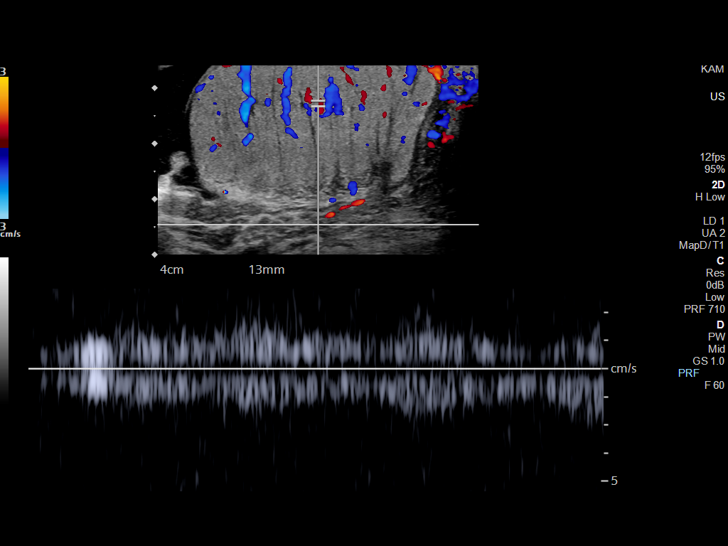
[im 36/95]
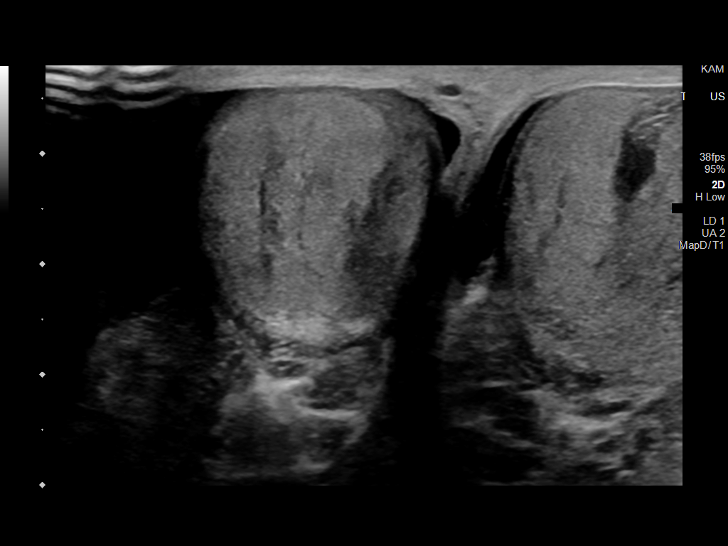
[im 44/95]
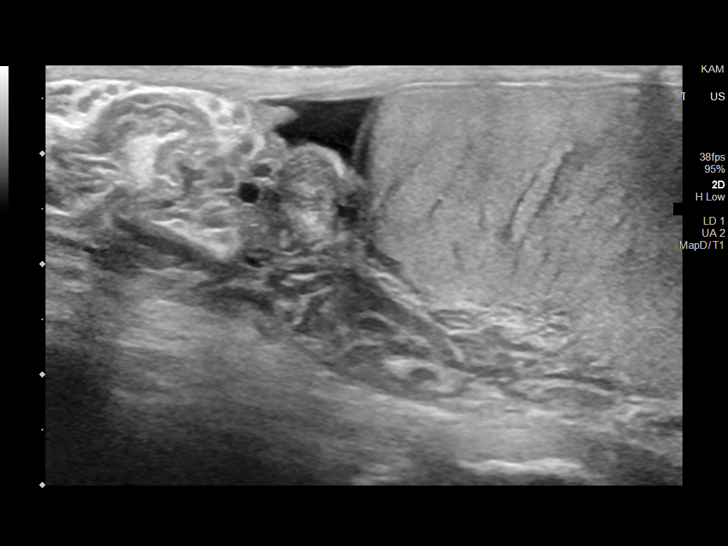
[im 51/95]
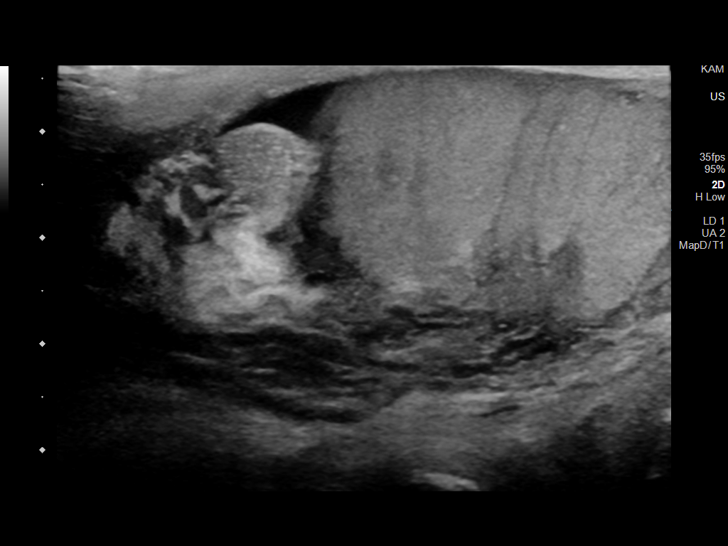
[im 59/95]
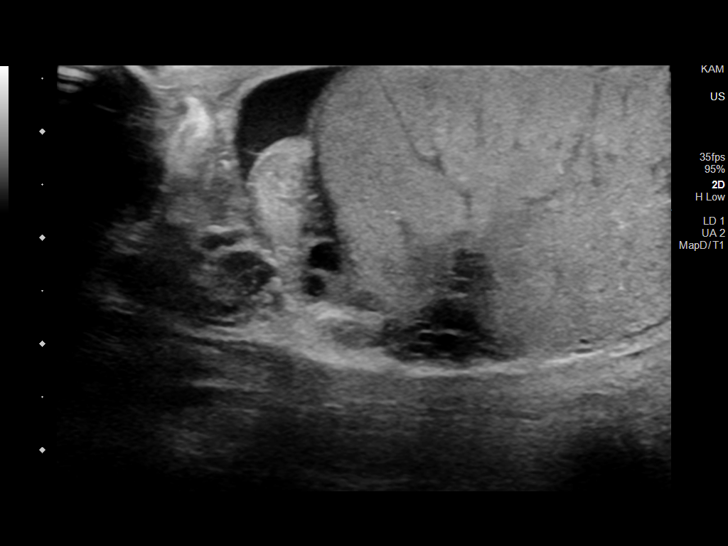
[im 63/95]
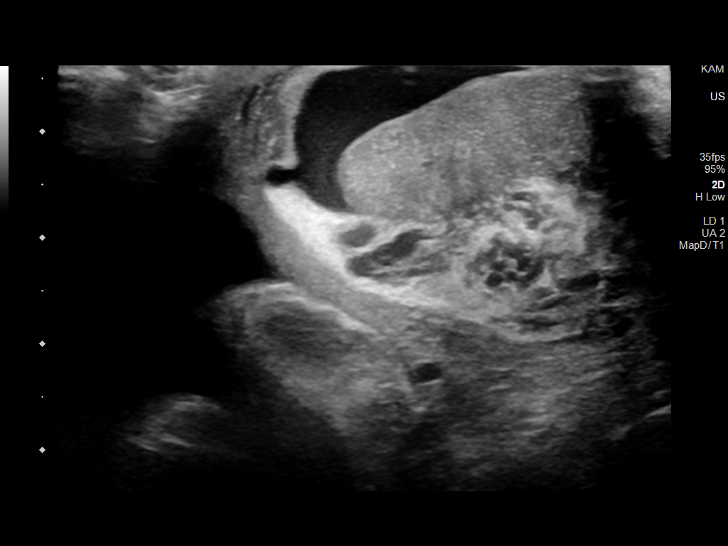
[im 71/95]
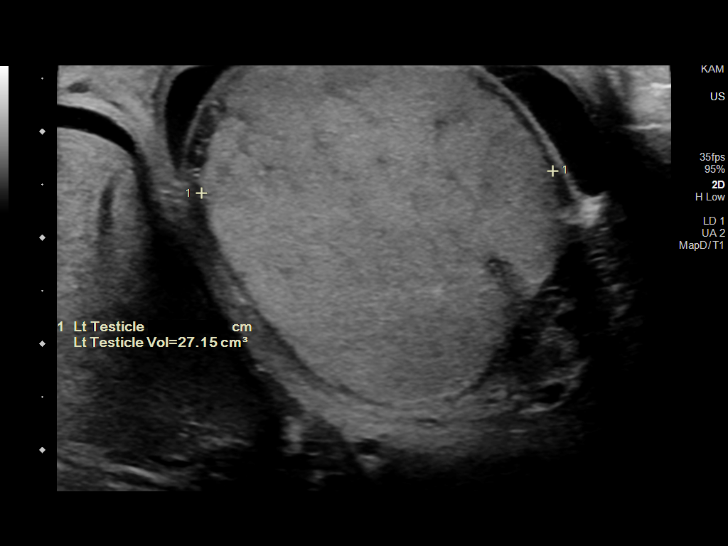
[im 79/95]
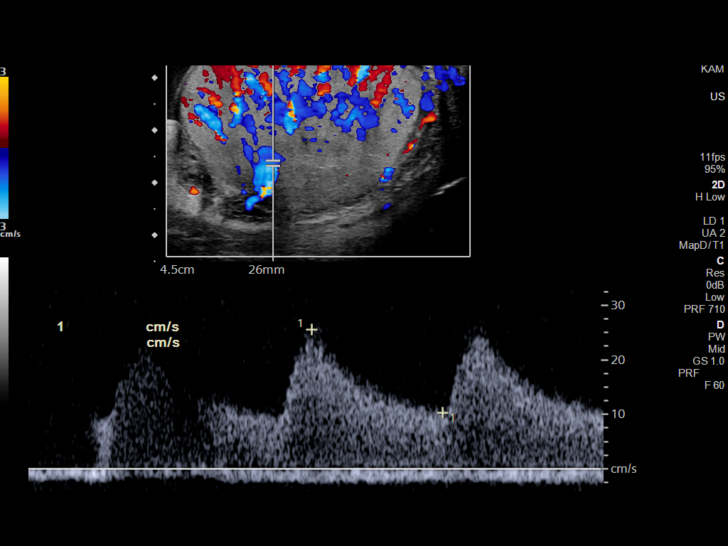
[im 87/95]
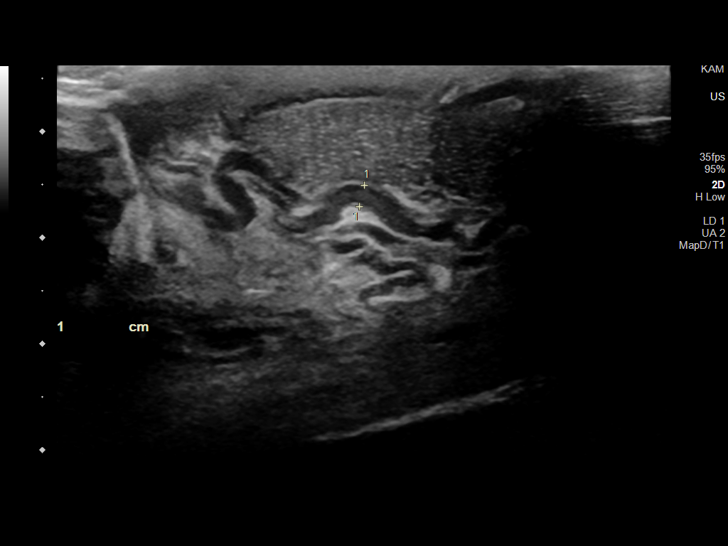
[im 95/95]
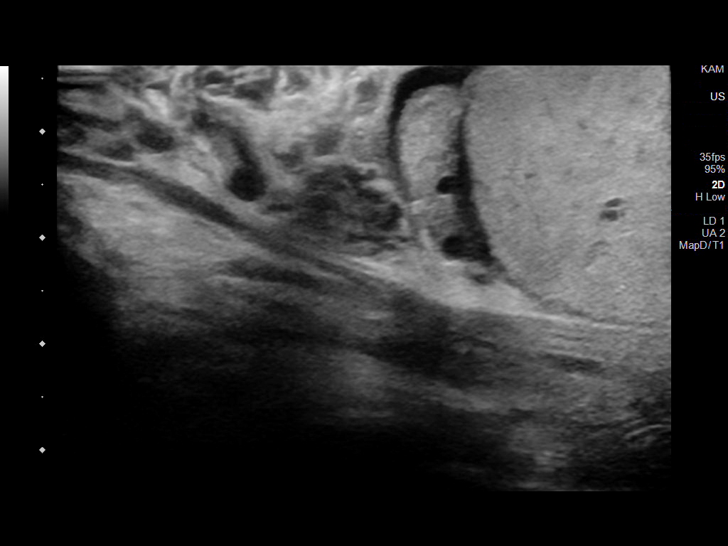

[14 of 25 positions shown; findings below may reference images not displayed]

FINDINGS: Right testicle

Measurements: 4.9 x 3.1 x 2.4 cm. No mass or microlithiasis
visualized.

Left testicle

Measurements: 4.8 x 3.3 x 3.3 cm. Heterogeneous echogenicity and
hyperemia noted.

Right epididymis:  Normal in size and appearance.

Left epididymis:  Increased vascularity.

Hydrocele:  Bilateral moderate hydrocele.

Varicocele:  None visualized.

Pulsed Doppler interrogation of both testes demonstrates normal low
resistance arterial and venous waveforms bilaterally.
IMPRESSION: Hyperemia of the left testicle and left epididymis with
heterogeneous echogenicity of the left testicle, likely representing
orchitis/epididymitis.

Bilateral moderate hydrocele.

## 2021-08-21 MED ORDER — ACETAMINOPHEN 325 MG PO TABS
650.0000 mg | ORAL_TABLET | Freq: Once | ORAL | Status: AC
  Administered 2021-08-21: 650 mg via ORAL
  Filled 2021-08-21: qty 2

## 2021-08-21 MED ORDER — CIPROFLOXACIN HCL 500 MG PO TABS
250.0000 mg | ORAL_TABLET | Freq: Once | ORAL | Status: AC
  Administered 2021-08-21: 250 mg via ORAL
  Filled 2021-08-21: qty 1

## 2021-08-21 MED ORDER — CIPROFLOXACIN HCL 250 MG PO TABS: 250.0000 mg | ORAL_TABLET | Freq: Two times a day (BID) | ORAL | 0 refills | Status: DC

## 2021-08-21 NOTE — Discharge Instructions (Signed)
Take the antibiotics as prescribed.  Follow-up with the urologist to make sure the infection has cleared

## 2021-08-21 NOTE — ED Triage Notes (Signed)
Pt. From home and had his catheter removed on Friday. Pt reports pain in his prostrate area. Pt stated that he was able to urinate last night.  Pt denis N/V.

## 2021-08-21 NOTE — ED Notes (Signed)
Pt is oriented to self, dob but is unable to give location or president. However, the pt has a tendency to answer all questions indirectly unless explicitly asked. Pt appears to be slightly confused but may not be paying attention to questions

## 2021-08-21 NOTE — ED Notes (Signed)
Pt states that he feels much better °

## 2021-08-21 NOTE — ED Provider Notes (Signed)
[facility] EMERGENCY DEPT Provider Note   CSN: [id number] Arrival date & time: 08/21/21  8588     History Chief Complaint  Patient presents with   Groin Pain    Kenneth Woods is a 87 y.o. male.   Groin Pain   Patient presents ED with complaints of testicle pain and discomfort.  History was obtained from the patient as well as his son.  Patient is a poor historian.  At baseline per family.  Patient went to the doctor's office this past week.  He had some type of procedure or examination done.  Patient states when he started standing up this morning he started in pain in his testicle area.  Did have a Foley catheter removed on Friday.  He is not having any trouble urinating.  Came to the ED because of the discomfort in the testicle region.  No fevers or chills.  No vomiting or diarrhea  Past Medical History:  Diagnosis Date   Arthritis    Back pain, chronic    BPH (benign prostatic hypertrophy) with urinary obstruction    COPD (chronic obstructive pulmonary disease) (HCC)    Elevated PSA 10/16/2010   H/O prostate biopsy    Hemorrhoids, external    Hyperlipidemia    Hypertension     Patient Active Problem List   Diagnosis Date Noted   COPD exacerbation (HCC) 12/13/2015   Elevated troponin 12/13/2015   Hypokalemia 12/13/2015   Retention of urine    Hyperlipidemia    Hypertension    COPD (chronic obstructive pulmonary disease) (HCC)    Arthritis    BPH with elevated PSA    Elevated PSA 10/16/2010    Past Surgical History:  Procedure Laterality Date   FRACTURE SURGERY  1959   Work accident-caused FX whole body expect rt arm,was in body cast- was working on roof- hosp for 5 mos.   INGUINAL HERNIA REPAIR Bilateral 1983   PROSTATE BIOPSY N/A 06/20/2013   Procedure: PROSTATE BIOPSY;  Surgeon: Garnett Farm, MD;  Location: [facility];  Service: Urology;  Laterality: N/A;  1 hour req for this case  ULTRASOUND EQUIPHMENT TO BE PROVIDED BY  [facility]       Family History  Problem Relation Age of Onset   CVA Mother    Cancer Father        brain tumor    Social History   Tobacco Use   Smoking status: Former    Packs/day: 1.00    Years: 30.00    Pack years: 30.00    Types: Cigarettes    Quit date: 06/09/2005    Years since quitting: 16.2   Smokeless tobacco: Never  Substance Use Topics   Alcohol use: Yes    Comment: OCCASIONAL   Drug use: No    Home Medications Prior to Admission medications   Medication Sig Start Date End Date Taking? Authorizing Provider  ciprofloxacin (CIPRO) 250 MG tablet Take 1 tablet (250 mg total) by mouth every 12 (twelve) hours. 08/21/21  Yes Linwood Dibbles, MD  albuterol (PROVENTIL HFA;VENTOLIN HFA) 108 (90 Base) MCG/ACT inhaler Inhale 2 puffs into the lungs every 6 (six) hours as needed for wheezing or shortness of breath. 12/13/15   Dhungel, Nishant, MD  amLODipine (NORVASC) 5 MG tablet TAKE 1 TABLET BY MOUTH EVERY DAY 11/15/15   Allayne Butcher B, PA-C  finasteride (PROSCAR) 5 MG tablet TAKE 1 TABLET (5 MG TOTAL) BY MOUTH DAILY. 11/15/15   Dorena Bodo, PA-C  guaiFENesin-dextromethorphan (ROBITUSSIN DM) 100-10 MG/5ML syrup Take 5 mLs by mouth every 4 (four) hours as needed for cough. 12/13/15   Dhungel, Theda Belfast, MD  lisinopril (PRINIVIL,ZESTRIL) 20 MG tablet TAKE 1 TABLET BY MOUTH EVERY DAY 09/29/15   Dorena Bodo, PA-C  nitrofurantoin, macrocrystal-monohydrate, (MACROBID) 100 MG capsule Take 1 capsule (100 mg total) by mouth 2 (two) times daily. 01/08/21   Pricilla Loveless, MD  OVER THE COUNTER MEDICATION Take 2 capsules by mouth 2 (two) times daily. PROSTA-STRONG (saw palmetto,lycopene,pumpkin seed extract)    [provider]  tamsulosin (FLOMAX) 0.4 MG CAPS capsule TAKE ONE CAPSULE BY MOUTH EVERY DAY AFTER SUPPER 06/18/15   Dorena Bodo, PA-C  tamsulosin (FLOMAX) 0.4 MG CAPS capsule Take 1 capsule (0.4 mg total) by mouth daily. 08/28/20   Palumbo, April, MD  tamsulosin (FLOMAX)  0.4 MG CAPS capsule Take 1 capsule (0.4 mg total) by mouth daily. 12/22/20   Palumbo, April, MD    Allergies    Penicillins  Review of Systems   Review of Systems  All other systems reviewed and are negative.  Physical Exam Updated Vital Signs BP (!) 128/41 (BP Location: Right Arm)   Pulse (!) 58   Temp 97.9 F (36.6 C) (Oral)   Resp 18   Ht 1.753 m (5\' 9" )   Wt 72.6 kg   SpO2 97%   BMI 23.63 kg/m   Physical Exam Vitals and nursing note reviewed.  Constitutional:      General: He is not in acute distress.    Appearance: He is well-developed.  HENT:     Head: Normocephalic and atraumatic.     Right Ear: External ear normal.     Left Ear: External ear normal.  Eyes:     General: No scleral icterus.       Right eye: No discharge.        Left eye: No discharge.     Conjunctiva/sclera: Conjunctivae normal.  Neck:     Trachea: No tracheal deviation.  Cardiovascular:     Rate and Rhythm: Normal rate.  Pulmonary:     Effort: Pulmonary effort is normal. No respiratory distress.     Breath sounds: No stridor.  Abdominal:     General: There is no distension.  Genitourinary:    Penis: No discharge.      Testes:        Right: Mass not present.        Left: Tenderness and swelling present. Mass, testicular hydrocele or varicocele not present.  Musculoskeletal:        General: No swelling or deformity.     Cervical back: Neck supple.  Skin:    General: Skin is warm and dry.     Findings: No rash.  Neurological:     Mental Status: He is alert.     Cranial Nerves: Cranial nerve deficit: no gross deficits.    ED Results / Procedures / Treatments   Labs (all labs ordered are listed, but only abnormal results are displayed) Labs Reviewed  URINALYSIS, ROUTINE W REFLEX MICROSCOPIC - Abnormal; Notable for the following components:      Result Value   APPearance HAZY (*)    Hgb urine dipstick TRACE (*)    Protein, ur TRACE (*)    Nitrite POSITIVE (*)    Leukocytes,Ua  LARGE (*)    WBC, UA >50 (*)    Bacteria, UA MANY (*)    All other components within normal limits  EKG None  Radiology US SCROTUM W/DOPPLER  Result Date: 08/21/2021 CLINICAL DATA:  Left testicular pain. EXAM: SCROTAL ULTRASOUND DOPPLER ULTRASOUND OF THE TESTICLES TECHNIQUE: Complete ultrasound examination of the testicles, epididymis, and other scrotal structures was performed. Color and spectral Doppler ultrasound were also utilized to evaluate blood flow to the testicles. COMPARISON:  None. FINDINGS: Right testicle Measurements: 4.9 x 3.1 x 2.4 cm. No mass or microlithiasis visualized. Left testicle Measurements: 4.8 x 3.3 x 3.3 cm. Heterogeneous echogenicity and hyperemia noted. Right epididymis:  Normal in size and appearance. Left epididymis:  Increased vascularity. Hydrocele:  Bilateral moderate hydrocele. Varicocele:  None visualized. Pulsed Doppler interrogation of both testes demonstrates normal low resistance arterial and venous waveforms bilaterally. IMPRESSION: Hyperemia of the left testicle and left epididymis with heterogeneous echogenicity of the left testicle, likely representing orchitis/epididymitis. Bilateral moderate hydrocele. Electronically Signed   By: Ted Mcalpine M.D.   On: 08/21/2021 12:45    Procedures Procedures   Medications Ordered in ED Medications  acetaminophen (TYLENOL) tablet 650 mg (650 mg Oral Given 08/21/21 1028)  ciprofloxacin (CIPRO) tablet 250 mg (250 mg Oral Given 08/21/21 1320)    ED Course  I have reviewed the triage vital signs and the nursing notes.  Pertinent labs & imaging results that were available during my care of the patient were reviewed by me and considered in my medical decision making (see chart for details).  Clinical Course as of 08/22/21 1344  Sun Aug 21, 2021  1308 Urinalysis does show many bacteria and greater than 50 white blood cells [JK]  1309 Ultrasound of the testicles consistent with epididymitis orchitis [JK]     Clinical Course User Index [JK] Linwood Dibbles, MD   MDM Rules/Calculators/A&P                           Pt presented with testicular pain.  UA suggestive of infection.   US shows evidence of epididymitis.  No fever, non toxic.  No indication for hospitalization.   Previous urine culture reviewed.  Will treat with po abx.  Outpatient follow up with urology.  Findings discussed with patient and family. Final Clinical Impression(s) / ED Diagnoses Final diagnoses:  Epididymitis, left    Rx / DC Orders ED Discharge Orders          Ordered    ciprofloxacin (CIPRO) 250 MG tablet  Every 12 hours        08/21/21 1310             Linwood Dibbles, MD 08/22/21 1347

## 2021-08-21 NOTE — ED Notes (Signed)
Ultrasound complete 

## 2021-10-02 ENCOUNTER — Encounter (HOSPITAL_BASED_OUTPATIENT_CLINIC_OR_DEPARTMENT_OTHER): Payer: Self-pay

## 2021-10-02 ENCOUNTER — Other Ambulatory Visit: Payer: Self-pay

## 2021-10-02 DIAGNOSIS — J449 Chronic obstructive pulmonary disease, unspecified: Secondary | ICD-10-CM | POA: Diagnosis not present

## 2021-10-02 DIAGNOSIS — R39198 Other difficulties with micturition: Secondary | ICD-10-CM | POA: Diagnosis not present

## 2021-10-02 DIAGNOSIS — Z79899 Other long term (current) drug therapy: Secondary | ICD-10-CM | POA: Diagnosis not present

## 2021-10-02 DIAGNOSIS — Z87891 Personal history of nicotine dependence: Secondary | ICD-10-CM | POA: Insufficient documentation

## 2021-10-02 DIAGNOSIS — I10 Essential (primary) hypertension: Secondary | ICD-10-CM | POA: Insufficient documentation

## 2021-10-02 DIAGNOSIS — R339 Retention of urine, unspecified: Secondary | ICD-10-CM | POA: Insufficient documentation

## 2021-10-02 NOTE — ED Triage Notes (Signed)
Patient reports that he has not been able to use the bathroom (urinate) in over 6 hours.

## 2021-10-03 ENCOUNTER — Emergency Department (HOSPITAL_BASED_OUTPATIENT_CLINIC_OR_DEPARTMENT_OTHER)
Admission: EM | Admit: 2021-10-03 | Discharge: 2021-10-03 | Disposition: A | Discharge status: Home / Self Care | Payer: Medicare Other | Attending: Emergency Medicine | Admitting: Emergency Medicine

## 2021-10-03 DIAGNOSIS — R339 Retention of urine, unspecified: Secondary | ICD-10-CM

## 2021-10-03 LAB — URINALYSIS, ROUTINE W REFLEX MICROSCOPIC
Bilirubin Urine: NEGATIVE
Glucose, UA: NEGATIVE mg/dL
Ketones, ur: NEGATIVE mg/dL
Leukocytes,Ua: NEGATIVE
Nitrite: NEGATIVE
Protein, ur: NEGATIVE mg/dL
Specific Gravity, Urine: 1.007 (ref 1.005–1.030)
pH: 5.5 (ref 5.0–8.0)

## 2021-10-03 NOTE — ED Provider Notes (Signed)
[facility] EMERGENCY DEPT Provider Note   CSN: [id number] Arrival date & time: 10/02/21  2342     History Chief Complaint  Patient presents with   Urinary Retention    Kenneth Woods is a 87 y.o. male.  HPI     Is an 87 year old male with a history of BPH, COPD who presents with urinary retention.  Patient states he has had difficulty urinating over the last 6 hours.  History of urinary retention and Foley catheter placement in the past.  Denies fevers or back pain.  She reports some lower abdominal pressure and discomfort.  Foley catheter was placed prior to my arrival in the room.  Approximately 700 cc output at this time.  Patient has had resolution and improvement of symptoms.  Past Medical History:  Diagnosis Date   Arthritis    Back pain, chronic    BPH (benign prostatic hypertrophy) with urinary obstruction    COPD (chronic obstructive pulmonary disease) (HCC)    Elevated PSA 10/16/2010   H/O prostate biopsy    Hemorrhoids, external    Hyperlipidemia    Hypertension     Patient Active Problem List   Diagnosis Date Noted   COPD exacerbation (HCC) 12/13/2015   Elevated troponin 12/13/2015   Hypokalemia 12/13/2015   Retention of urine    Hyperlipidemia    Hypertension    COPD (chronic obstructive pulmonary disease) (HCC)    Arthritis    BPH with elevated PSA    Elevated PSA 10/16/2010    Past Surgical History:  Procedure Laterality Date   FRACTURE SURGERY  1959   Work accident-caused FX whole body expect rt arm,was in body cast- was working on roof- hosp for 5 mos.   INGUINAL HERNIA REPAIR Bilateral 1983   PROSTATE BIOPSY N/A 06/20/2013   Procedure: PROSTATE BIOPSY;  Surgeon: Garnett Farm, MD;  Location: [facility];  Service: Urology;  Laterality: N/A;  1 hour req for this case  ULTRASOUND EQUIPHMENT TO BE PROVIDED BY [facility]       Family History  Problem Relation Age of Onset   CVA Mother    Cancer  Father        brain tumor    Social History   Tobacco Use   Smoking status: Former    Packs/day: 1.00    Years: 30.00    Pack years: 30.00    Types: Cigarettes    Quit date: 06/09/2005    Years since quitting: 16.3   Smokeless tobacco: Never  Substance Use Topics   Alcohol use: Yes    Comment: OCCASIONAL   Drug use: No    Home Medications Prior to Admission medications   Medication Sig Start Date End Date Taking? Authorizing Provider  albuterol (PROVENTIL HFA;VENTOLIN HFA) 108 (90 Base) MCG/ACT inhaler Inhale 2 puffs into the lungs every 6 (six) hours as needed for wheezing or shortness of breath. 12/13/15   Dhungel, Theda Belfast, MD  amLODipine (NORVASC) 5 MG tablet TAKE 1 TABLET BY MOUTH EVERY DAY 11/15/15   Dorena Bodo, PA-C  ciprofloxacin (CIPRO) 250 MG tablet Take 1 tablet (250 mg total) by mouth every 12 (twelve) hours. 08/21/21   Linwood Dibbles, MD  finasteride (PROSCAR) 5 MG tablet TAKE 1 TABLET (5 MG TOTAL) BY MOUTH DAILY. 11/15/15   Dixon, Mary B, PA-C  guaiFENesin-dextromethorphan (ROBITUSSIN DM) 100-10 MG/5ML syrup Take 5 mLs by mouth every 4 (four) hours as needed for cough. 12/13/15   Eddie North, MD  lisinopril (PRINIVIL,ZESTRIL) 20 MG tablet TAKE 1 TABLET BY MOUTH EVERY DAY 09/29/15   Dorena Bodo, PA-C  nitrofurantoin, macrocrystal-monohydrate, (MACROBID) 100 MG capsule Take 1 capsule (100 mg total) by mouth 2 (two) times daily. 01/08/21   Pricilla Loveless, MD  OVER THE COUNTER MEDICATION Take 2 capsules by mouth 2 (two) times daily. PROSTA-STRONG (saw palmetto,lycopene,pumpkin seed extract)    [provider]  tamsulosin (FLOMAX) 0.4 MG CAPS capsule TAKE ONE CAPSULE BY MOUTH EVERY DAY AFTER SUPPER 06/18/15   Dorena Bodo, PA-C  tamsulosin (FLOMAX) 0.4 MG CAPS capsule Take 1 capsule (0.4 mg total) by mouth daily. 08/28/20   Palumbo, April, MD  tamsulosin (FLOMAX) 0.4 MG CAPS capsule Take 1 capsule (0.4 mg total) by mouth daily. 12/22/20   Palumbo, April, MD     Allergies    Penicillins  Review of Systems   Review of Systems  Constitutional:  Negative for fever.  Cardiovascular:  Negative for chest pain.  Gastrointestinal:  Negative for abdominal pain.  Genitourinary:  Positive for difficulty urinating. Negative for dysuria and flank pain.  All other systems reviewed and are negative.  Physical Exam Updated Vital Signs BP (!) 181/94    Pulse 63    Temp (!) 97.4 F (36.3 C)    Resp 16    Ht 1.676 m (5\' 6" )    Wt 62.5 kg    SpO2 97%    BMI 22.23 kg/m   Physical Exam Vitals and nursing note reviewed.  Constitutional:      Appearance: He is well-developed.     Comments: Chronically ill-appearing  HENT:     Head: Normocephalic and atraumatic.     Mouth/Throat:     Mouth: Mucous membranes are moist.  Eyes:     Pupils: Pupils are equal, round, and reactive to light.  Cardiovascular:     Rate and Rhythm: Normal rate and regular rhythm.     Heart sounds: Normal heart sounds.  Pulmonary:     Effort: Pulmonary effort is normal. No respiratory distress.     Breath sounds: Normal breath sounds.  Abdominal:     Palpations: Abdomen is soft.     Tenderness: There is no abdominal tenderness. There is no rebound.  Genitourinary:    Comments: Foley catheter in place Musculoskeletal:     Cervical back: Neck supple.     Right lower leg: No edema.     Left lower leg: No edema.  Lymphadenopathy:     Cervical: No cervical adenopathy.  Skin:    General: Skin is warm and dry.  Neurological:     Mental Status: He is alert and oriented to person, place, and time.  Psychiatric:        Mood and Affect: Mood normal.    ED Results / Procedures / Treatments   Labs (all labs ordered are listed, but only abnormal results are displayed) Labs Reviewed  URINALYSIS, ROUTINE W REFLEX MICROSCOPIC - Abnormal; Notable for the following components:      Result Value   Color, Urine COLORLESS (*)    Hgb urine dipstick MODERATE (*)    Bacteria, UA RARE  (*)    All other components within normal limits    EKG None  Radiology No results found.  Procedures Procedures   Medications Ordered in ED Medications - No data to display  ED Course  I have reviewed the triage vital signs and the nursing notes.  Pertinent labs & imaging results that were available during  my care of the patient were reviewed by me and considered in my medical decision making (see chart for details).    MDM Rules/Calculators/A&P                          Patient presents with concern for urinary retention.  He is nontoxic and vital signs are reassuring.  No other systemic symptoms.  Foley catheter was placed.  Ultimately greater than 700 cc output.  Urinalysis was sent.  No evidence of urinary tract infection.  Suspect his known BPH.  Recommend close follow-up with urology as an outpatient in approximately 1 week.  He was provided with Foley catheter care instructions.  After history, exam, and medical workup I feel the patient has been appropriately medically screened and is safe for discharge home. Pertinent diagnoses were discussed with the patient. Patient was given return precautions.      Final Clinical Impression(s) / ED Diagnoses Final diagnoses:  Urinary retention    Rx / DC Orders ED Discharge Orders     None        Tylee Yum, Mayer Masker, MD 10/03/21 [phone]

## 2021-10-03 NOTE — ED Notes (Signed)
Leg bag applied

## 2021-10-03 NOTE — Discharge Instructions (Signed)
You were seen today for urinary retention.  He had a Foley catheter placed.  Follow-up with your urologist in approximately 1 week for Foley catheter removal.  Your urine does not show any evidence of infection.

## 2021-10-03 NOTE — ED Notes (Signed)
875 ml urine bladder scan

## 2021-10-18 ENCOUNTER — Emergency Department (HOSPITAL_BASED_OUTPATIENT_CLINIC_OR_DEPARTMENT_OTHER)
Admission: EM | Admit: 2021-10-18 | Discharge: 2021-10-18 | Discharge status: Against Medical Advice | Payer: Medicare Other | Attending: Emergency Medicine | Admitting: Emergency Medicine

## 2021-10-18 ENCOUNTER — Other Ambulatory Visit: Payer: Self-pay

## 2021-10-18 ENCOUNTER — Encounter (HOSPITAL_BASED_OUTPATIENT_CLINIC_OR_DEPARTMENT_OTHER): Payer: Self-pay

## 2021-10-18 DIAGNOSIS — Y658 Other specified misadventures during surgical and medical care: Secondary | ICD-10-CM | POA: Insufficient documentation

## 2021-10-18 DIAGNOSIS — R3914 Feeling of incomplete bladder emptying: Secondary | ICD-10-CM | POA: Diagnosis not present

## 2021-10-18 DIAGNOSIS — T83091A Other mechanical complication of indwelling urethral catheter, initial encounter: Secondary | ICD-10-CM | POA: Diagnosis not present

## 2021-10-18 DIAGNOSIS — Z5321 Procedure and treatment not carried out due to patient leaving prior to being seen by health care provider: Secondary | ICD-10-CM | POA: Diagnosis not present

## 2021-10-18 NOTE — ED Triage Notes (Signed)
Pt presents to the ED from home. States that he has had prostate issues in the past that have kept him from urinating. States that he had a catheter placed and reports that sometimes he is able to make urine and sometimes he is not. Pt states that he urinated around the catheter prior to triage. States that he still feels the urge to urinate. Pt A&Ox4 at time of triage. VSS

## 2021-11-14 DIAGNOSIS — R3914 Feeling of incomplete bladder emptying: Secondary | ICD-10-CM | POA: Diagnosis not present

## 2021-11-14 DIAGNOSIS — R8279 Other abnormal findings on microbiological examination of urine: Secondary | ICD-10-CM | POA: Diagnosis not present

## 2022-01-27 ENCOUNTER — Encounter (HOSPITAL_BASED_OUTPATIENT_CLINIC_OR_DEPARTMENT_OTHER): Payer: Self-pay | Admitting: Emergency Medicine

## 2022-01-27 ENCOUNTER — Other Ambulatory Visit: Payer: Self-pay

## 2022-01-27 ENCOUNTER — Emergency Department (HOSPITAL_BASED_OUTPATIENT_CLINIC_OR_DEPARTMENT_OTHER)
Admission: EM | Admit: 2022-01-27 | Discharge: 2022-01-27 | Disposition: A | Discharge status: Home / Self Care | Payer: Medicare Other | Attending: Emergency Medicine | Admitting: Emergency Medicine

## 2022-01-27 ENCOUNTER — Emergency Department (HOSPITAL_BASED_OUTPATIENT_CLINIC_OR_DEPARTMENT_OTHER): Payer: Medicare Other

## 2022-01-27 DIAGNOSIS — D72829 Elevated white blood cell count, unspecified: Secondary | ICD-10-CM | POA: Insufficient documentation

## 2022-01-27 DIAGNOSIS — N452 Orchitis: Secondary | ICD-10-CM | POA: Insufficient documentation

## 2022-01-27 DIAGNOSIS — R109 Unspecified abdominal pain: Secondary | ICD-10-CM | POA: Diagnosis not present

## 2022-01-27 DIAGNOSIS — I700 Atherosclerosis of aorta: Secondary | ICD-10-CM | POA: Diagnosis not present

## 2022-01-27 DIAGNOSIS — N390 Urinary tract infection, site not specified: Secondary | ICD-10-CM | POA: Diagnosis not present

## 2022-01-27 LAB — BASIC METABOLIC PANEL
Anion gap: 6 (ref 5–15)
BUN: 18 mg/dL (ref 8–23)
CO2: 26 mmol/L (ref 22–32)
Calcium: 9.5 mg/dL (ref 8.9–10.3)
Chloride: 104 mmol/L (ref 98–111)
Creatinine, Ser: 0.82 mg/dL (ref 0.61–1.24)
GFR, Estimated: >60 mL/min (ref >60)
Glucose, Bld: 119 mg/dL — ABNORMAL HIGH (ref 70–99)
Potassium: 4 mmol/L (ref 3.5–5.1)
Sodium: 136 mmol/L (ref 135–145)

## 2022-01-27 LAB — URINALYSIS, ROUTINE W REFLEX MICROSCOPIC
Bilirubin Urine: NEGATIVE
Glucose, UA: NEGATIVE mg/dL
Ketones, ur: NEGATIVE mg/dL
Nitrite: NEGATIVE
Specific Gravity, Urine: 1.017 (ref 1.005–1.030)
pH: 7.5 (ref 5.0–8.0)

## 2022-01-27 LAB — CBC
HCT: 42.5 % (ref 39.0–52.0)
Hemoglobin: 13.7 g/dL (ref 13.0–17.0)
MCH: 28.7 pg (ref 26.0–34.0)
MCHC: 32.2 g/dL (ref 30.0–36.0)
MCV: 88.9 fL (ref 80.0–100.0)
Platelets: 172 10*3/uL (ref 150–400)
RBC: 4.78 MIL/uL (ref 4.22–5.81)
RDW: 14.3 % (ref 11.5–15.5)
WBC: 16.3 10*3/uL — ABNORMAL HIGH (ref 4.0–10.5)
nRBC: 0 % (ref 0.0–0.2)

## 2022-01-27 IMAGING — US US SCROTUM W/ DOPPLER COMPLETE
1 series · 13 of 25 positions shown · non-contrast
Comparison: Scrotal ultrasound [DATE].

CLINICAL DATA: Right testicular pain.

EXAM:
SCROTAL ULTRASOUND
DOPPLER ULTRASOUND OF THE TESTICLES
TECHNIQUE: Complete ultrasound examination of the testicles, epididymis, and
other scrotal structures was performed. Color and spectral Doppler
ultrasound were also utilized to evaluate blood flow to the
testicles.

[Series 1: us scrotum w/doppler · 13 of 59 slices shown]
[im 1/59]
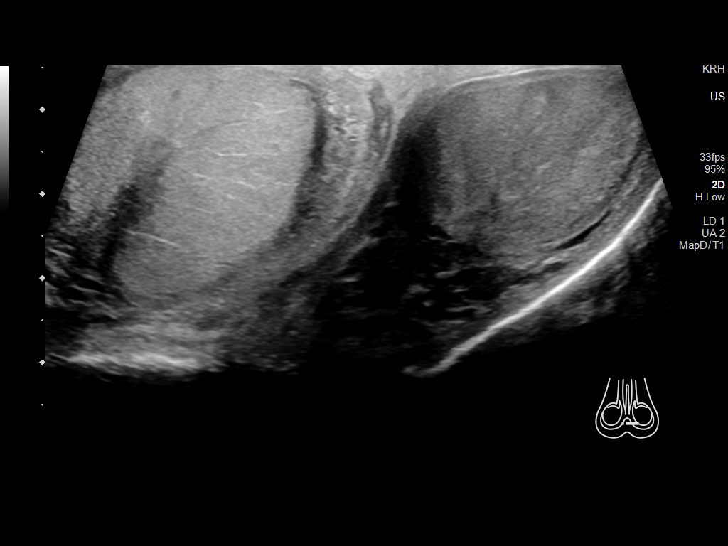
[im 5/59]
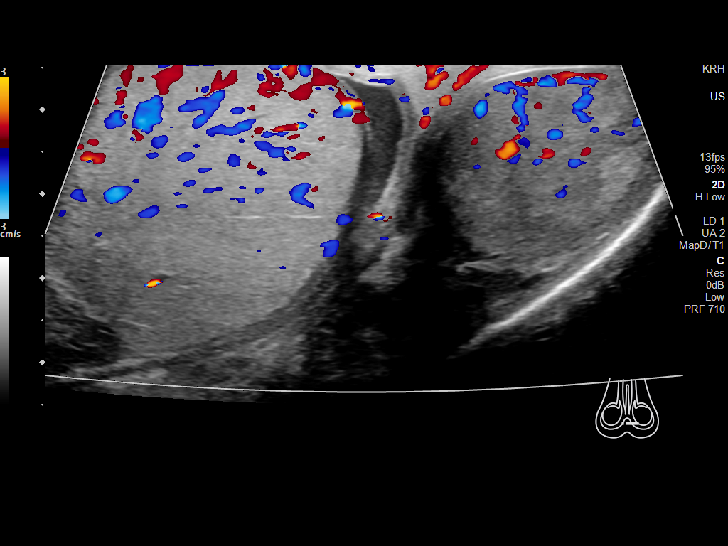
[im 10/59]
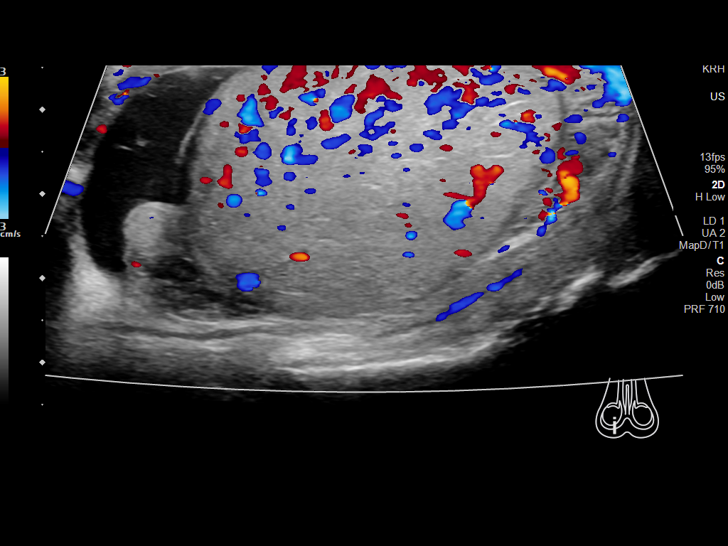
[im 15/59]
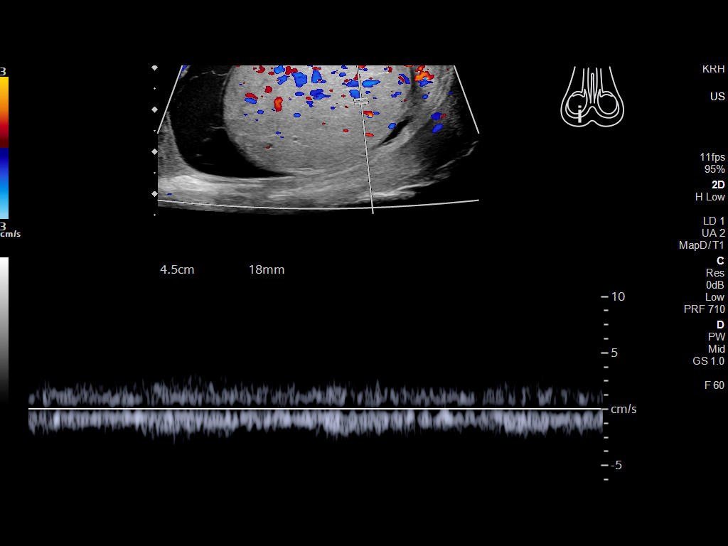
[im 20/59]
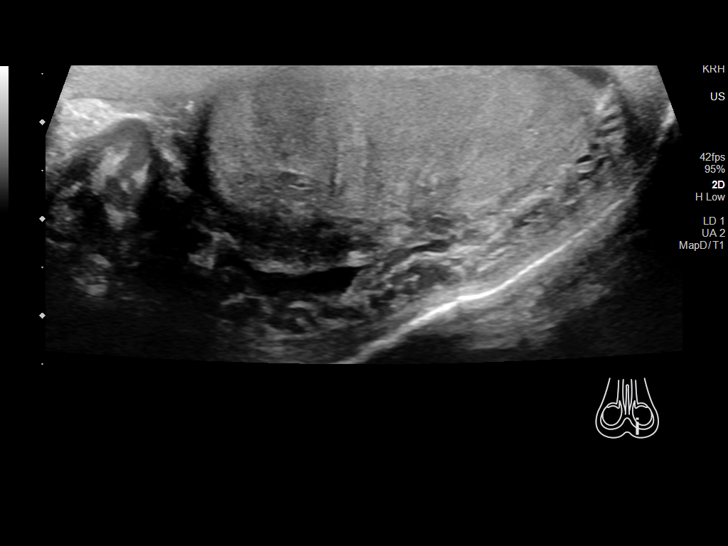
[im 25/59]
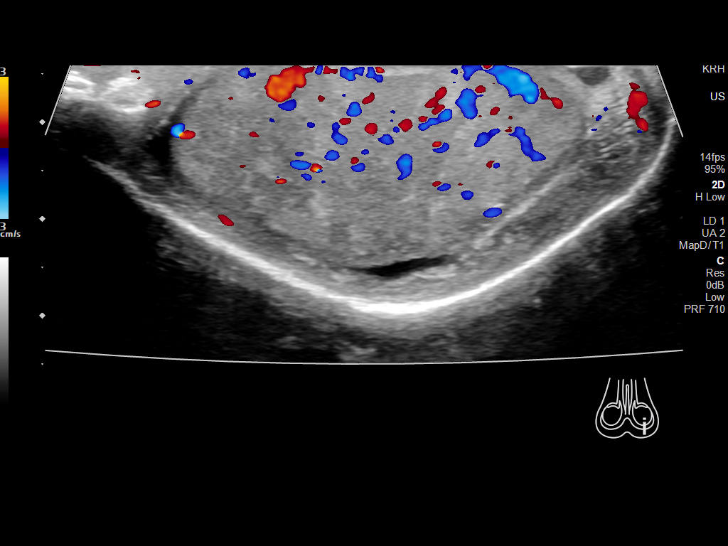
[im 30/59]
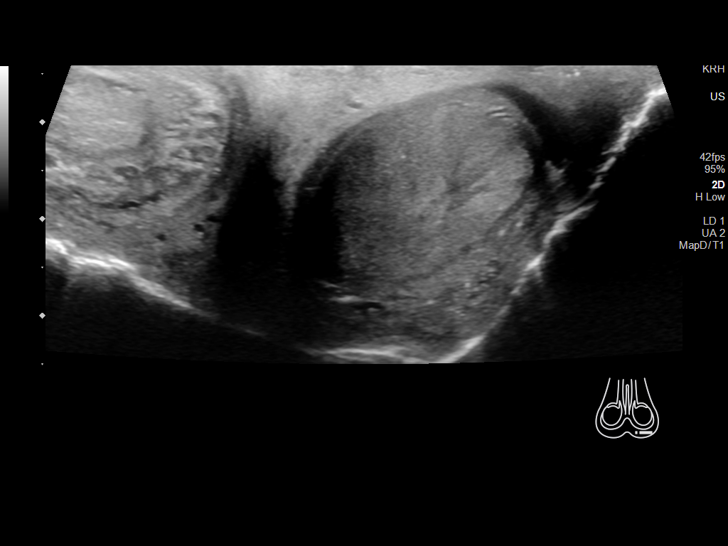
[im 34/59]
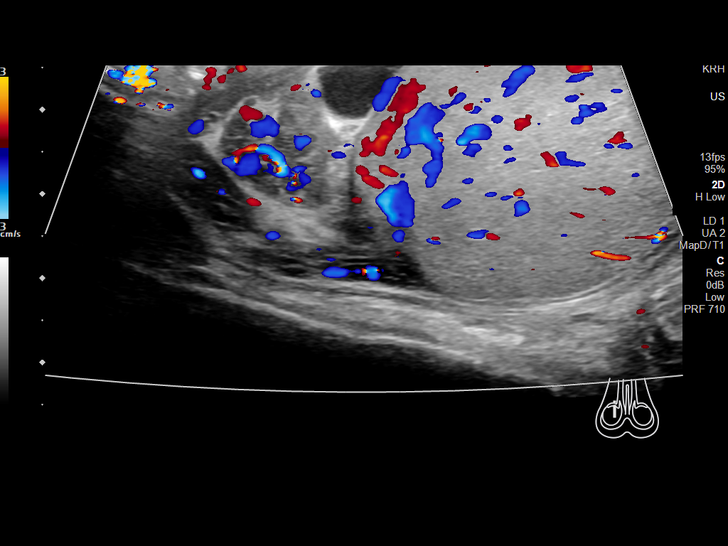
[im 39/59]
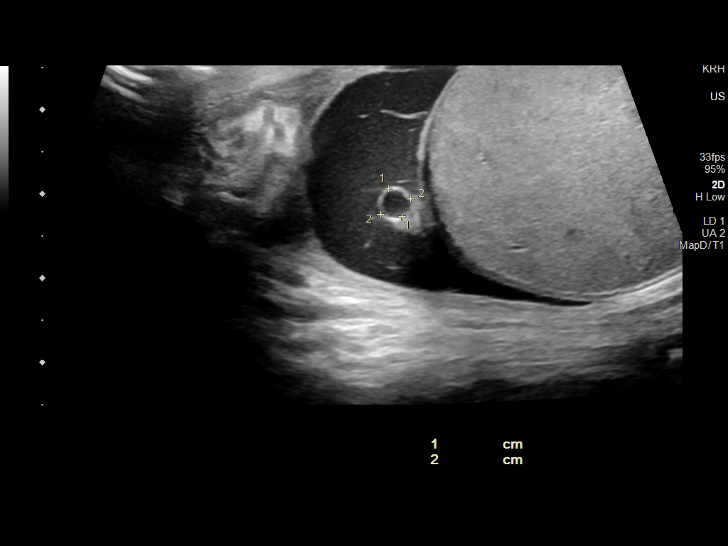
[im 44/59]
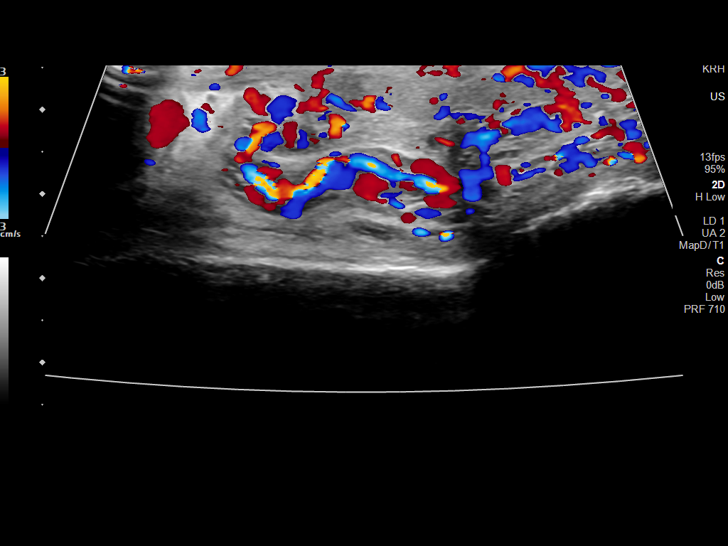
[im 49/59]
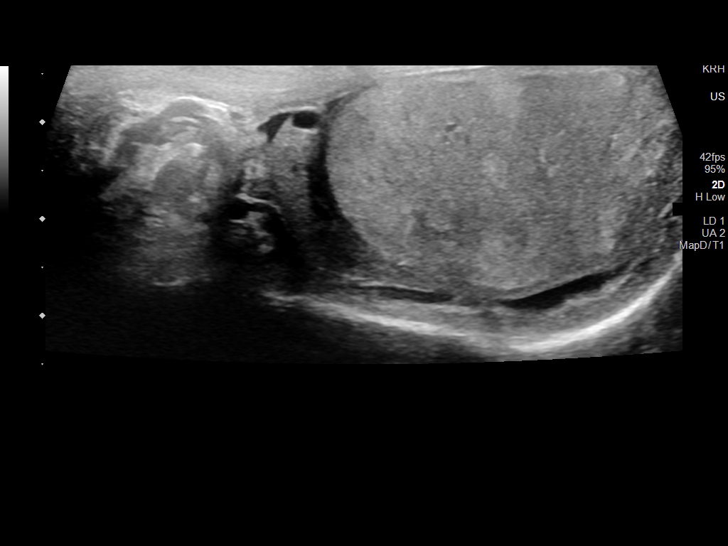
[im 54/59]
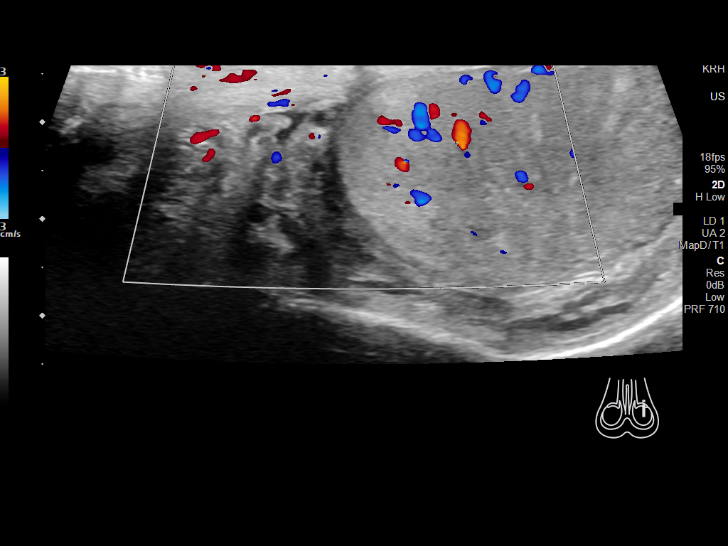
[im 59/59]
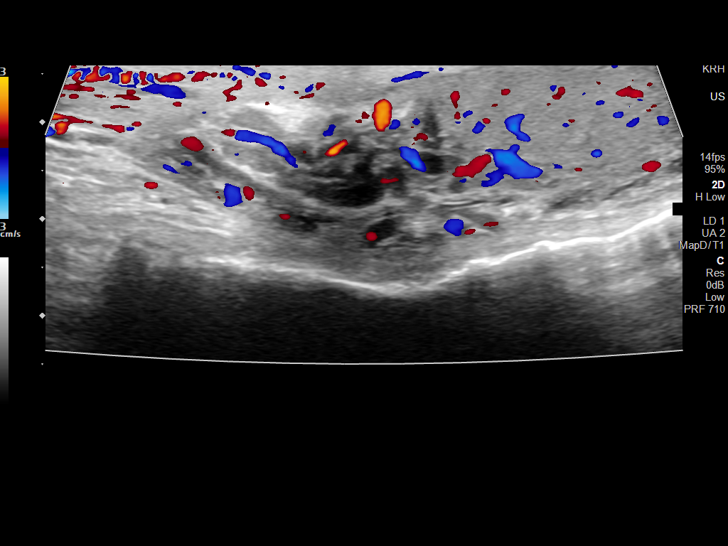

[13 of 25 positions shown; findings below may reference images not displayed]

FINDINGS: Right testicle

Measurements: 4.4 x 2.9 x 3.6. No mass or microlithiasis visualized.
Diffusely increased vascularity.

Left testicle

Measurements: 4.6 x 2.3 x 2.6. Diffusely heterogeneous and mildly
hypoechoic, a new finding. No focal mass or microlithiasis
visualized.

Right epididymis: Simple 4 mm epididymal head cyst. Otherwise normal
in size and appearance.

Left epididymis: Simple 4 mm epididymal head cysts. Otherwise normal
in size and appearance.

Hydrocele: Moderate right-sided hydrocele is present containing
septations and internal debris.

Varicocele:  None visualized.

Pulsed Doppler interrogation of both testes demonstrates normal low
resistance arterial and venous waveforms bilaterally.
IMPRESSION: 1. Findings concerning for right-sided orchitis.
2. Moderate-sized complex right hydrocele.
3. Left testicle is diffusely heterogeneous with areas of
hypoechogenicity. Findings may be related to prior infection.

## 2022-01-27 IMAGING — CT CT ABD-PELV W/ CM
2 of 5 series · 15 of 46 positions shown, 17 images · IV contrast (APPLIED)
Comparison: CT examination dated [DATE]

CLINICAL DATA: Abdominal pain, acute nonlocalized. Groin pain since
yesterday. Change changes extending

EXAM:
CT ABDOMEN AND PELVIS WITH CONTRAST
TECHNIQUE: Multidetector CT imaging of the abdomen and pelvis was performed
using the standard protocol following bolus administration of
intravenous contrast.

[Series 2: abd pel w · axial · 0.63mm/px · z∈[+473,+913]mm · 12 of 98 slices shown, 14 images]
[im 5/98  soft-tissue]
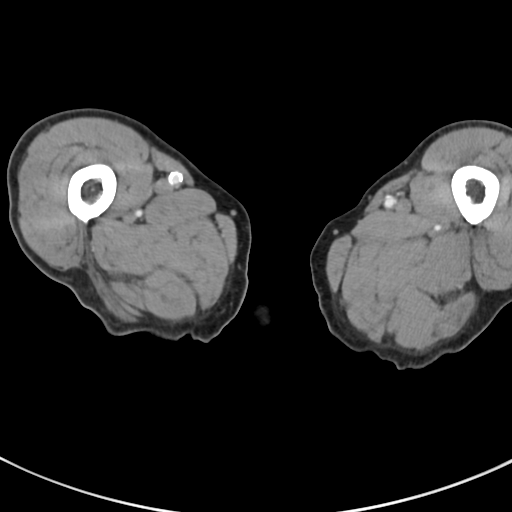
[im 5/98  bone]
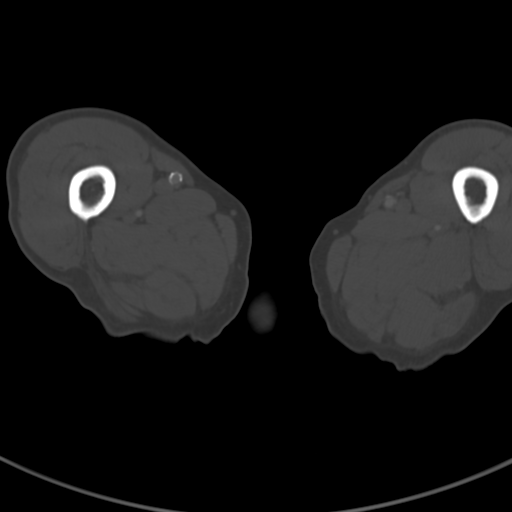
[im 15/98  soft-tissue]
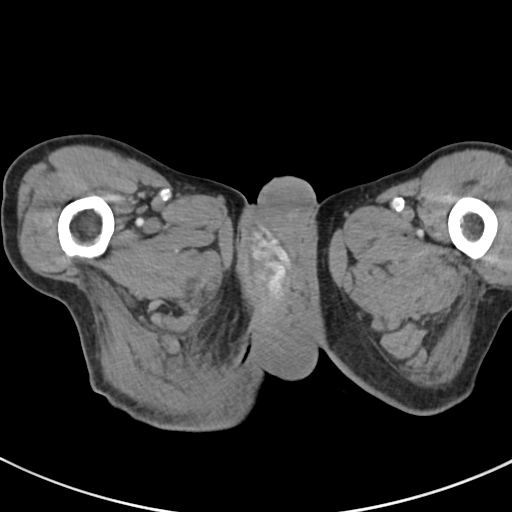
[im 20/98  soft-tissue]
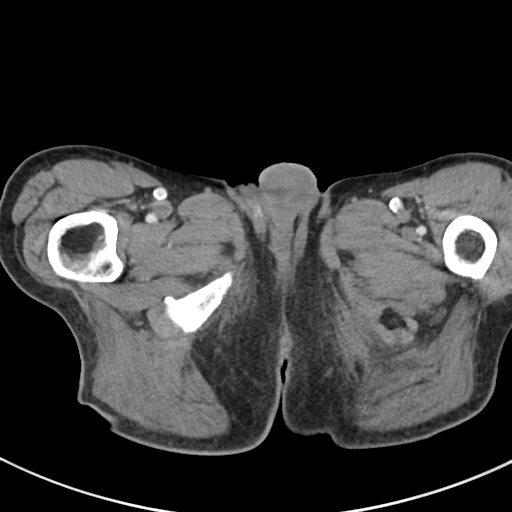
[im 30/98  soft-tissue]
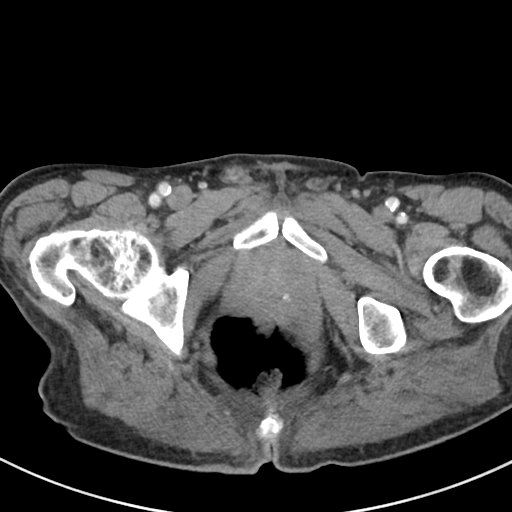
[im 39/98  soft-tissue]
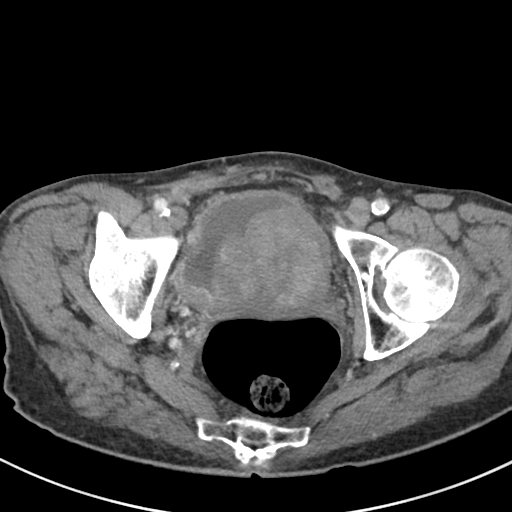
[im 44/98  soft-tissue]
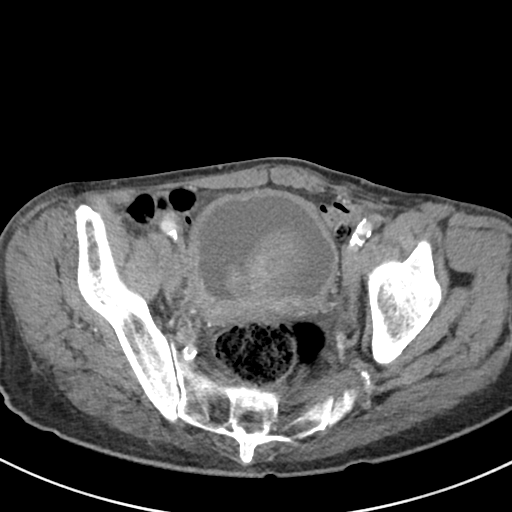
[im 54/98  soft-tissue]
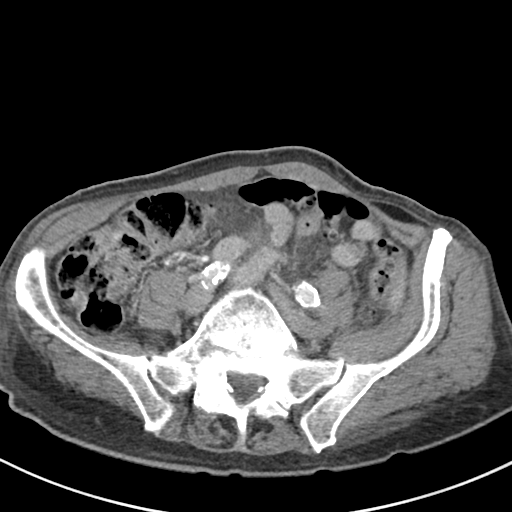
[im 59/98  soft-tissue]
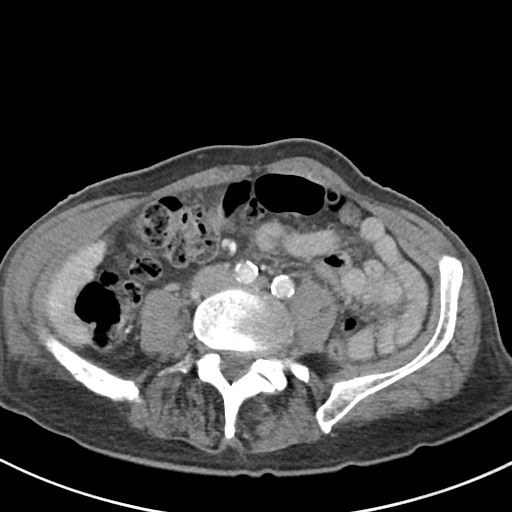
[im 68/98  soft-tissue]
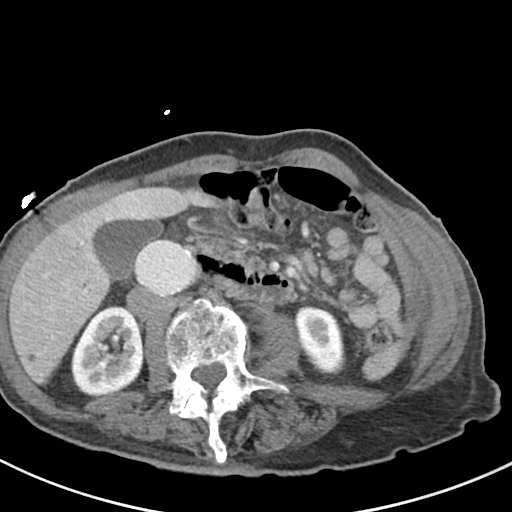
[im 68/98  bone]
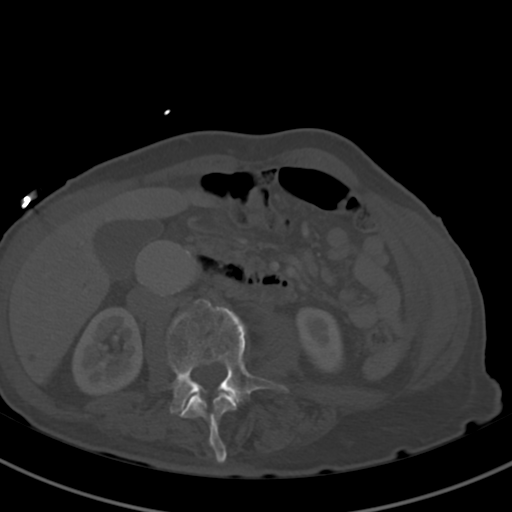
[im 78/98  soft-tissue]
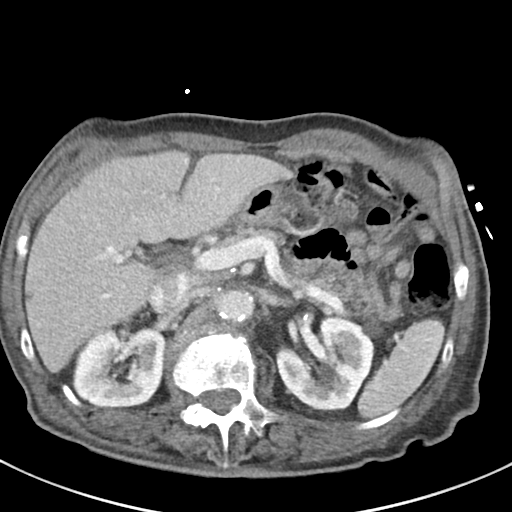
[im 83/98  soft-tissue]
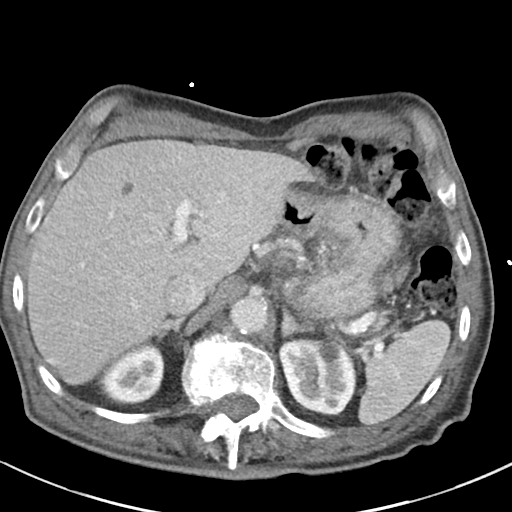
[im 93/98  soft-tissue]
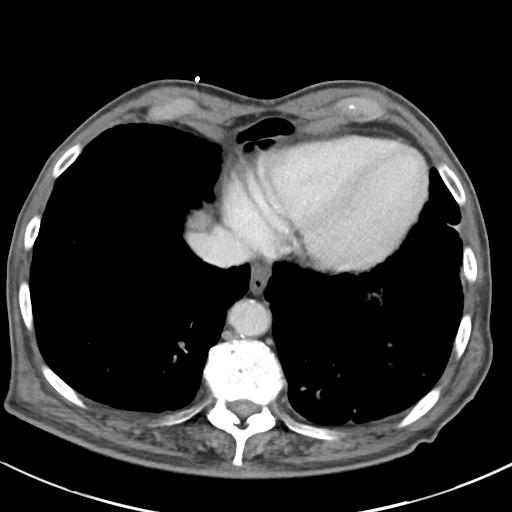

[Series 5: coronal · coronal · 0.69mm/px · 3 of 86 slices shown]
[im 29/86  soft-tissue]
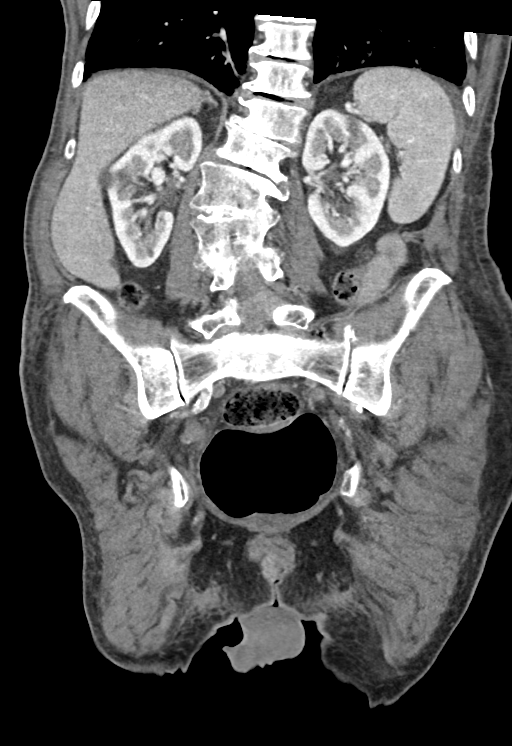
[im 38/86  soft-tissue]
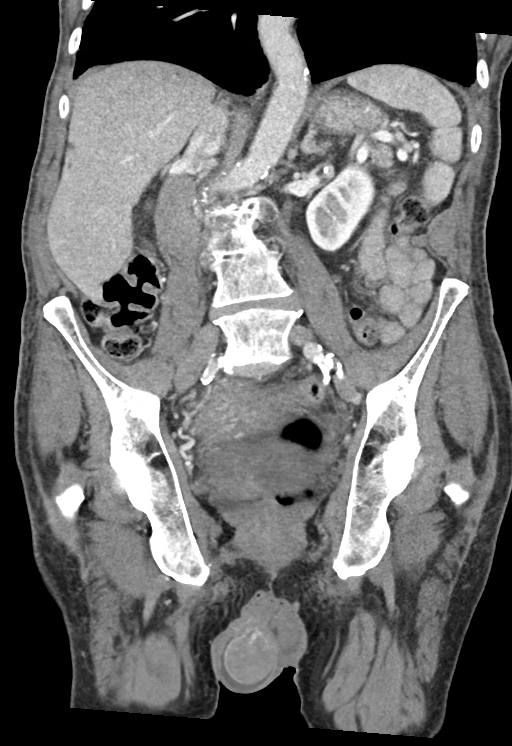
[im 48/86  soft-tissue]
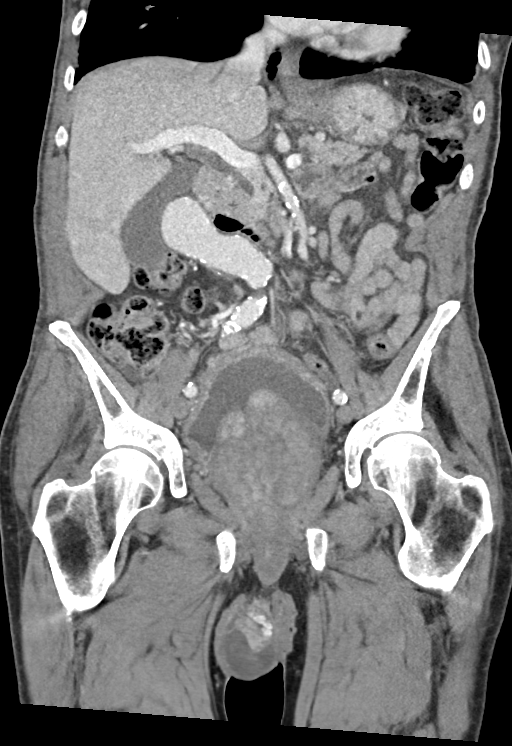

[15 of 46 positions shown; findings below may reference images not displayed]

RADIATION DOSE REDUCTION: This exam was performed according to the
departmental dose-optimization program which includes automated
exposure control, adjustment of the mA and/or kV according to
patient size and/or use of iterative reconstruction technique.

CONTRAST:  100mL OMNIPAQUE IOHEXOL 300 MG/ML  SOLN
FINDINGS: Lower chest: Interlobular septal thickening representing mild
fibrotic changes of lung bases.

Hepatobiliary: No focal liver abnormality is seen. No gallstones,
gallbladder wall thickening, or biliary dilatation.

Pancreas: Unremarkable. No pancreatic ductal dilatation or
surrounding inflammatory changes.

Spleen: Normal in size without focal abnormality.

Adrenals/Urinary Tract: Adrenal glands are unremarkable. Bilateral
upper pole renal cysts. No evidence of nephrolithiasis or
hydronephrosis. There is marked thickening of the urinary bladder
wall.

Stomach/Bowel: Stomach is within normal limits. Appendix appears
normal. No evidence of bowel wall thickening, distention, or
inflammatory changes.

Vascular/Lymphatic: Advanced aortic atherosclerosis calcification
with a tortuous course. Focal dilatation of the infrarenal abdominal
aorta measuring up to 3.4 cm. No enlarged abdominal or pelvic lymph
nodes.

Reproductive: Prostate is markedly enlarged measuring at least 6.0 x
6.4 cm which protrudes into the base of the urinary bladder.

Other: There is increased vascularity of the right testis.

Musculoskeletal: Advanced multilevel degenerative disc disease of
the lumbar spine with dextroscoliosis centered at L2-L3 disc space.
IMPRESSION: 1. Enhancement of the right testis and epididymis, which may
represent orchitis as seen on prior sonogram.

2. Markedly enlarged prostate measuring at least 6.0 x 6.4 cm which
protrudes into the base of the bladder with marked urinary bladder
wall thickening concerning for chronic outlet obstruction.

3. No evidence of hydronephrosis or ureteral calculus. Bilateral
simple renal cysts.

4. Bowel loops are normal in caliber. No evidence of colitis or
diverticulitis.

5. Advanced atherosclerotic disease of abdominal aorta and branch
vessels. Focal ectatic infrarenal abdominal aorta measuring up to
3.4 cm follow-up with ultrasound examination in 3 years is
recommended.

6. Advanced multilevel degenerative disease of the thoracolumbar
spine with dextroscoliosis. No acute osseous abnormality.

## 2022-01-27 MED ORDER — MORPHINE SULFATE (PF) 4 MG/ML IV SOLN
4.0000 mg | Freq: Once | INTRAVENOUS | Status: AC
  Administered 2022-01-27: 4 mg via INTRAVENOUS
  Filled 2022-01-27: qty 1

## 2022-01-27 MED ORDER — LACTATED RINGERS IV SOLN: INTRAVENOUS | Status: DC

## 2022-01-27 MED ORDER — LEVOFLOXACIN 500 MG PO TABS
500.0000 mg | ORAL_TABLET | Freq: Every day | ORAL | 0 refills | Status: DC
  Filled 2022-01-27: qty 10, 10d supply, fill #0

## 2022-01-27 MED ORDER — IOHEXOL 300 MG/ML  SOLN
100.0000 mL | Freq: Once | INTRAMUSCULAR | Status: AC | PRN
  Administered 2022-01-27: 100 mL via INTRAVENOUS

## 2022-01-27 NOTE — ED Triage Notes (Signed)
Pt endorses groin pain since yesterday. States his whole lower body is hurting and having trouble walking. Denies any falls. ?

## 2022-01-27 NOTE — ED Provider Notes (Signed)
?[facility] ?Provider Note ? ? ?CSN: [id number] ?Arrival date & time: 01/27/22  1305 ? ?  ? ?History ? ?Chief Complaint  ?Patient presents with  ? Groin Pain  ? ? ?Kenneth Woods is a 88 y.o. male. ? ?88 year old male presents with right groin pain x1 day.  Patient describes sharp stabbing pain to his right testicle is radiates up into his right groin.  Denies any urinary symptoms.  No trauma history.  No fever or chills.  Pain worse with movement better with remaining still.  No treatment use prior to arrival ? ? ?  ? ?Home Medications ?Prior to Admission medications   ?Medication Sig Start Date End Date Taking? Authorizing Provider  ?albuterol (PROVENTIL HFA;VENTOLIN HFA) 108 (90 Base) MCG/ACT inhaler Inhale 2 puffs into the lungs every 6 (six) hours as needed for wheezing or shortness of breath. 12/13/15   Dhungel, Flonnie Overman, MD  ?amLODipine (NORVASC) 5 MG tablet TAKE 1 TABLET BY MOUTH EVERY DAY 11/15/15   Dena Billet B, PA-C  ?ciprofloxacin (CIPRO) 250 MG tablet Take 1 tablet (250 mg total) by mouth every 12 (twelve) hours. 08/21/21   Dorie Rank, MD  ?finasteride (PROSCAR) 5 MG tablet TAKE 1 TABLET (5 MG TOTAL) BY MOUTH DAILY. 11/15/15   Orlena Sheldon, PA-C  ?guaiFENesin-dextromethorphan (ROBITUSSIN DM) 100-10 MG/5ML syrup Take 5 mLs by mouth every 4 (four) hours as needed for cough. 12/13/15   Dhungel, Flonnie Overman, MD  ?lisinopril (PRINIVIL,ZESTRIL) 20 MG tablet TAKE 1 TABLET BY MOUTH EVERY DAY 09/29/15   Orlena Sheldon, PA-C  ?nitrofurantoin, macrocrystal-monohydrate, (MACROBID) 100 MG capsule Take 1 capsule (100 mg total) by mouth 2 (two) times daily. 01/08/21   Sherwood Gambler, MD  ?OVER THE COUNTER MEDICATION Take 2 capsules by mouth 2 (two) times daily. PROSTA-STRONG (saw palmetto,lycopene,pumpkin seed extract)    [provider]  ?tamsulosin (FLOMAX) 0.4 MG CAPS capsule TAKE ONE CAPSULE BY MOUTH EVERY DAY AFTER SUPPER 06/18/15   Dena Billet B, PA-C  ?tamsulosin (FLOMAX) 0.4 MG CAPS  capsule Take 1 capsule (0.4 mg total) by mouth daily. 08/28/20   Palumbo, April, MD  ?tamsulosin (FLOMAX) 0.4 MG CAPS capsule Take 1 capsule (0.4 mg total) by mouth daily. 12/22/20   Palumbo, April, MD  ?   ? ?Allergies    ?Penicillins   ? ?Review of Systems   ?Review of Systems  ?All other systems reviewed and are negative. ? ?Physical Exam ?Updated Vital Signs ?BP (!) 107/39   Pulse (!) 58   Temp 98 ?F (36.7 ?C) (Oral)   Resp (!) 24   Ht 1.753 m (5\' 9" )   Wt 63.5 kg   SpO2 94%   BMI 20.67 kg/m?  ?Physical Exam ?Vitals and nursing note reviewed.  ?Constitutional:   ?   General: He is not in acute distress. ?   Appearance: Normal appearance. He is well-developed. He is not toxic-appearing.  ?HENT:  ?   Head: Normocephalic and atraumatic.  ?Eyes:  ?   General: Lids are normal.  ?   Conjunctiva/sclera: Conjunctivae normal.  ?   Pupils: Pupils are equal, round, and reactive to light.  ?Neck:  ?   Thyroid: No thyroid mass.  ?   Trachea: No tracheal deviation.  ?Cardiovascular:  ?   Rate and Rhythm: Normal rate and regular rhythm.  ?   Heart sounds: Normal heart sounds. No murmur heard. ?  No gallop.  ?Pulmonary:  ?   Effort: Pulmonary effort is normal. No respiratory  distress.  ?   Breath sounds: Normal breath sounds. No stridor. No decreased breath sounds, wheezing, rhonchi or rales.  ?Abdominal:  ?   General: There is no distension.  ?   Palpations: Abdomen is soft.  ?   Tenderness: There is no abdominal tenderness. There is no rebound.  ?Genitourinary: ?   Testes:     ?   Right: Tenderness and swelling present.  ?   Epididymis:  ?   Right: Enlarged. Tenderness present.  ?   Comments: Right testicular edema appreciated with tenderness to palpation.  Some tenderness in the right inguinal canal as well. ?Musculoskeletal:     ?   General: No tenderness. Normal range of motion.  ?   Cervical back: Normal range of motion and neck supple.  ?Skin: ?   General: Skin is warm and dry.  ?   Findings: No abrasion or rash.   ?Neurological:  ?   Mental Status: He is alert and oriented to person, place, and time. Mental status is at baseline.  ?   GCS: GCS eye subscore is 4. GCS verbal subscore is 5. GCS motor subscore is 6.  ?   Cranial Nerves: No cranial nerve deficit.  ?   Sensory: No sensory deficit.  ?   Motor: Motor function is intact.  ?Psychiatric:     ?   Attention and Perception: Attention normal.     ?   Speech: Speech normal.     ?   Behavior: Behavior normal.  ? ? ?ED Results / Procedures / Treatments   ?Labs ?(all labs ordered are listed, but only abnormal results are displayed) ?Labs Reviewed  ?CBC - Abnormal; Notable for the following components:  ?    Result Value  ? WBC 16.3 (*)   ? All other components within normal limits  ?BASIC METABOLIC PANEL - Abnormal; Notable for the following components:  ? Glucose, Bld 119 (*)   ? All other components within normal limits  ?URINALYSIS, ROUTINE W REFLEX MICROSCOPIC  ? ? ?EKG ?None ? ?Radiology ?No results found. ? ?Procedures ?Procedures  ? ? ?Medications Ordered in ED ?Medications  ?lactated ringers infusion (has no administration in time range)  ?morphine (PF) 4 MG/ML injection 4 mg (has no administration in time range)  ? ? ?ED Course/ Medical Decision Making/ A&P ?  ?                        ?Medical Decision Making ?Amount and/or Complexity of Data Reviewed ?Labs: ordered. ?Radiology: ordered. ? ?Risk ?Prescription drug management. ? ? ?Patient presented with right testicular pain and concern for possible testicular torsion.  Had ultrasound performed which showed orchitis.  That finding was per my review and interpretation.  Patient was tenderness right lower quadrant did have leukocytosis on his CBC.  He then had an abdominal CT which per my review and interpretation did not show any evidence of appendicitis.  Patient's urinalysis consistent with infection.  Will place patient on antibiotics and he will follow-up with his urologist ? ? ? ? ? ? ? ?Final Clinical  Impression(s) / ED Diagnoses ?Final diagnoses:  ?None  ? ? ?Rx / DC Orders ?ED Discharge Orders   ? ? None  ? ?  ? ? ?  ?Lacretia Leigh, MD ?01/27/22 1844 ? ?

## 2022-01-28 ENCOUNTER — Telehealth (HOSPITAL_BASED_OUTPATIENT_CLINIC_OR_DEPARTMENT_OTHER): Payer: Self-pay | Admitting: *Deleted

## 2022-01-28 MED ORDER — LEVOFLOXACIN 500 MG PO TABS: 500.0000 mg | ORAL_TABLET | Freq: Every day | ORAL | 0 refills | Status: DC

## 2022-01-30 ENCOUNTER — Other Ambulatory Visit (HOSPITAL_BASED_OUTPATIENT_CLINIC_OR_DEPARTMENT_OTHER): Payer: Self-pay

## 2022-02-09 DIAGNOSIS — R3914 Feeling of incomplete bladder emptying: Secondary | ICD-10-CM | POA: Diagnosis not present

## 2022-02-09 DIAGNOSIS — N453 Epididymo-orchitis: Secondary | ICD-10-CM | POA: Diagnosis not present

## 2022-02-16 ENCOUNTER — Emergency Department (HOSPITAL_BASED_OUTPATIENT_CLINIC_OR_DEPARTMENT_OTHER): Payer: Medicare Other

## 2022-02-16 ENCOUNTER — Other Ambulatory Visit: Payer: Self-pay

## 2022-02-16 ENCOUNTER — Encounter (HOSPITAL_BASED_OUTPATIENT_CLINIC_OR_DEPARTMENT_OTHER): Payer: Self-pay | Admitting: Emergency Medicine

## 2022-02-16 ENCOUNTER — Other Ambulatory Visit (HOSPITAL_BASED_OUTPATIENT_CLINIC_OR_DEPARTMENT_OTHER): Payer: Self-pay

## 2022-02-16 ENCOUNTER — Emergency Department (HOSPITAL_BASED_OUTPATIENT_CLINIC_OR_DEPARTMENT_OTHER)
Admission: EM | Admit: 2022-02-16 | Discharge: 2022-02-16 | Disposition: A | Discharge status: Home / Self Care | Payer: Medicare Other | Attending: Emergency Medicine | Admitting: Emergency Medicine

## 2022-02-16 DIAGNOSIS — N452 Orchitis: Secondary | ICD-10-CM | POA: Insufficient documentation

## 2022-02-16 DIAGNOSIS — N50811 Right testicular pain: Secondary | ICD-10-CM | POA: Diagnosis present

## 2022-02-16 DIAGNOSIS — I10 Essential (primary) hypertension: Secondary | ICD-10-CM | POA: Insufficient documentation

## 2022-02-16 DIAGNOSIS — Z79899 Other long term (current) drug therapy: Secondary | ICD-10-CM | POA: Insufficient documentation

## 2022-02-16 LAB — CBC WITH DIFFERENTIAL/PLATELET
Abs Immature Granulocytes: 0.02 10*3/uL (ref 0.00–0.07)
Basophils Absolute: 0 10*3/uL (ref 0.0–0.1)
Basophils Relative: 0 %
Eosinophils Absolute: 0 10*3/uL (ref 0.0–0.5)
Eosinophils Relative: 1 %
HCT: 43.3 % (ref 39.0–52.0)
Hemoglobin: 14 g/dL (ref 13.0–17.0)
Immature Granulocytes: 0 %
Lymphocytes Relative: 27 %
Lymphs Abs: 1.3 10*3/uL (ref 0.7–4.0)
MCH: 29 pg (ref 26.0–34.0)
MCHC: 32.3 g/dL (ref 30.0–36.0)
MCV: 89.8 fL (ref 80.0–100.0)
Monocytes Absolute: 0.6 10*3/uL (ref 0.1–1.0)
Monocytes Relative: 12 %
Neutro Abs: 2.9 10*3/uL (ref 1.7–7.7)
Neutrophils Relative %: 60 %
Platelets: 174 10*3/uL (ref 150–400)
RBC: 4.82 MIL/uL (ref 4.22–5.81)
RDW: 14 % (ref 11.5–15.5)
WBC: 4.9 10*3/uL (ref 4.0–10.5)
nRBC: 0 % (ref 0.0–0.2)

## 2022-02-16 LAB — BASIC METABOLIC PANEL
Anion gap: 7 (ref 5–15)
BUN: 14 mg/dL (ref 8–23)
CO2: 28 mmol/L (ref 22–32)
Calcium: 9.3 mg/dL (ref 8.9–10.3)
Chloride: 105 mmol/L (ref 98–111)
Creatinine, Ser: 0.73 mg/dL (ref 0.61–1.24)
GFR, Estimated: >60 mL/min (ref >60)
Glucose, Bld: 100 mg/dL — ABNORMAL HIGH (ref 70–99)
Potassium: 4 mmol/L (ref 3.5–5.1)
Sodium: 140 mmol/L (ref 135–145)

## 2022-02-16 LAB — URINALYSIS, ROUTINE W REFLEX MICROSCOPIC
Bilirubin Urine: NEGATIVE
Glucose, UA: NEGATIVE mg/dL
Hgb urine dipstick: NEGATIVE
Ketones, ur: NEGATIVE mg/dL
Leukocytes,Ua: NEGATIVE
Nitrite: NEGATIVE
Protein, ur: NEGATIVE mg/dL
Specific Gravity, Urine: 1.017 (ref 1.005–1.030)
pH: 7 (ref 5.0–8.0)

## 2022-02-16 IMAGING — US US SCROTUM W/ DOPPLER COMPLETE
1 series · 16 of 25 positions shown · non-contrast
Comparison: Comparison made with imaging from [DATE]
COMPARISON: Comparison made with imaging from [DATE]

Addendum:
CLINICAL DATA: Testicular pain with worsening since prior imaging.

EXAM:
SCROTAL ULTRASOUND
DOPPLER ULTRASOUND OF THE TESTICLES
TECHNIQUE: Complete ultrasound examination of the testicles, epididymis, and
other scrotal structures was performed. Color and spectral Doppler
ultrasound were also utilized to evaluate blood flow to the
testicles.

[Series 1: us scrotum w/doppler · 16 of 68 slices shown]
[im 1/68]
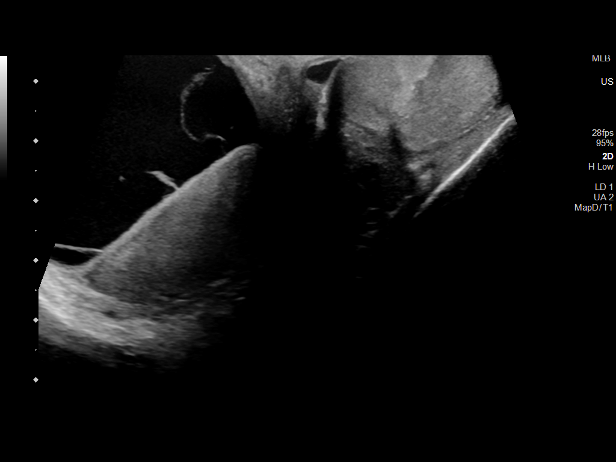
[im 6/68]
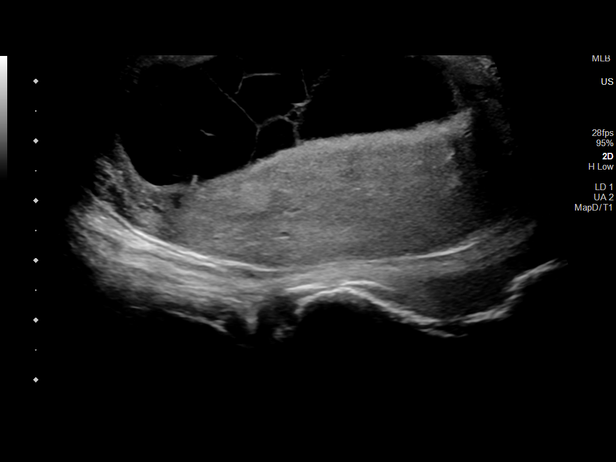
[im 9/68]
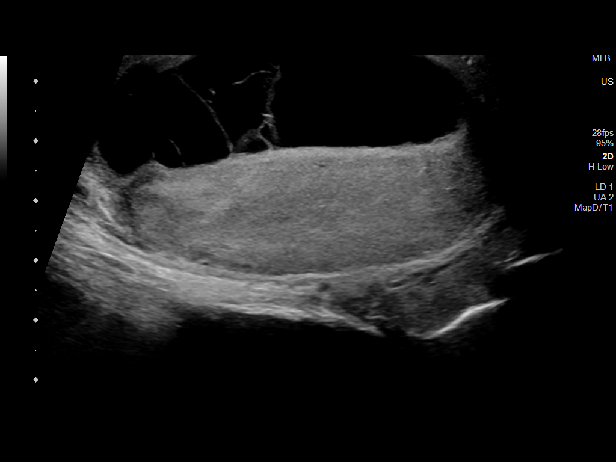
[im 14/68]
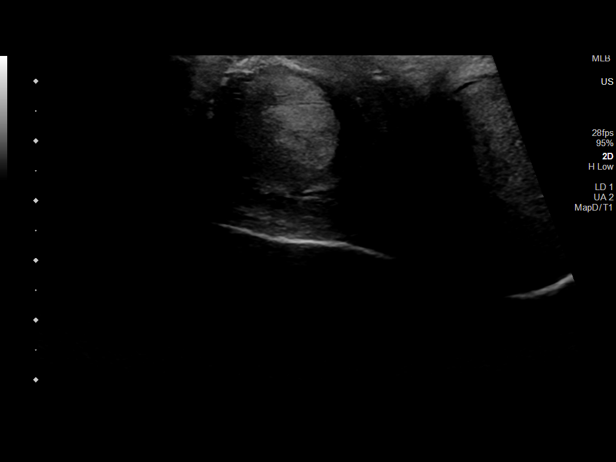
[im 20/68]
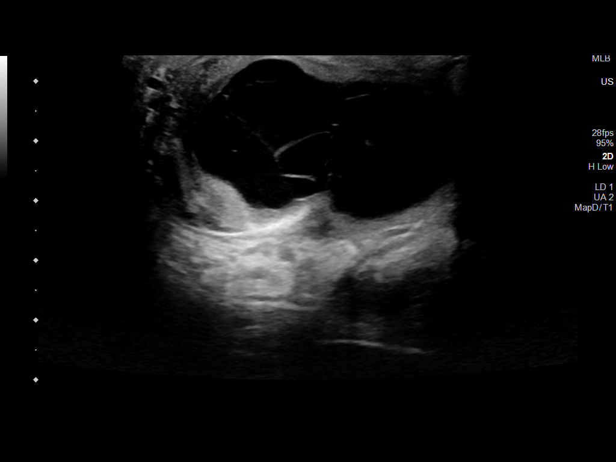
[im 23/68]
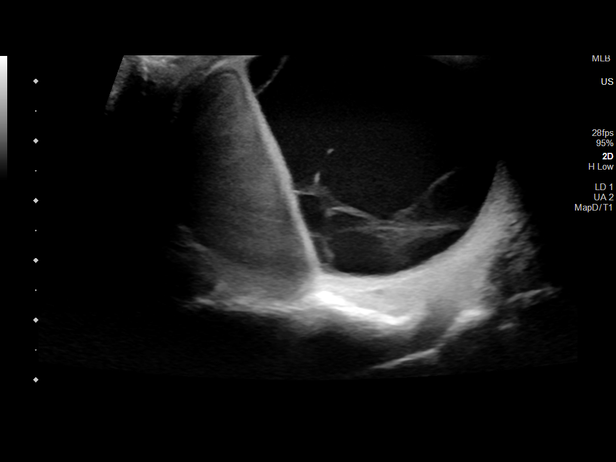
[im 28/68]
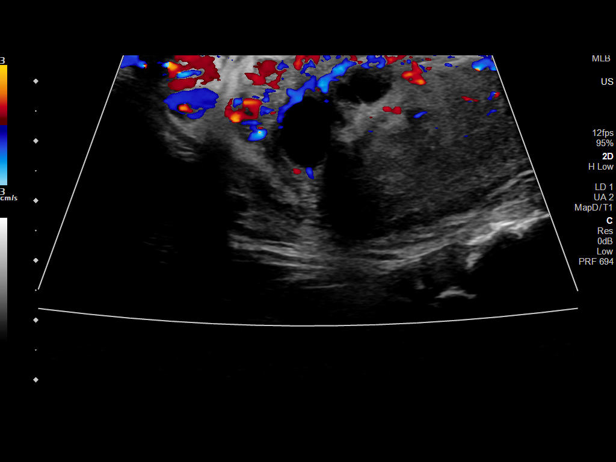
[im 31/68]
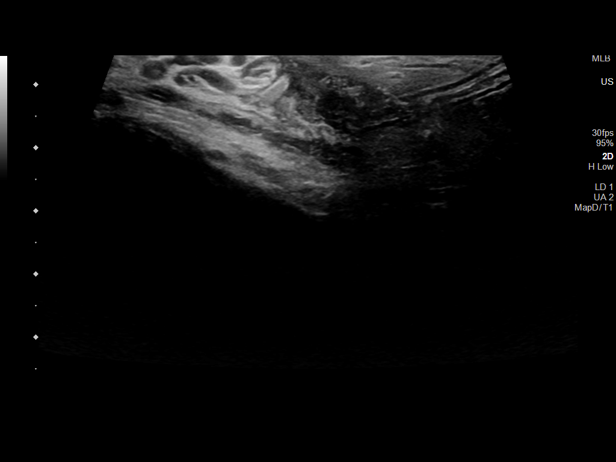
[im 37/68]
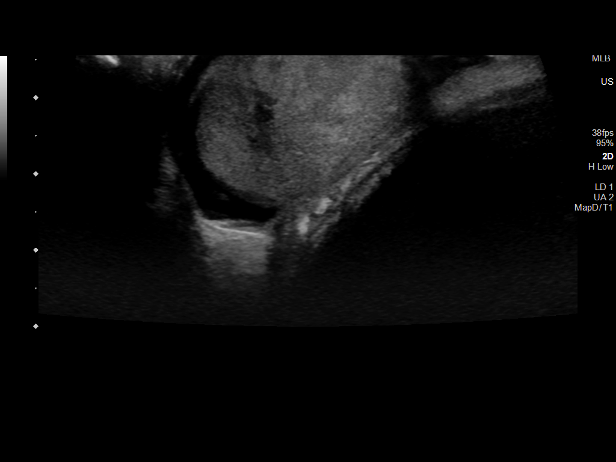
[im 40/68]
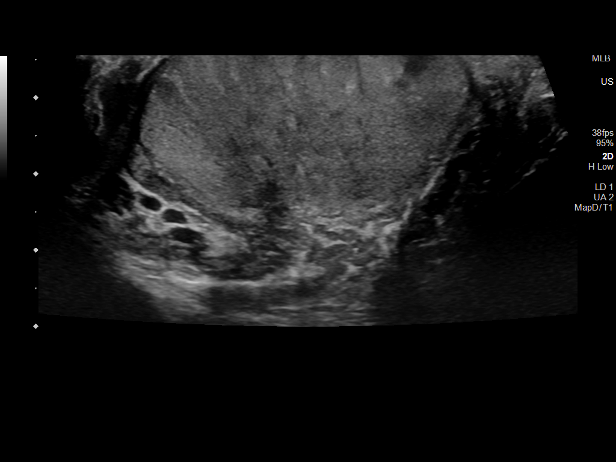
[im 45/68]
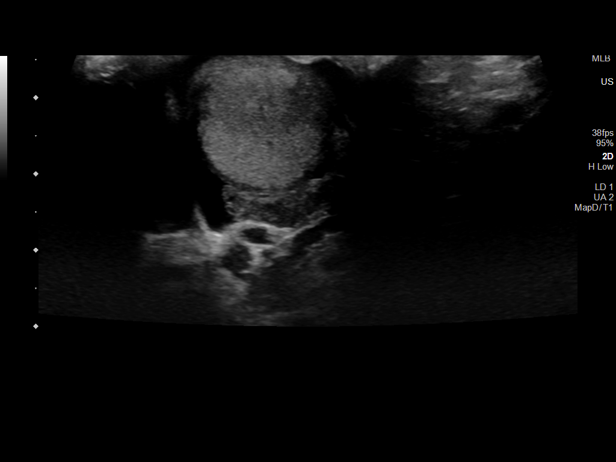
[im 48/68]
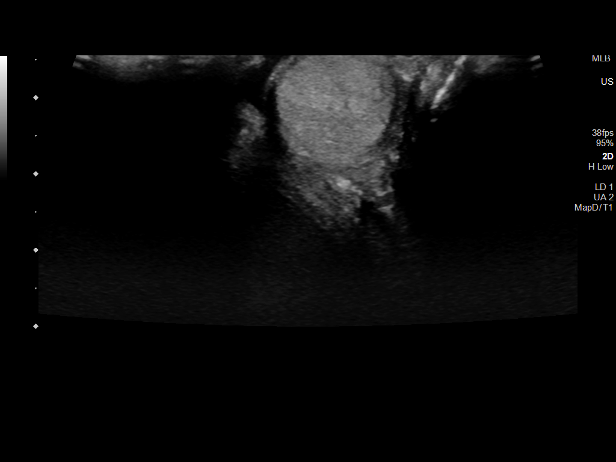
[im 54/68]
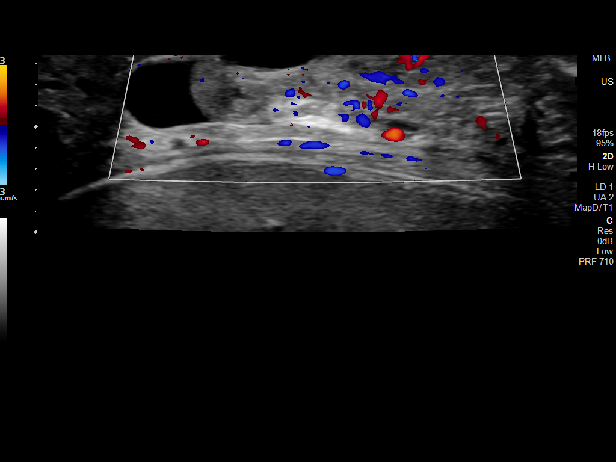
[im 59/68]
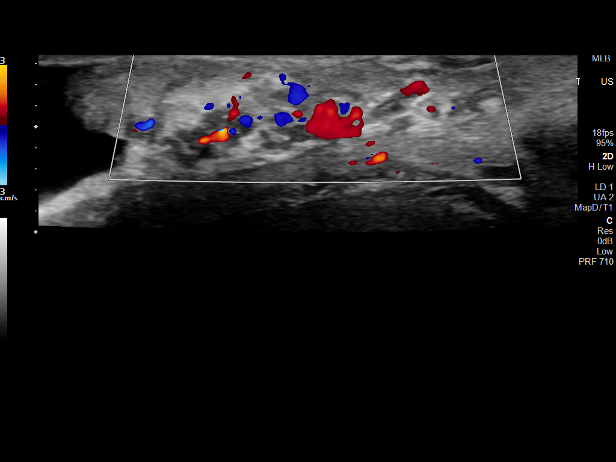
[im 62/68]
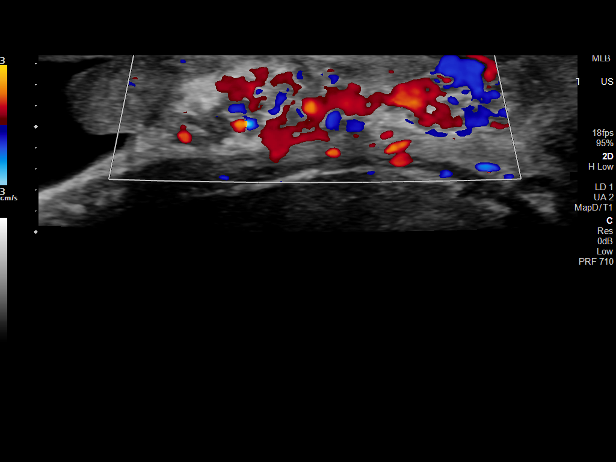
[im 68/68]
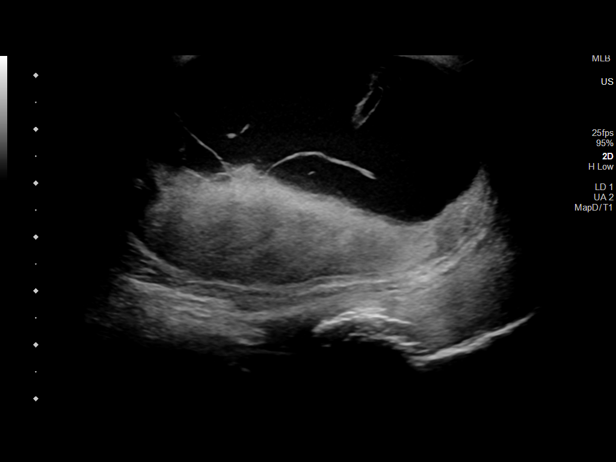

[16 of 25 positions shown; findings below may reference images not displayed]

FINDINGS: Right testicle

Measurements: 6.1 x 2.0 x 1.9 cm. Heterogeneous testis which is
elongated and suffers from mass effect from an adjacent complex
hydrocele such that the border along the margin with the hydrocele
and the testis is straight implying mass effect and altering the
normal shape of the RIGHT testis. Venous and arterial flow can be
documented within the testis though with tardus parvus waveform of
the testis exhibited and some increased resistance as compared to
previous imaging within the testis.

Left testicle

Measurements: 4.7 x 2.3 x 2.6 cm. Heterogeneity of the LEFT testis
is very similar to the prior exam. No focal mass lesion.

Right epididymis:  Not well visualized.

Left epididymis: Potential tiny cyst in the LEFT epididymis.
Heterogeneous appearance of the LEFT epididymis.

Hydrocele: Increase in size and resultant mass-effect upon the RIGHT
testis. Hydroceles shows septations and some internal debris.
Scrotal collection measuring 7.3 x 4.4 x 5.1 cm, markedly increased
in size compared to previous imaging.

Varicocele:  None visualized.

Pulsed Doppler interrogation of both testes demonstrates tardus
parvus waveform on the RIGHT with preserved venous and arterial flow
but with increased vascular resistance due to testicular
compression.

LEFT testis with normal low resistance arterial and venous
waveforms.
IMPRESSION: Findings of orchitis seen on the prior study have progressed with
marked enlargement of a complex RIGHT hydrocele, potentially pyocele
of the RIGHT scrotum which shows mass effect upon the RIGHT testis
such that there is a tardus parvus waveform and early vascular
compromise. Mass effect exhibited by flattening/elongation of the
testis and straightening of the boundary between the
testicular/scrotal collection interface. Urologic consultation is
suggested.

LEFT testis with heterogeneity favored to be related to prior
orchitis. Consider 6-8 week follow-up to ensure stability.

ADDENDUM:
Mass effect with resultant early vascular compromise/increased
vascular resistance involving the RIGHT testis secondary to
enlargement of RIGHT scrotal collection was discussed with the
provider as outlined below.

Critical Value/emergent results were called by telephone at the time
of interpretation on [DATE] at [DATE] to provider MYEISHA
, who verbally acknowledged these results.

*** End of Addendum ***
FINDINGS: Right testicle

Measurements: 6.1 x 2.0 x 1.9 cm. Heterogeneous testis which is
elongated and suffers from mass effect from an adjacent complex
hydrocele such that the border along the margin with the hydrocele
and the testis is straight implying mass effect and altering the
normal shape of the RIGHT testis. Venous and arterial flow can be
documented within the testis though with tardus parvus waveform of
the testis exhibited and some increased resistance as compared to
previous imaging within the testis.

Left testicle

Measurements: 4.7 x 2.3 x 2.6 cm. Heterogeneity of the LEFT testis
is very similar to the prior exam. No focal mass lesion.

Right epididymis:  Not well visualized.

Left epididymis: Potential tiny cyst in the LEFT epididymis.
Heterogeneous appearance of the LEFT epididymis.

Hydrocele: Increase in size and resultant mass-effect upon the RIGHT
testis. Hydroceles shows septations and some internal debris.
Scrotal collection measuring 7.3 x 4.4 x 5.1 cm, markedly increased
in size compared to previous imaging.

Varicocele:  None visualized.

Pulsed Doppler interrogation of both testes demonstrates tardus
parvus waveform on the RIGHT with preserved venous and arterial flow
but with increased vascular resistance due to testicular
compression.

LEFT testis with normal low resistance arterial and venous
waveforms.
IMPRESSION: Findings of orchitis seen on the prior study have progressed with
marked enlargement of a complex RIGHT hydrocele, potentially pyocele
of the RIGHT scrotum which shows mass effect upon the RIGHT testis
such that there is a tardus parvus waveform and early vascular
compromise. Mass effect exhibited by flattening/elongation of the
testis and straightening of the boundary between the
testicular/scrotal collection interface. Urologic consultation is
suggested.

LEFT testis with heterogeneity favored to be related to prior
orchitis. Consider 6-8 week follow-up to ensure stability.

## 2022-02-16 MED ORDER — HYDROCODONE-ACETAMINOPHEN 5-325 MG PO TABS
1.0000 | ORAL_TABLET | ORAL | 0 refills | Status: DC | PRN
  Filled 2022-02-16: qty 10, 2d supply, fill #0

## 2022-02-16 MED ORDER — ONDANSETRON HCL 4 MG/2ML IJ SOLN
4.0000 mg | Freq: Once | INTRAMUSCULAR | Status: AC
  Administered 2022-02-16: 4 mg via INTRAVENOUS
  Filled 2022-02-16: qty 2

## 2022-02-16 MED ORDER — MORPHINE SULFATE (PF) 4 MG/ML IV SOLN
4.0000 mg | Freq: Once | INTRAVENOUS | Status: AC
  Administered 2022-02-16: 4 mg via INTRAVENOUS
  Filled 2022-02-16: qty 1

## 2022-02-16 MED ORDER — SODIUM CHLORIDE 0.9 % IV SOLN
2.0000 g | Freq: Once | INTRAVENOUS | Status: AC
  Administered 2022-02-16: 2 g via INTRAVENOUS
  Filled 2022-02-16: qty 20

## 2022-02-16 MED ORDER — SULFAMETHOXAZOLE-TRIMETHOPRIM 800-160 MG PO TABS
1.0000 | ORAL_TABLET | Freq: Two times a day (BID) | ORAL | 0 refills | Status: DC
  Filled 2022-02-16: qty 20, 10d supply, fill #0

## 2022-02-16 NOTE — Discharge Instructions (Addendum)
If you develop a fever or your symptoms worsen, you need to go to the [facility] Emergency Room. ? ?The pain medication will cause constipation.  Make sure you eat extra fiber. ?

## 2022-02-16 NOTE — ED Provider Notes (Signed)
?[facility]PT ?Provider Note ? ? ?CSN: [id number] ?Arrival date & time: 02/16/22  0830 ? ?  ? ?History ? ?Chief Complaint  ?Patient presents with  ? Groin Swelling  ? ? ?Kenneth Woods is a 88 y.o. male. ? ?Pt is a 88 yo male with a pmhx significant for hyperlipidemia, htn, copd, bph, chronic back pain, and orchitis.  Pt was seen here on 4/14 for orchitis.  He was d/c with abx.  Pt said he took those.  He followed up with his urologist (Dr. Benancio Deeds) who put him on some more abx.  Pt and family do not know what they were.  Pt denies any fevers/chills.  He just has a lot of pain in his testicles.  I called pt's pharmacy.  They said pt was on Levaquin on 4/15 and it was prescribed again on 4/27. ? ? ?  ? ?Home Medications ?Prior to Admission medications   ?Medication Sig Start Date End Date Taking? Authorizing Provider  ?HYDROcodone-acetaminophen (NORCO/VICODIN) 5-325 MG tablet Take 1 tablet by mouth every 4 (four) hours as needed. 02/16/22  Yes Jacalyn Lefevre, MD  ?sulfamethoxazole-trimethoprim (BACTRIM DS) 800-160 MG tablet Take 1 tablet by mouth 2 (two) times daily for 10 days. 02/16/22 02/26/22 Yes Jacalyn Lefevre, MD  ?albuterol (PROVENTIL HFA;VENTOLIN HFA) 108 (90 Base) MCG/ACT inhaler Inhale 2 puffs into the lungs every 6 (six) hours as needed for wheezing or shortness of breath. 12/13/15   Dhungel, Theda Belfast, MD  ?amLODipine (NORVASC) 5 MG tablet TAKE 1 TABLET BY MOUTH EVERY DAY 11/15/15   Allayne Butcher B, PA-C  ?ciprofloxacin (CIPRO) 250 MG tablet Take 1 tablet (250 mg total) by mouth every 12 (twelve) hours. 08/21/21   Linwood Dibbles, MD  ?finasteride (PROSCAR) 5 MG tablet TAKE 1 TABLET (5 MG TOTAL) BY MOUTH DAILY. 11/15/15   Dorena Bodo, PA-C  ?guaiFENesin-dextromethorphan (ROBITUSSIN DM) 100-10 MG/5ML syrup Take 5 mLs by mouth every 4 (four) hours as needed for cough. 12/13/15   Dhungel, Theda Belfast, MD  ?levofloxacin (LEVAQUIN) 500 MG tablet Take 1 tablet (500 mg total) by mouth daily. 01/28/22    Lorre Nick, MD  ?lisinopril (PRINIVIL,ZESTRIL) 20 MG tablet TAKE 1 TABLET BY MOUTH EVERY DAY 09/29/15   Dorena Bodo, PA-C  ?nitrofurantoin, macrocrystal-monohydrate, (MACROBID) 100 MG capsule Take 1 capsule (100 mg total) by mouth 2 (two) times daily. 01/08/21   Pricilla Loveless, MD  ?OVER THE COUNTER MEDICATION Take 2 capsules by mouth 2 (two) times daily. PROSTA-STRONG (saw palmetto,lycopene,pumpkin seed extract)    [provider]  ?tamsulosin (FLOMAX) 0.4 MG CAPS capsule TAKE ONE CAPSULE BY MOUTH EVERY DAY AFTER SUPPER 06/18/15   Allayne Butcher B, PA-C  ?tamsulosin (FLOMAX) 0.4 MG CAPS capsule Take 1 capsule (0.4 mg total) by mouth daily. 08/28/20   Palumbo, April, MD  ?tamsulosin (FLOMAX) 0.4 MG CAPS capsule Take 1 capsule (0.4 mg total) by mouth daily. 12/22/20   Palumbo, April, MD  ?   ? ?Allergies    ?Penicillins   ? ?Review of Systems   ?Review of Systems  ?Genitourinary:  Positive for testicular pain.  ?All other systems reviewed and are negative. ? ?Physical Exam ?Updated Vital Signs ?BP (!) 107/52   Pulse (!) 50   Temp 98 ?F (36.7 ?C) (Oral)   Resp 16   SpO2 92%  ?Physical Exam ?Vitals and nursing note reviewed. Exam conducted with a chaperone present.  ?Constitutional:   ?   Appearance: Normal appearance.  ?HENT:  ?  Head: Normocephalic and atraumatic.  ?   Right Ear: External ear normal.  ?   Left Ear: External ear normal.  ?   Nose: Nose normal.  ?   Mouth/Throat:  ?   Mouth: Mucous membranes are moist.  ?   Pharynx: Oropharynx is clear.  ?Eyes:  ?   Extraocular Movements: Extraocular movements intact.  ?   Conjunctiva/sclera: Conjunctivae normal.  ?   Pupils: Pupils are equal, round, and reactive to light.  ?Cardiovascular:  ?   Rate and Rhythm: Normal rate and regular rhythm.  ?   Pulses: Normal pulses.  ?   Heart sounds: Normal heart sounds.  ?Pulmonary:  ?   Effort: Pulmonary effort is normal.  ?   Breath sounds: Normal breath sounds.  ?Abdominal:  ?   General: Abdomen is flat. Bowel  sounds are normal.  ?   Palpations: Abdomen is soft.  ?Genitourinary: ?   Penis: Normal.   ?   Testes:     ?   Right: Tenderness and swelling present.  ?Musculoskeletal:     ?   General: Normal range of motion.  ?   Cervical back: Normal range of motion and neck supple.  ?Skin: ?   General: Skin is warm.  ?   Capillary Refill: Capillary refill takes less than 2 seconds.  ?Neurological:  ?   General: No focal deficit present.  ?   Mental Status: He is alert and oriented to person, place, and time.  ?Psychiatric:     ?   Mood and Affect: Mood normal.     ?   Behavior: Behavior normal.  ? ? ?ED Results / Procedures / Treatments   ?Labs ?(all labs ordered are listed, but only abnormal results are displayed) ?Labs Reviewed  ?BASIC METABOLIC PANEL - Abnormal; Notable for the following components:  ?    Result Value  ? Glucose, Bld 100 (*)   ? All other components within normal limits  ?URINE CULTURE  ?CBC WITH DIFFERENTIAL/PLATELET  ?URINALYSIS, ROUTINE W REFLEX MICROSCOPIC  ? ? ?EKG ?None ? ?Radiology ?US SCROTUM W/DOPPLER ? ?Addendum Date: 02/16/2022   ?ADDENDUM REPORT: 02/16/2022 10:28 ADDENDUM: Mass effect with resultant early vascular compromise/increased vascular resistance involving the RIGHT testis secondary to enlargement of RIGHT scrotal collection was discussed with the provider as outlined below. Critical Value/emergent results were called by telephone at the time of interpretation on 02/16/2022 at 10:28 am to provider [facility] Lich[facility] , who verbally acknowledged these results. Electronically Signed   By: Donzetta KohutGeoffrey  Wile M.D.   On: 02/16/2022 10:28  ? ?Result Date: 02/16/2022 ?CLINICAL DATA:  Testicular pain with worsening since prior imaging. EXAM: SCROTAL ULTRASOUND DOPPLER ULTRASOUND OF THE TESTICLES TECHNIQUE: Complete ultrasound examination of the testicles, epididymis, and other scrotal structures was performed. Color and spectral Doppler ultrasound were also utilized to evaluate blood flow to the testicles.  COMPARISON:  Comparison made with imaging from January 27, 2022 FINDINGS: Right testicle Measurements: 6.1 x 2.0 x 1.9 cm. Heterogeneous testis which is elongated and suffers from mass effect from an adjacent complex hydrocele such that the border along the margin with the hydrocele and the testis is straight implying mass effect and altering the normal shape of the RIGHT testis. Venous and arterial flow can be documented within the testis though with tardus parvus waveform of the testis exhibited and some increased resistance as compared to previous imaging within the testis. Left testicle Measurements: 4.7 x 2.3 x 2.6 cm. Heterogeneity of the LEFT  testis is very similar to the prior exam. No focal mass lesion. Right epididymis:  Not well visualized. Left epididymis: Potential tiny cyst in the LEFT epididymis. Heterogeneous appearance of the LEFT epididymis. Hydrocele: Increase in size and resultant mass-effect upon the RIGHT testis. Hydroceles shows septations and some internal debris. Scrotal collection measuring 7.3 x 4.4 x 5.1 cm, markedly increased in size compared to previous imaging. Varicocele:  None visualized. Pulsed Doppler interrogation of both testes demonstrates tardus parvus waveform on the RIGHT with preserved venous and arterial flow but with increased vascular resistance due to testicular compression. LEFT testis with normal low resistance arterial and venous waveforms. IMPRESSION: Findings of orchitis seen on the prior study have progressed with marked enlargement of a complex RIGHT hydrocele, potentially pyocele of the RIGHT scrotum which shows mass effect upon the RIGHT testis such that there is a tardus parvus waveform and early vascular compromise. Mass effect exhibited by flattening/elongation of the testis and straightening of the boundary between the testicular/scrotal collection interface. Urologic consultation is suggested. LEFT testis with heterogeneity favored to be related to prior  orchitis. Consider 6-8 week follow-up to ensure stability. Electronically Signed: By: Donzetta Kohut M.D. On: 02/16/2022 10:23   ? ?Procedures ?Procedures  ? ? ?Medications Ordered in ED ?Medications  ?morphine (PF) 4

## 2022-02-16 NOTE — ED Notes (Signed)
Extra dark green,blue and gold top sent ?

## 2022-02-16 NOTE — ED Triage Notes (Signed)
Pt states his testicle is still swollen, and worse now. Pt was seen 2 weeks ago per pt and was prescribed antibiotics ,took antibiotics, didn't help. Extremely painful per patient. Denies fever. ?

## 2022-02-16 NOTE — ED Notes (Signed)
Dc instructions reviewed with patient. Patient voiced understanding. Dc with belongings.  °

## 2022-02-17 LAB — URINE CULTURE: Culture: NO GROWTH

## 2022-02-21 DIAGNOSIS — H2513 Age-related nuclear cataract, bilateral: Secondary | ICD-10-CM | POA: Diagnosis not present

## 2022-02-21 DIAGNOSIS — H25042 Posterior subcapsular polar age-related cataract, left eye: Secondary | ICD-10-CM | POA: Diagnosis not present

## 2022-02-21 DIAGNOSIS — H5203 Hypermetropia, bilateral: Secondary | ICD-10-CM | POA: Diagnosis not present

## 2022-02-21 DIAGNOSIS — H35343 Macular cyst, hole, or pseudohole, bilateral: Secondary | ICD-10-CM | POA: Diagnosis not present

## 2022-02-21 DIAGNOSIS — H52223 Regular astigmatism, bilateral: Secondary | ICD-10-CM | POA: Diagnosis not present

## 2022-02-25 DIAGNOSIS — R8290 Unspecified abnormal findings in urine: Secondary | ICD-10-CM | POA: Diagnosis not present

## 2022-02-25 DIAGNOSIS — E559 Vitamin D deficiency, unspecified: Secondary | ICD-10-CM | POA: Diagnosis not present

## 2022-02-25 DIAGNOSIS — J449 Chronic obstructive pulmonary disease, unspecified: Secondary | ICD-10-CM | POA: Diagnosis not present

## 2022-02-26 ENCOUNTER — Inpatient Hospital Stay (HOSPITAL_BASED_OUTPATIENT_CLINIC_OR_DEPARTMENT_OTHER)
Admission: EM | Admit: 2022-02-26 | Discharge: 2022-03-07 | DRG: 727 | Disposition: A | Discharge status: Skilled Nursing Facility | Payer: Medicare Other | Attending: Internal Medicine | Admitting: Internal Medicine

## 2022-02-26 ENCOUNTER — Encounter (HOSPITAL_BASED_OUTPATIENT_CLINIC_OR_DEPARTMENT_OTHER): Payer: Self-pay | Admitting: Obstetrics and Gynecology

## 2022-02-26 ENCOUNTER — Emergency Department (HOSPITAL_BASED_OUTPATIENT_CLINIC_OR_DEPARTMENT_OTHER): Payer: Medicare Other | Admitting: Radiology

## 2022-02-26 ENCOUNTER — Emergency Department (HOSPITAL_BASED_OUTPATIENT_CLINIC_OR_DEPARTMENT_OTHER): Payer: Medicare Other

## 2022-02-26 ENCOUNTER — Other Ambulatory Visit: Payer: Self-pay

## 2022-02-26 DIAGNOSIS — M79661 Pain in right lower leg: Secondary | ICD-10-CM | POA: Diagnosis present

## 2022-02-26 DIAGNOSIS — I110 Hypertensive heart disease with heart failure: Secondary | ICD-10-CM | POA: Diagnosis present

## 2022-02-26 DIAGNOSIS — S79911A Unspecified injury of right hip, initial encounter: Secondary | ICD-10-CM | POA: Diagnosis not present

## 2022-02-26 DIAGNOSIS — M79662 Pain in left lower leg: Secondary | ICD-10-CM | POA: Diagnosis not present

## 2022-02-26 DIAGNOSIS — R1013 Epigastric pain: Secondary | ICD-10-CM | POA: Diagnosis not present

## 2022-02-26 DIAGNOSIS — G253 Myoclonus: Secondary | ICD-10-CM | POA: Diagnosis present

## 2022-02-26 DIAGNOSIS — N453 Epididymo-orchitis: Secondary | ICD-10-CM | POA: Diagnosis not present

## 2022-02-26 DIAGNOSIS — D65 Disseminated intravascular coagulation [defibrination syndrome]: Secondary | ICD-10-CM | POA: Diagnosis present

## 2022-02-26 DIAGNOSIS — Z87891 Personal history of nicotine dependence: Secondary | ICD-10-CM

## 2022-02-26 DIAGNOSIS — N451 Epididymitis: Secondary | ICD-10-CM | POA: Diagnosis present

## 2022-02-26 DIAGNOSIS — Z880 Allergy status to penicillin: Secondary | ICD-10-CM | POA: Diagnosis not present

## 2022-02-26 DIAGNOSIS — E43 Unspecified severe protein-calorie malnutrition: Secondary | ICD-10-CM | POA: Diagnosis present

## 2022-02-26 DIAGNOSIS — D696 Thrombocytopenia, unspecified: Secondary | ICD-10-CM | POA: Diagnosis not present

## 2022-02-26 DIAGNOSIS — Z79899 Other long term (current) drug therapy: Secondary | ICD-10-CM

## 2022-02-26 DIAGNOSIS — Z823 Family history of stroke: Secondary | ICD-10-CM | POA: Diagnosis not present

## 2022-02-26 DIAGNOSIS — M6281 Muscle weakness (generalized): Secondary | ICD-10-CM | POA: Diagnosis not present

## 2022-02-26 DIAGNOSIS — N179 Acute kidney failure, unspecified: Secondary | ICD-10-CM

## 2022-02-26 DIAGNOSIS — I5021 Acute systolic (congestive) heart failure: Secondary | ICD-10-CM | POA: Diagnosis not present

## 2022-02-26 DIAGNOSIS — N433 Hydrocele, unspecified: Secondary | ICD-10-CM | POA: Diagnosis present

## 2022-02-26 DIAGNOSIS — G9341 Metabolic encephalopathy: Secondary | ICD-10-CM | POA: Diagnosis present

## 2022-02-26 DIAGNOSIS — J449 Chronic obstructive pulmonary disease, unspecified: Secondary | ICD-10-CM | POA: Diagnosis present

## 2022-02-26 DIAGNOSIS — G9340 Encephalopathy, unspecified: Secondary | ICD-10-CM | POA: Diagnosis not present

## 2022-02-26 DIAGNOSIS — R2689 Other abnormalities of gait and mobility: Secondary | ICD-10-CM | POA: Diagnosis not present

## 2022-02-26 DIAGNOSIS — N320 Bladder-neck obstruction: Secondary | ICD-10-CM | POA: Diagnosis not present

## 2022-02-26 DIAGNOSIS — E785 Hyperlipidemia, unspecified: Secondary | ICD-10-CM | POA: Diagnosis not present

## 2022-02-26 DIAGNOSIS — S299XXA Unspecified injury of thorax, initial encounter: Secondary | ICD-10-CM | POA: Diagnosis not present

## 2022-02-26 DIAGNOSIS — Z809 Family history of malignant neoplasm, unspecified: Secondary | ICD-10-CM | POA: Diagnosis not present

## 2022-02-26 DIAGNOSIS — R339 Retention of urine, unspecified: Secondary | ICD-10-CM | POA: Diagnosis present

## 2022-02-26 DIAGNOSIS — R404 Transient alteration of awareness: Secondary | ICD-10-CM | POA: Diagnosis not present

## 2022-02-26 DIAGNOSIS — R531 Weakness: Secondary | ICD-10-CM | POA: Diagnosis present

## 2022-02-26 DIAGNOSIS — N400 Enlarged prostate without lower urinary tract symptoms: Secondary | ICD-10-CM | POA: Diagnosis not present

## 2022-02-26 DIAGNOSIS — R338 Other retention of urine: Secondary | ICD-10-CM | POA: Diagnosis present

## 2022-02-26 DIAGNOSIS — I5022 Chronic systolic (congestive) heart failure: Secondary | ICD-10-CM | POA: Diagnosis not present

## 2022-02-26 DIAGNOSIS — G8929 Other chronic pain: Secondary | ICD-10-CM | POA: Diagnosis not present

## 2022-02-26 DIAGNOSIS — D72819 Decreased white blood cell count, unspecified: Secondary | ICD-10-CM

## 2022-02-26 DIAGNOSIS — N452 Orchitis: Secondary | ICD-10-CM | POA: Diagnosis present

## 2022-02-26 DIAGNOSIS — D61818 Other pancytopenia: Secondary | ICD-10-CM | POA: Diagnosis present

## 2022-02-26 DIAGNOSIS — R109 Unspecified abdominal pain: Secondary | ICD-10-CM | POA: Diagnosis present

## 2022-02-26 DIAGNOSIS — I10 Essential (primary) hypertension: Secondary | ICD-10-CM | POA: Diagnosis not present

## 2022-02-26 DIAGNOSIS — I714 Abdominal aortic aneurysm, without rupture, unspecified: Secondary | ICD-10-CM | POA: Diagnosis not present

## 2022-02-26 DIAGNOSIS — Z20822 Contact with and (suspected) exposure to covid-19: Secondary | ICD-10-CM | POA: Diagnosis not present

## 2022-02-26 DIAGNOSIS — M4319 Spondylolisthesis, multiple sites in spine: Secondary | ICD-10-CM | POA: Diagnosis not present

## 2022-02-26 DIAGNOSIS — R1310 Dysphagia, unspecified: Secondary | ICD-10-CM | POA: Diagnosis not present

## 2022-02-26 DIAGNOSIS — I7410 Embolism and thrombosis of unspecified parts of aorta: Secondary | ICD-10-CM | POA: Diagnosis not present

## 2022-02-26 DIAGNOSIS — Z7189 Other specified counseling: Secondary | ICD-10-CM | POA: Diagnosis not present

## 2022-02-26 DIAGNOSIS — Z4682 Encounter for fitting and adjustment of non-vascular catheter: Secondary | ICD-10-CM | POA: Diagnosis not present

## 2022-02-26 DIAGNOSIS — M47816 Spondylosis without myelopathy or radiculopathy, lumbar region: Secondary | ICD-10-CM | POA: Diagnosis not present

## 2022-02-26 DIAGNOSIS — N138 Other obstructive and reflux uropathy: Secondary | ICD-10-CM | POA: Diagnosis present

## 2022-02-26 DIAGNOSIS — R269 Unspecified abnormalities of gait and mobility: Secondary | ICD-10-CM | POA: Diagnosis not present

## 2022-02-26 DIAGNOSIS — Z515 Encounter for palliative care: Secondary | ICD-10-CM

## 2022-02-26 DIAGNOSIS — M79605 Pain in left leg: Secondary | ICD-10-CM | POA: Diagnosis not present

## 2022-02-26 DIAGNOSIS — N401 Enlarged prostate with lower urinary tract symptoms: Secondary | ICD-10-CM | POA: Diagnosis present

## 2022-02-26 DIAGNOSIS — S79912A Unspecified injury of left hip, initial encounter: Secondary | ICD-10-CM | POA: Diagnosis not present

## 2022-02-26 DIAGNOSIS — F0390 Unspecified dementia without behavioral disturbance: Secondary | ICD-10-CM

## 2022-02-26 DIAGNOSIS — S8992XA Unspecified injury of left lower leg, initial encounter: Secondary | ICD-10-CM | POA: Diagnosis not present

## 2022-02-26 DIAGNOSIS — Z743 Need for continuous supervision: Secondary | ICD-10-CM | POA: Diagnosis not present

## 2022-02-26 DIAGNOSIS — M79604 Pain in right leg: Secondary | ICD-10-CM | POA: Diagnosis not present

## 2022-02-26 LAB — BASIC METABOLIC PANEL
Anion gap: 7 (ref 5–15)
BUN: 43 mg/dL — ABNORMAL HIGH (ref 8–23)
CO2: 30 mmol/L (ref 22–32)
Calcium: 9.4 mg/dL (ref 8.9–10.3)
Chloride: 98 mmol/L (ref 98–111)
Creatinine, Ser: 1.33 mg/dL — ABNORMAL HIGH (ref 0.61–1.24)
GFR, Estimated: 52 mL/min — ABNORMAL LOW (ref >60)
Glucose, Bld: 141 mg/dL — ABNORMAL HIGH (ref 70–99)
Potassium: 4.4 mmol/L (ref 3.5–5.1)
Sodium: 135 mmol/L (ref 135–145)

## 2022-02-26 LAB — CBC
HCT: 43 % (ref 39.0–52.0)
Hemoglobin: 14 g/dL (ref 13.0–17.0)
MCH: 28.3 pg (ref 26.0–34.0)
MCHC: 32.6 g/dL (ref 30.0–36.0)
MCV: 86.9 fL (ref 80.0–100.0)
Platelets: 44 10*3/uL — ABNORMAL LOW (ref 150–400)
RBC: 4.95 MIL/uL (ref 4.22–5.81)
RDW: 14.1 % (ref 11.5–15.5)
WBC: 3.8 10*3/uL — ABNORMAL LOW (ref 4.0–10.5)
nRBC: 0 % (ref 0.0–0.2)

## 2022-02-26 LAB — CBC WITH DIFFERENTIAL/PLATELET
Abs Immature Granulocytes: 0 10*3/uL (ref 0.00–0.07)
Basophils Absolute: 0 10*3/uL (ref 0.0–0.1)
Basophils Relative: 0 %
Eosinophils Absolute: 0 10*3/uL (ref 0.0–0.5)
Eosinophils Relative: 0 %
HCT: 42.3 % (ref 39.0–52.0)
Hemoglobin: 13.9 g/dL (ref 13.0–17.0)
Lymphocytes Relative: 18 %
Lymphs Abs: 0.6 10*3/uL — ABNORMAL LOW (ref 0.7–4.0)
MCH: 28.6 pg (ref 26.0–34.0)
MCHC: 32.9 g/dL (ref 30.0–36.0)
MCV: 87 fL (ref 80.0–100.0)
Monocytes Absolute: 0.3 10*3/uL (ref 0.1–1.0)
Monocytes Relative: 8 %
Neutro Abs: 2.4 10*3/uL (ref 1.7–7.7)
Neutrophils Relative %: 74 %
Platelets: 42 10*3/uL — ABNORMAL LOW (ref 150–400)
RBC: 4.86 MIL/uL (ref 4.22–5.81)
RDW: 14.1 % (ref 11.5–15.5)
WBC: 3.2 10*3/uL — ABNORMAL LOW (ref 4.0–10.5)
nRBC: 0 % (ref 0.0–0.2)

## 2022-02-26 LAB — HEPATIC FUNCTION PANEL
ALT: 33 U/L (ref 0–44)
AST: 47 U/L — ABNORMAL HIGH (ref 15–41)
Albumin: 3.4 g/dL — ABNORMAL LOW (ref 3.5–5.0)
Alkaline Phosphatase: 64 U/L (ref 38–126)
Bilirubin, Direct: 0.1 mg/dL (ref 0.0–0.2)
Indirect Bilirubin: 0.4 mg/dL (ref 0.3–0.9)
Total Bilirubin: 0.5 mg/dL (ref 0.3–1.2)
Total Protein: 6.2 g/dL — ABNORMAL LOW (ref 6.5–8.1)

## 2022-02-26 LAB — PROTIME-INR
INR: 1.1 (ref 0.8–1.2)
Prothrombin Time: 14.4 seconds (ref 11.4–15.2)

## 2022-02-26 LAB — RAPID URINE DRUG SCREEN, HOSP PERFORMED
Amphetamines: NOT DETECTED
Barbiturates: NOT DETECTED
Benzodiazepines: NOT DETECTED
Cocaine: NOT DETECTED
Opiates: NOT DETECTED
Tetrahydrocannabinol: NOT DETECTED

## 2022-02-26 LAB — URINALYSIS, ROUTINE W REFLEX MICROSCOPIC
Bilirubin Urine: NEGATIVE
Glucose, UA: NEGATIVE mg/dL
Ketones, ur: NEGATIVE mg/dL
Leukocytes,Ua: NEGATIVE
Nitrite: NEGATIVE
Protein, ur: 30 mg/dL — AB
Specific Gravity, Urine: 1.022 (ref 1.005–1.030)
pH: 5.5 (ref 5.0–8.0)

## 2022-02-26 LAB — I-STAT VENOUS BLOOD GAS, ED
Acid-Base Excess: 5 mmol/L — ABNORMAL HIGH (ref 0.0–2.0)
Bicarbonate: 32.4 mmol/L — ABNORMAL HIGH (ref 20.0–28.0)
Calcium, Ion: 1.31 mmol/L (ref 1.15–1.40)
HCT: 42 % (ref 39.0–52.0)
Hemoglobin: 14.3 g/dL (ref 13.0–17.0)
O2 Saturation: 20 %
Patient temperature: 36.9
Potassium: 4.3 mmol/L (ref 3.5–5.1)
Sodium: 134 mmol/L — ABNORMAL LOW (ref 135–145)
TCO2: 34 mmol/L — ABNORMAL HIGH (ref 22–32)
pCO2, Ven: 60.7 mmHg — ABNORMAL HIGH (ref 44–60)
pH, Ven: 7.335 (ref 7.25–7.43)
pO2, Ven: 17 mmHg — CL (ref 32–45)

## 2022-02-26 LAB — AMMONIA: Ammonia: 33 umol/L (ref 9–35)

## 2022-02-26 LAB — TROPONIN I (HIGH SENSITIVITY)
Troponin I (High Sensitivity): 18 ng/L — ABNORMAL HIGH (ref <18)
Troponin I (High Sensitivity): 18 ng/L — ABNORMAL HIGH (ref <18)

## 2022-02-26 LAB — MAGNESIUM: Magnesium: 2.9 mg/dL — ABNORMAL HIGH (ref 1.7–2.4)

## 2022-02-26 LAB — LACTIC ACID, PLASMA
Lactic Acid, Venous: 1.6 mmol/L (ref 0.5–1.9)
Lactic Acid, Venous: 2 mmol/L (ref 0.5–1.9)

## 2022-02-26 LAB — ETHANOL: Alcohol, Ethyl (B): 10 mg/dL — ABNORMAL HIGH (ref <10)

## 2022-02-26 LAB — APTT: aPTT: 34 seconds (ref 24–36)

## 2022-02-26 IMAGING — CT CT HEAD W/O CM
5 of 8 series · 18 of 47 positions shown, 19 images · non-contrast
Comparison: [DATE]

CLINICAL DATA: Altered mental status



[Series 2: head wo · axial · 0.47mm/px · z∈[-226,-181]mm · 2 of 28 slices shown, 3 images (1 of 2)]
[im 10/28  brain]
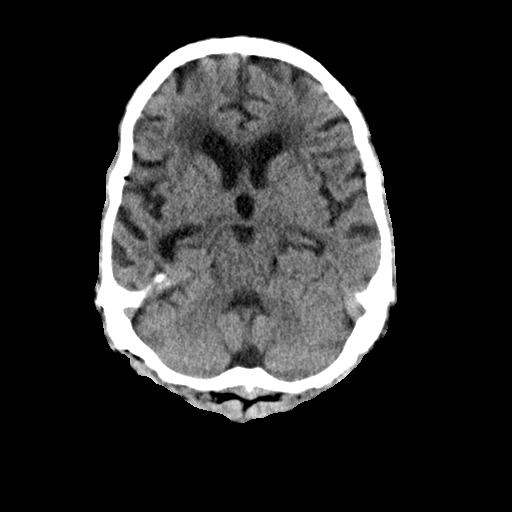
[im 10/28  bone]
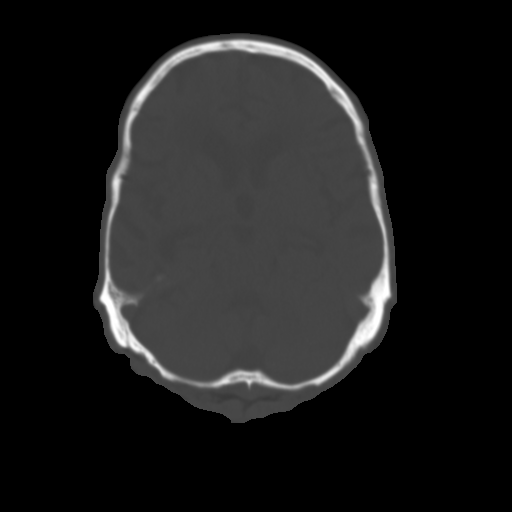
[im 19/28  brain]
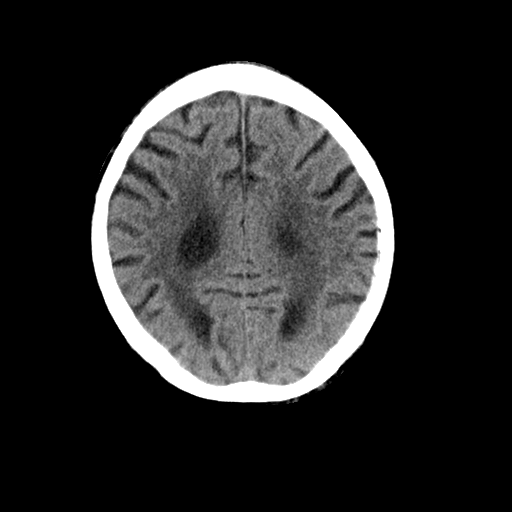

[Series 3: head bone · axial · 0.47mm/px · z∈[-259,-149]mm · 8 of 69 slices shown]
[im 7/69  bone]
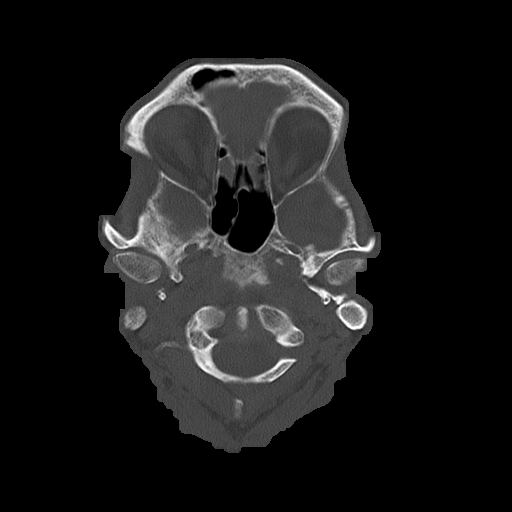
[im 14/69  bone]
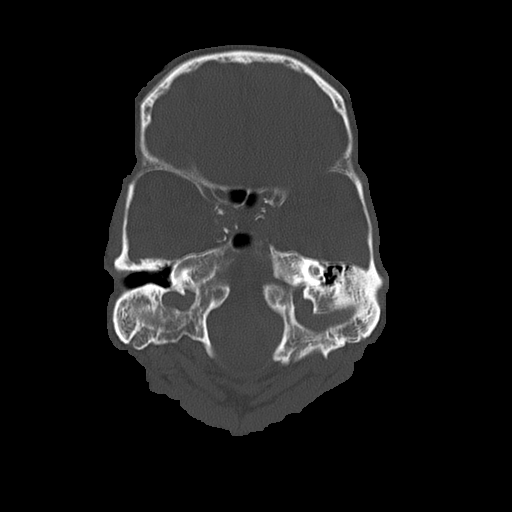
[im 21/69  bone]
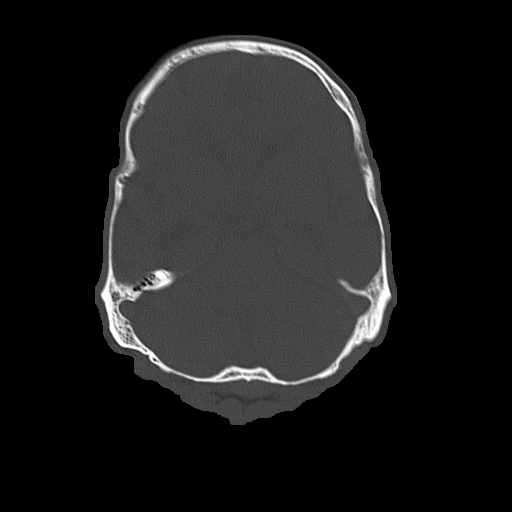
[im 28/69  bone]
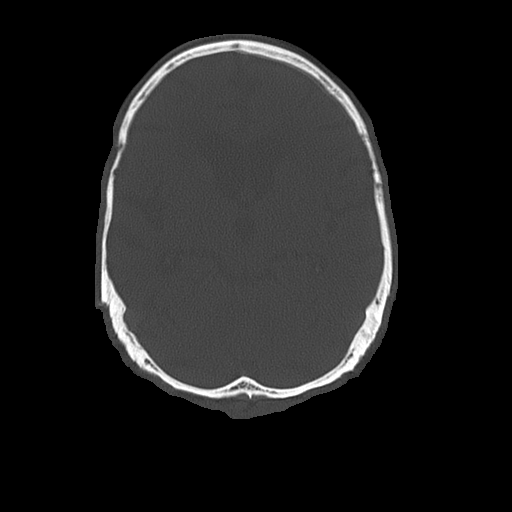
[im 41/69  bone]
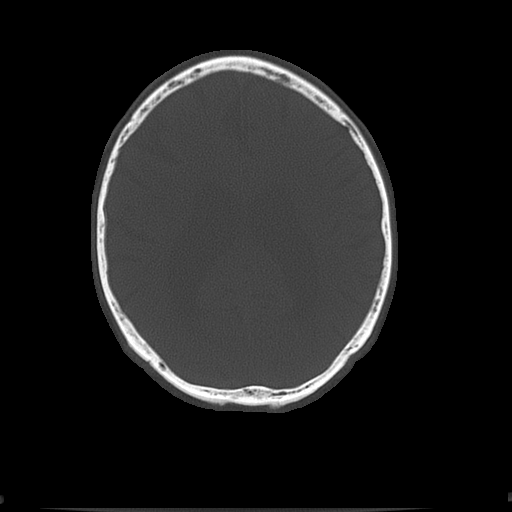
[im 48/69  bone]
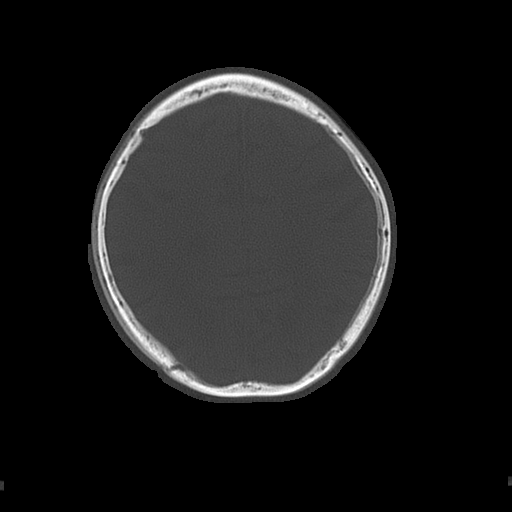
[im 55/69  bone]
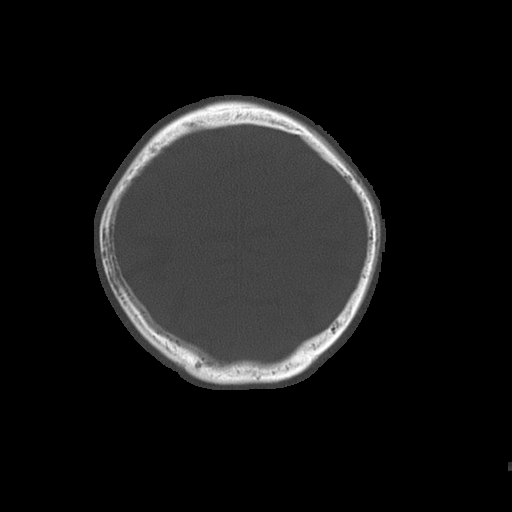
[im 62/69  bone]
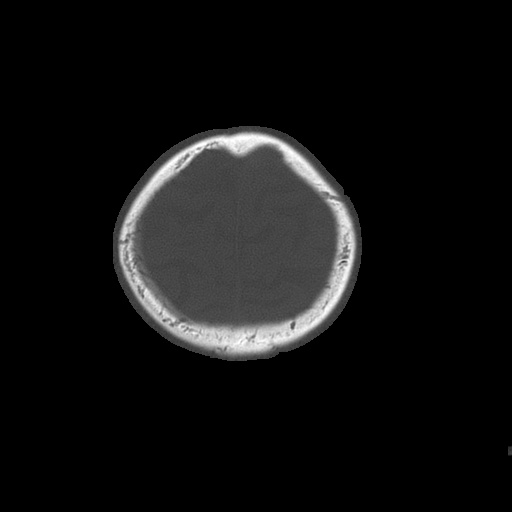

[Series 5: head wo · axial · 0.43mm/px · z∈[-226,-181]mm · 2 of 28 slices shown (2 of 2)]
[im 10/28  brain]
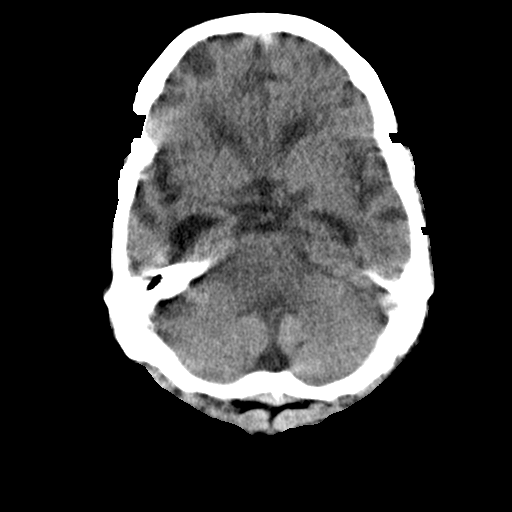
[im 19/28  brain]
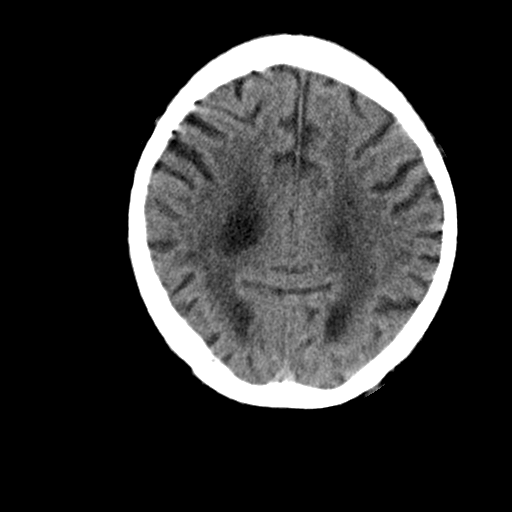

[Series 8: sagittal soft · sagittal · 0.35mm/px · 3 of 50 slices shown]
[im 19/50  brain]
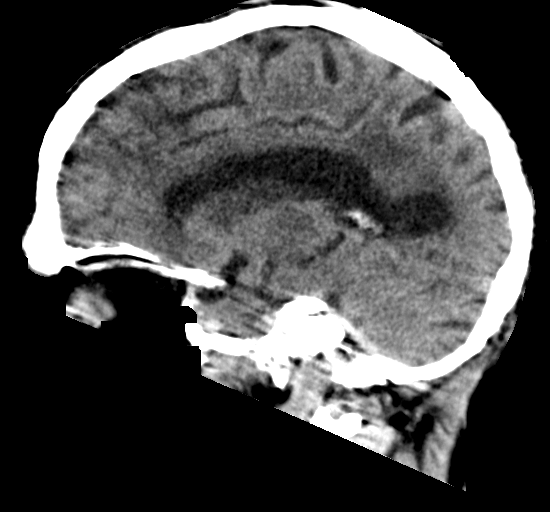
[im 27/50  brain]
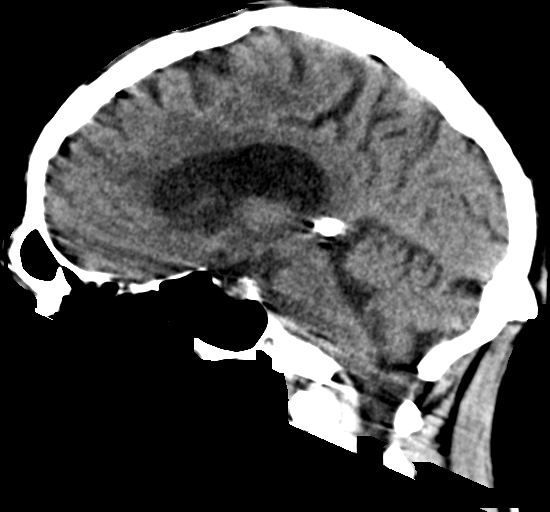
[im 34/50  brain]
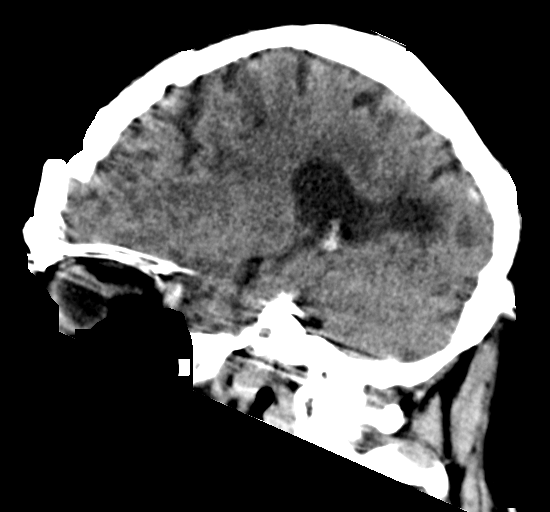

[Series 9: coronal soft · coronal · 0.34mm/px · 3 of 63 slices shown]
[im 16/63  brain]
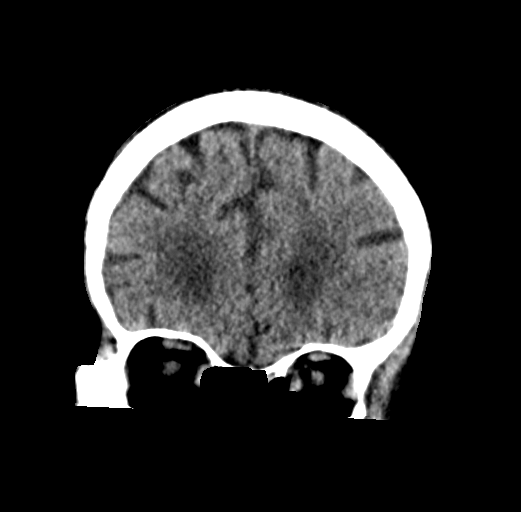
[im 32/63  brain]
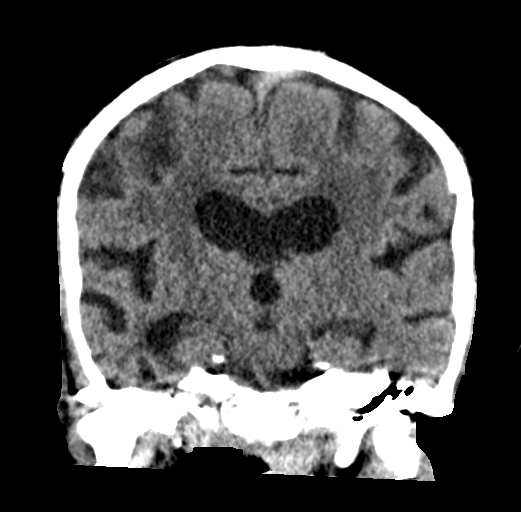
[im 47/63  brain]
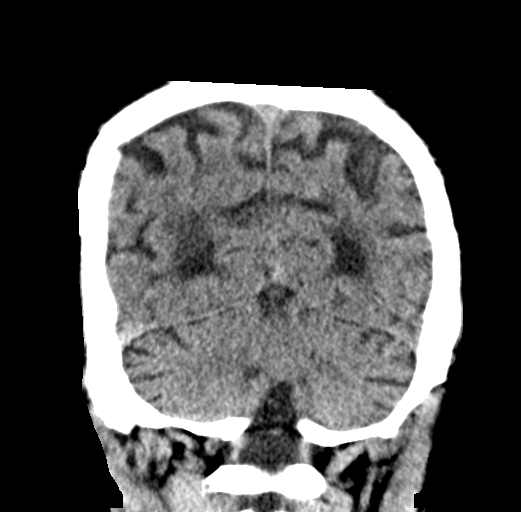

[18 of 47 positions shown; findings below may reference images not displayed]

FINDINGS: There is patient motion in many of the images limiting the study.

Brain: No acute intracranial findings are seen. There are no signs
of intracranial bleeding. Cortical sulci are prominent. There is
decreased density in periventricular and subcortical white matter.

Vascular: Unremarkable.

Skull: No displaced fractures are seen.

Sinuses/Orbits: Unremarkable.

Other: None
IMPRESSION: Motion limited study.

As far as seen, no acute intracranial findings are noted. Atrophy.
Small-vessel disease.

## 2022-02-26 IMAGING — DX DG CHEST 2V
2 series · 2 of 2 positions shown · non-contrast
Comparison: Previous studies including the examination done on
[DATE]

CLINICAL DATA: Trauma, fall

EXAM:
CHEST - 2 VIEW

[chest lat]
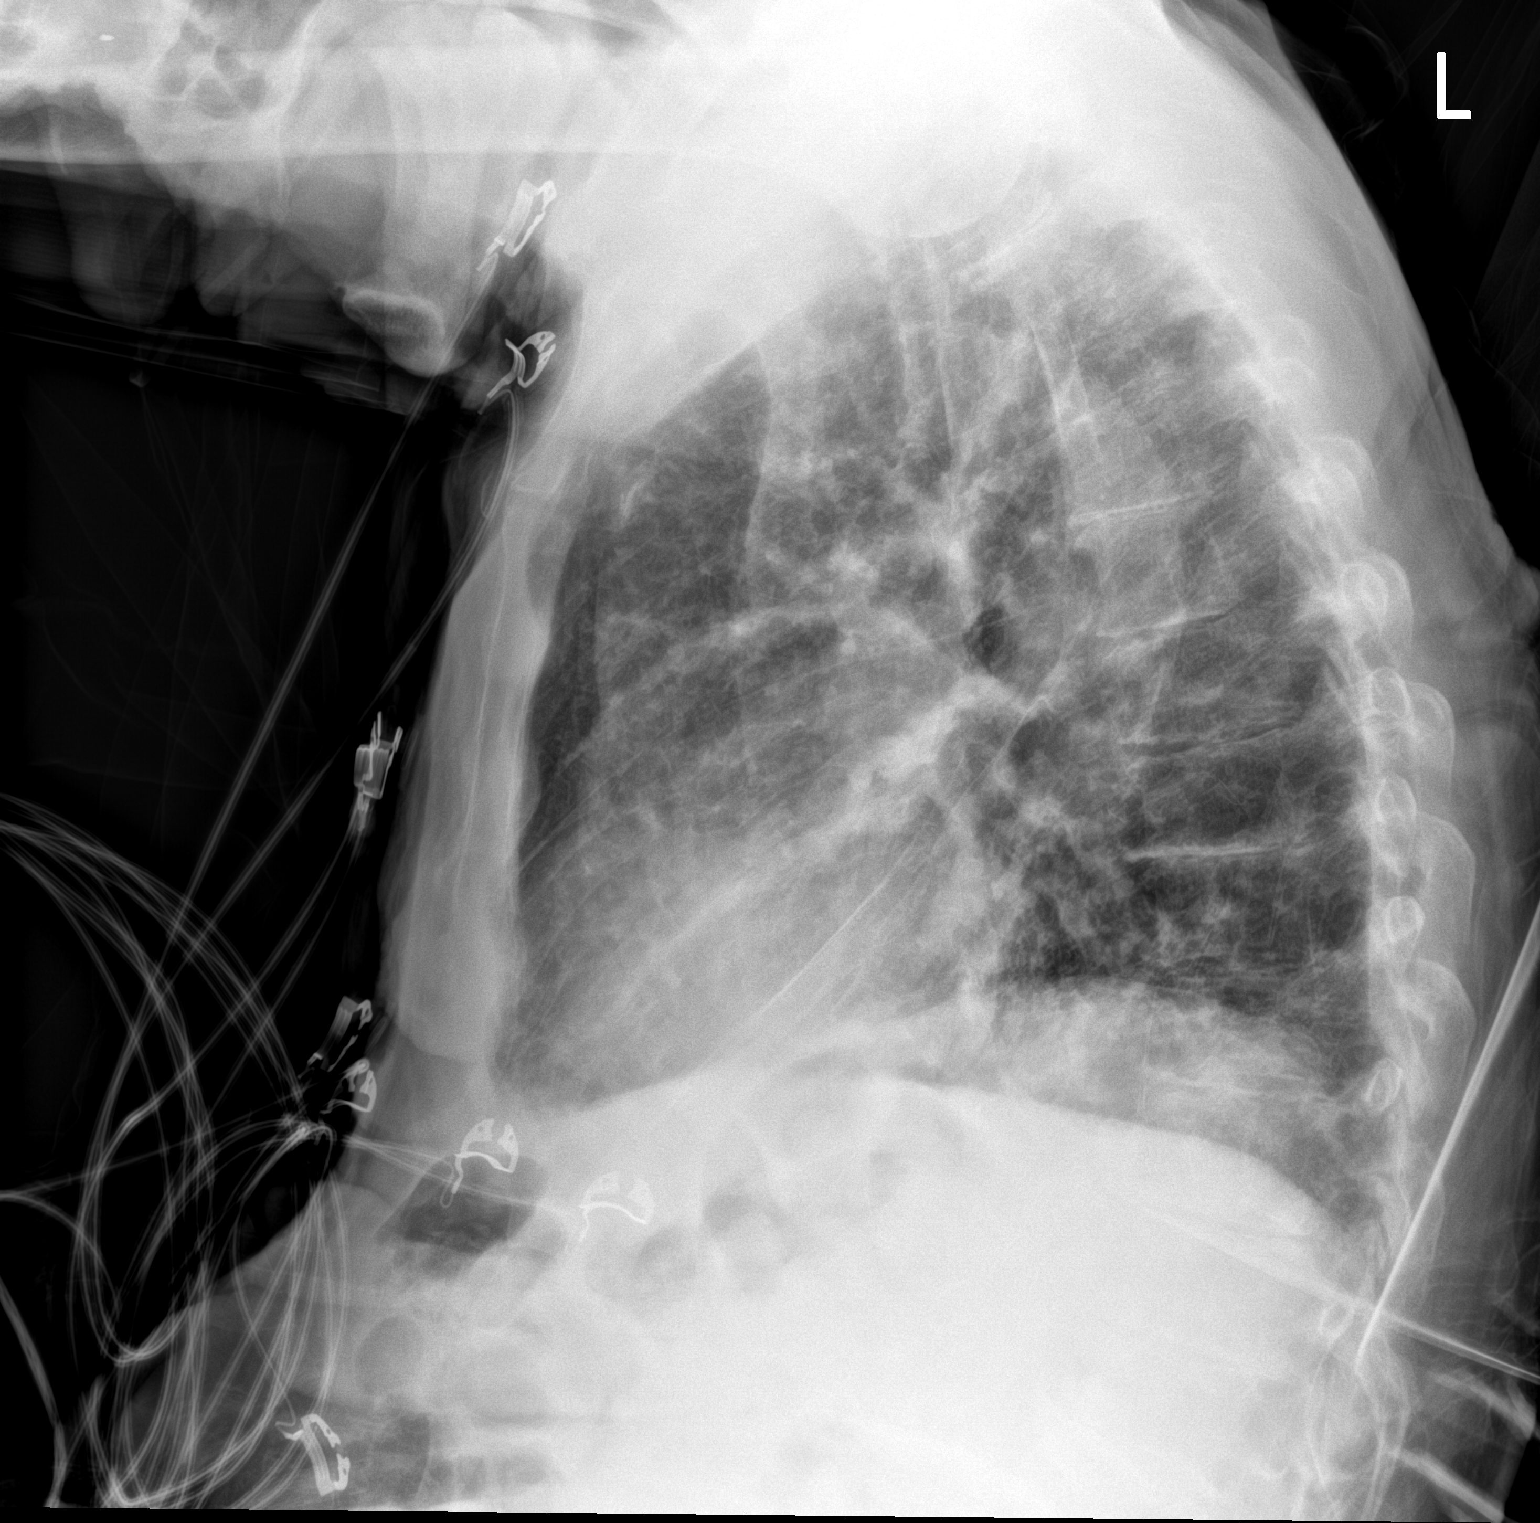

[chest ap]
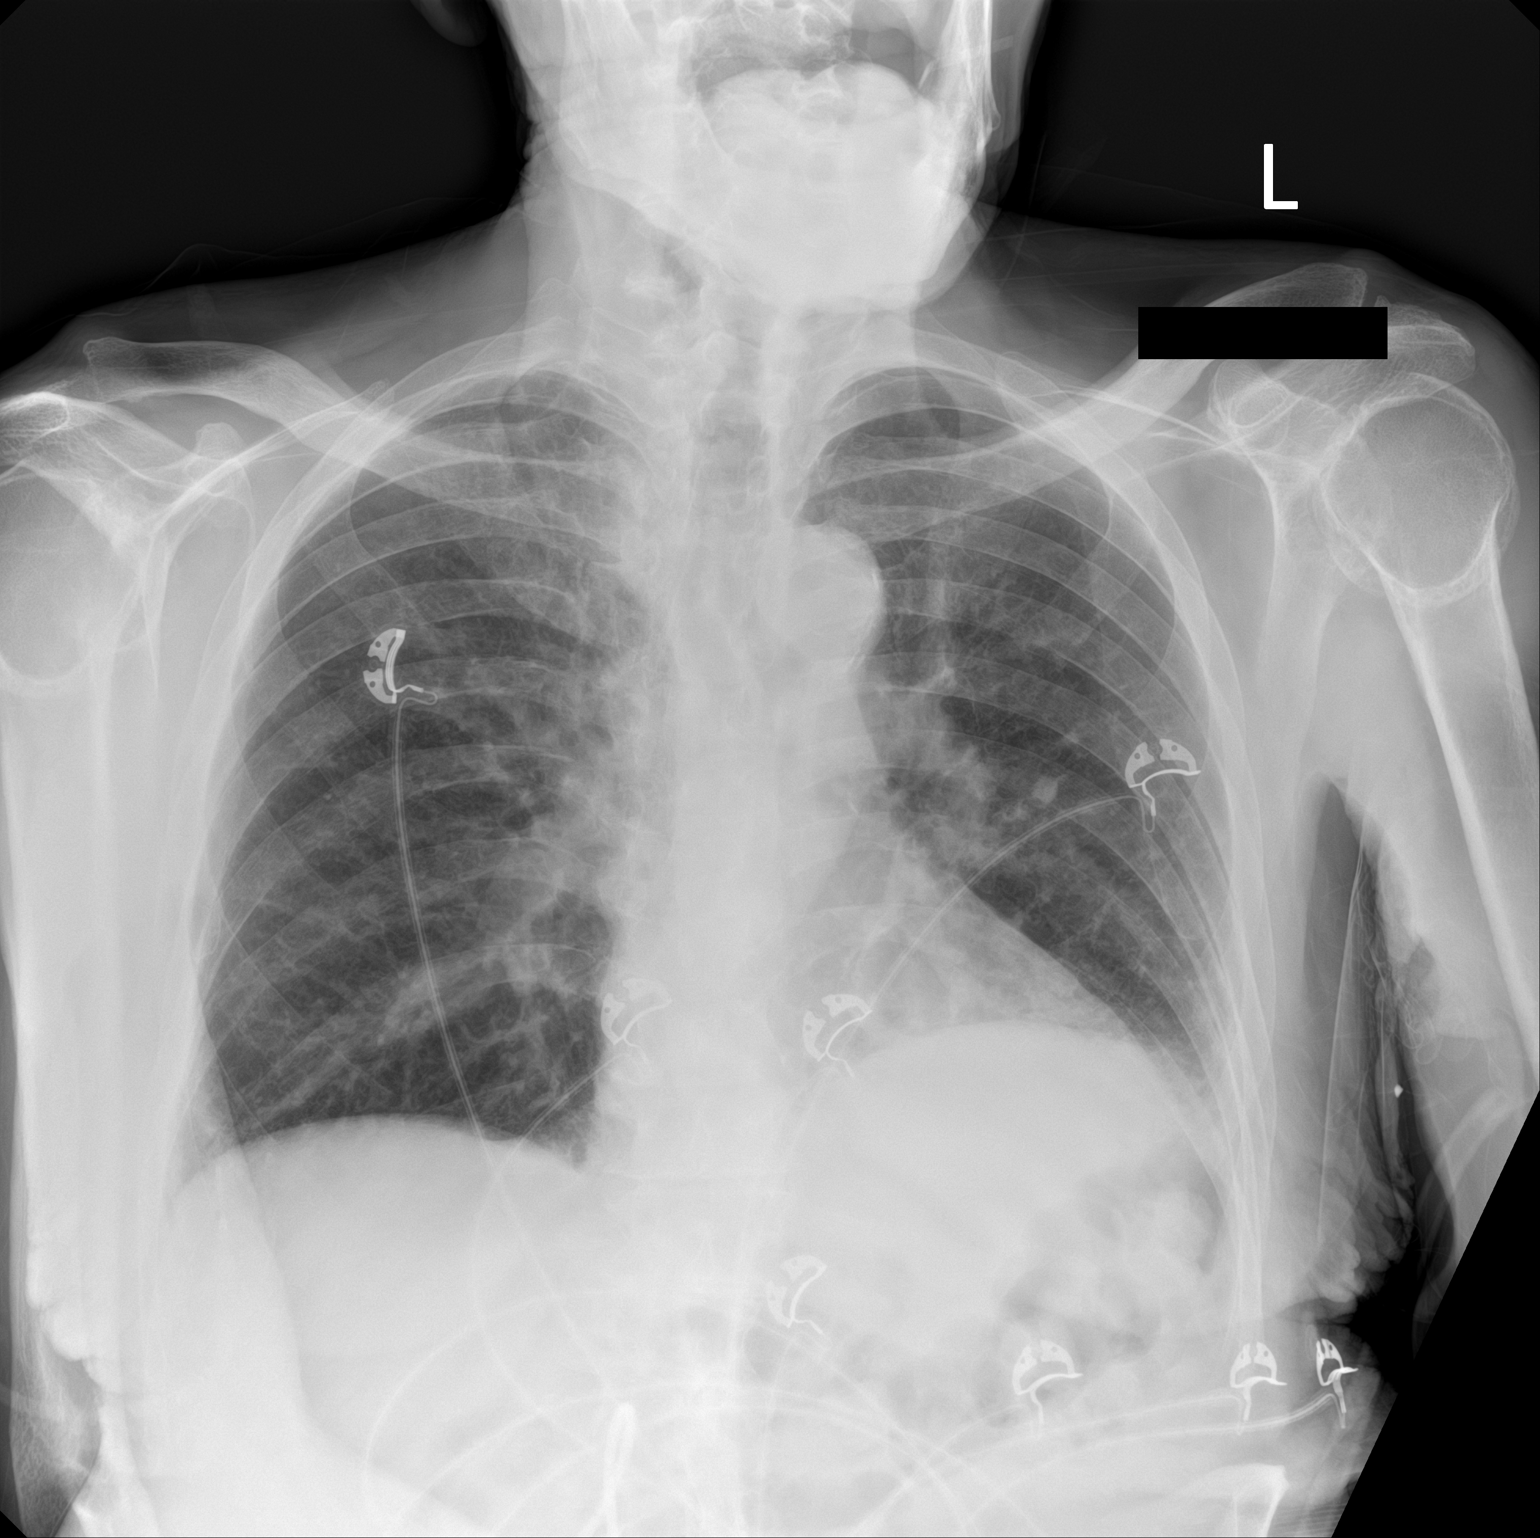

[2 of 2 positions shown; findings below may reference images not displayed]

FINDINGS: Transverse diameter of heart is slightly increased. Central
pulmonary vessels are prominent. There are no signs of alveolar
pulmonary edema. Left hemidiaphragm is elevated. There is crowding
of markings in the left lower lung fields. There is blunting of left
lateral CP angle.
IMPRESSION: Linear densities in the left lower lung fields may suggest crowding
of markings due to poor inspiration or subsegmental atelectasis.
Blunting of left lateral CP angle may be due to small effusion or
pleural thickening.

## 2022-02-26 IMAGING — DX DG HIP (WITH OR WITHOUT PELVIS) 3-4V BILAT
5 series · 5 of 5 positions shown · non-contrast
Comparison: None Available.

CLINICAL DATA: Trauma, fall

EXAM:
DG HIP (WITH OR WITHOUT PELVIS) 3-4V BILAT

[pelvis ap]
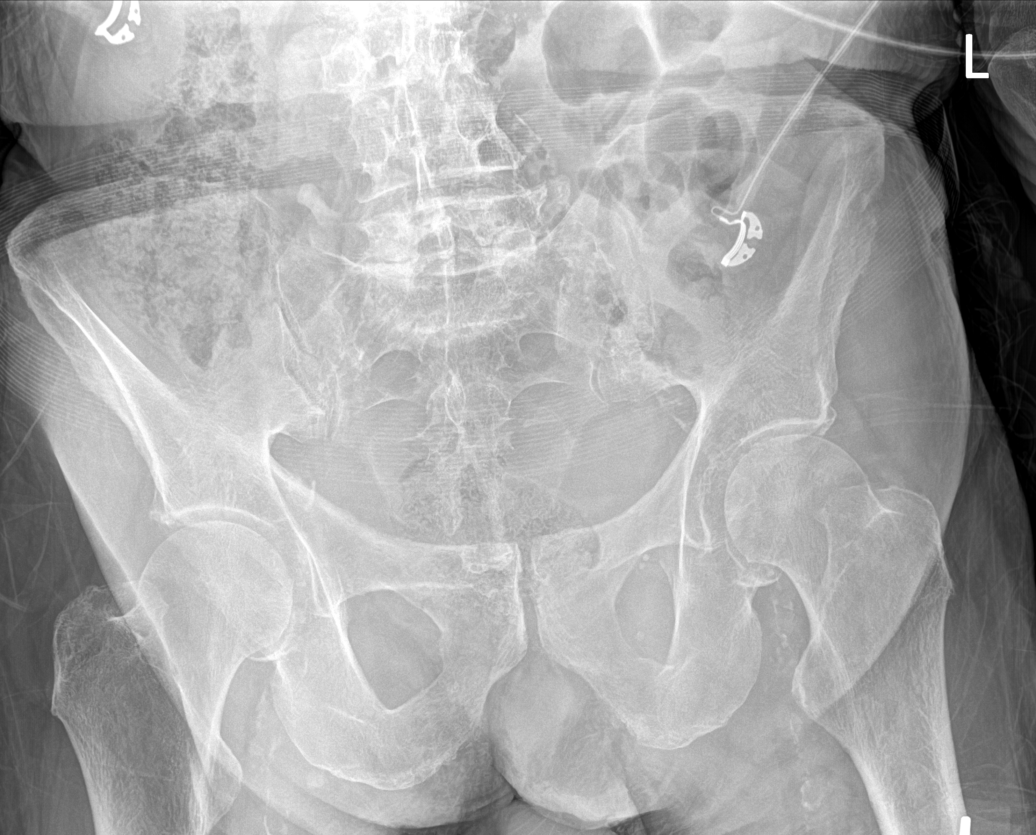

[hip ap (1 of 2)]
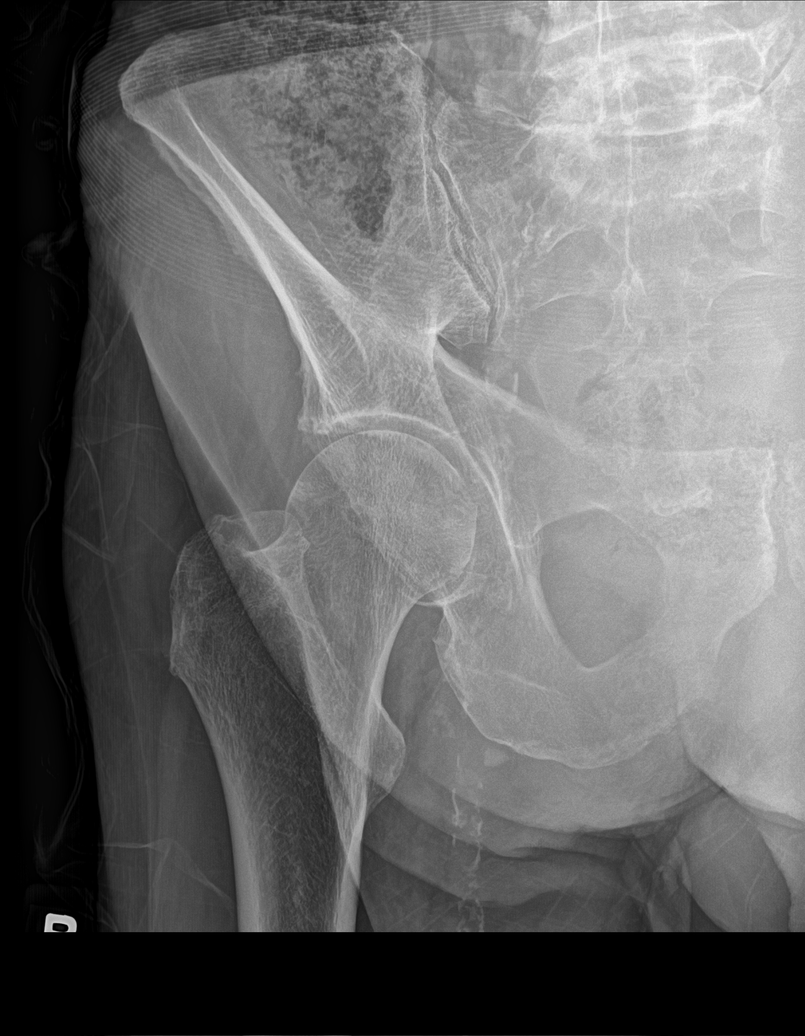

[hip ap (2 of 2)]
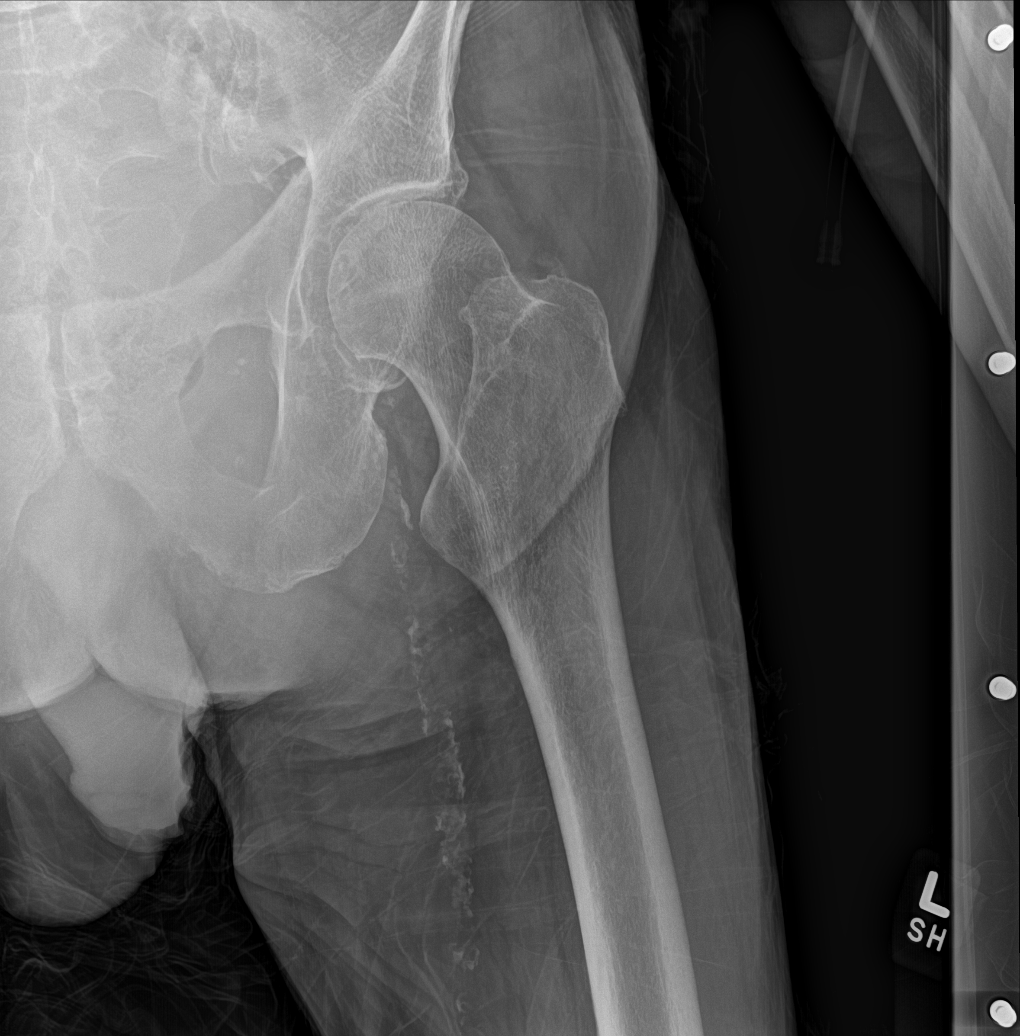

[hip frog leg (1 of 2)]
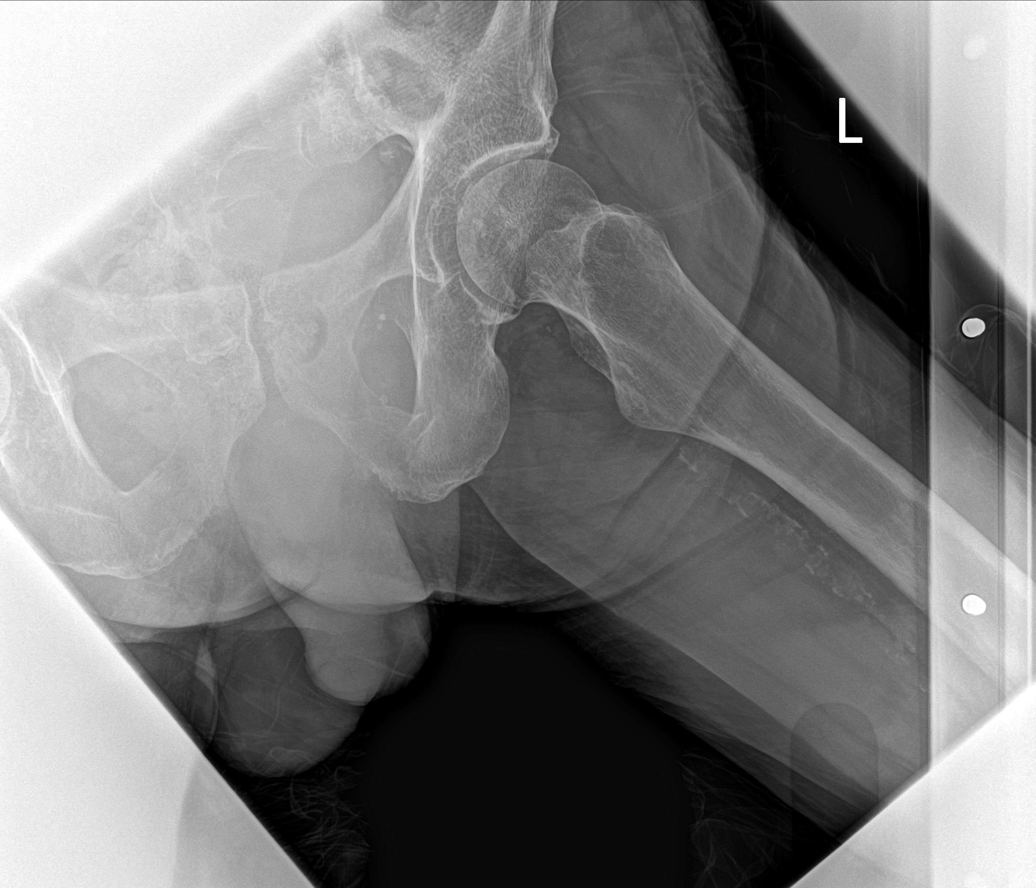

[hip frog leg (2 of 2)]
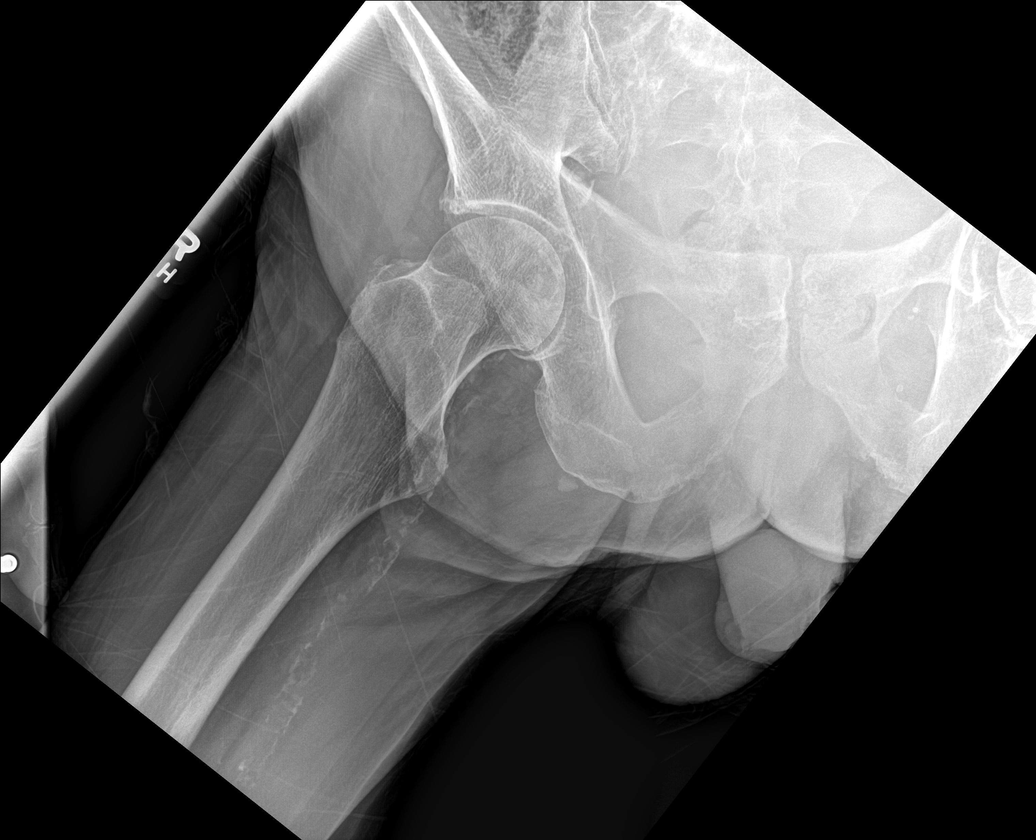

[5 of 5 positions shown; findings below may reference images not displayed]

FINDINGS: No recent fracture or dislocation is seen. There is no significant
narrowing of joint space in both hips. Arterial calcifications are
seen in the soft tissues. Degenerative changes are noted in the
visualized lower lumbar spine.
IMPRESSION: No recent fracture or dislocation is seen in both hips.

Lumbar spondylosis.

## 2022-02-26 IMAGING — DX DG KNEE COMPLETE 4+V*L*
4 series · 4 of 4 positions shown · non-contrast
Comparison: None

CLINICAL DATA: Trauma, fall

EXAM:
LEFT KNEE - COMPLETE 4+ VIEW

[knee obl (1 of 2)]
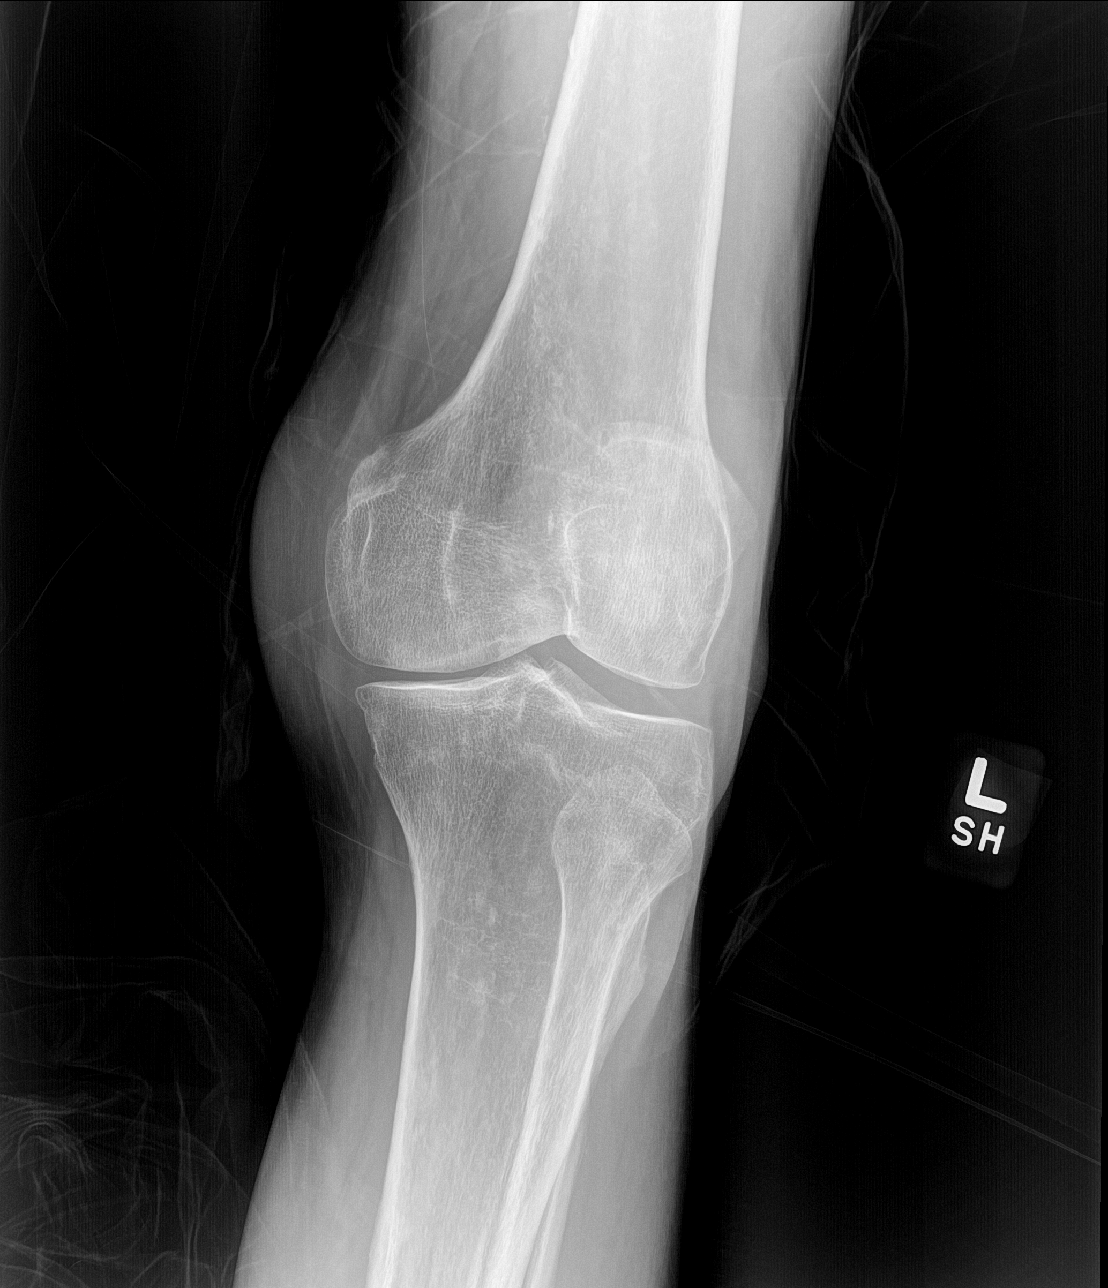

[knee lat]
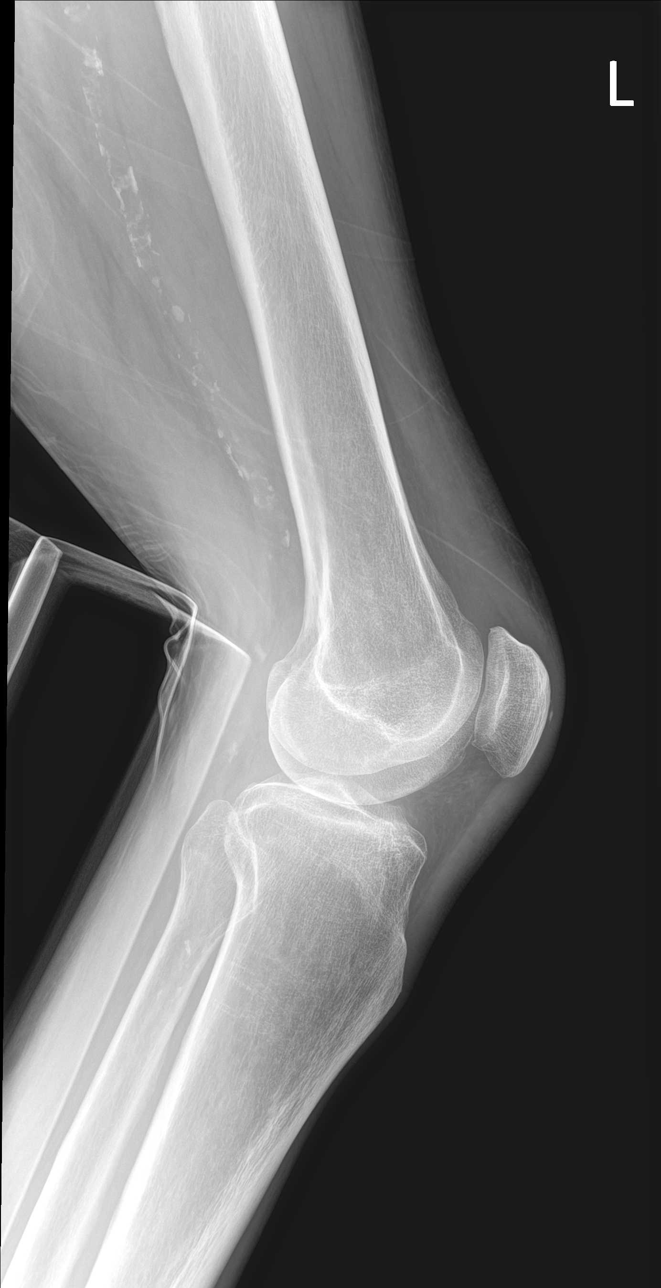

[knee ap]
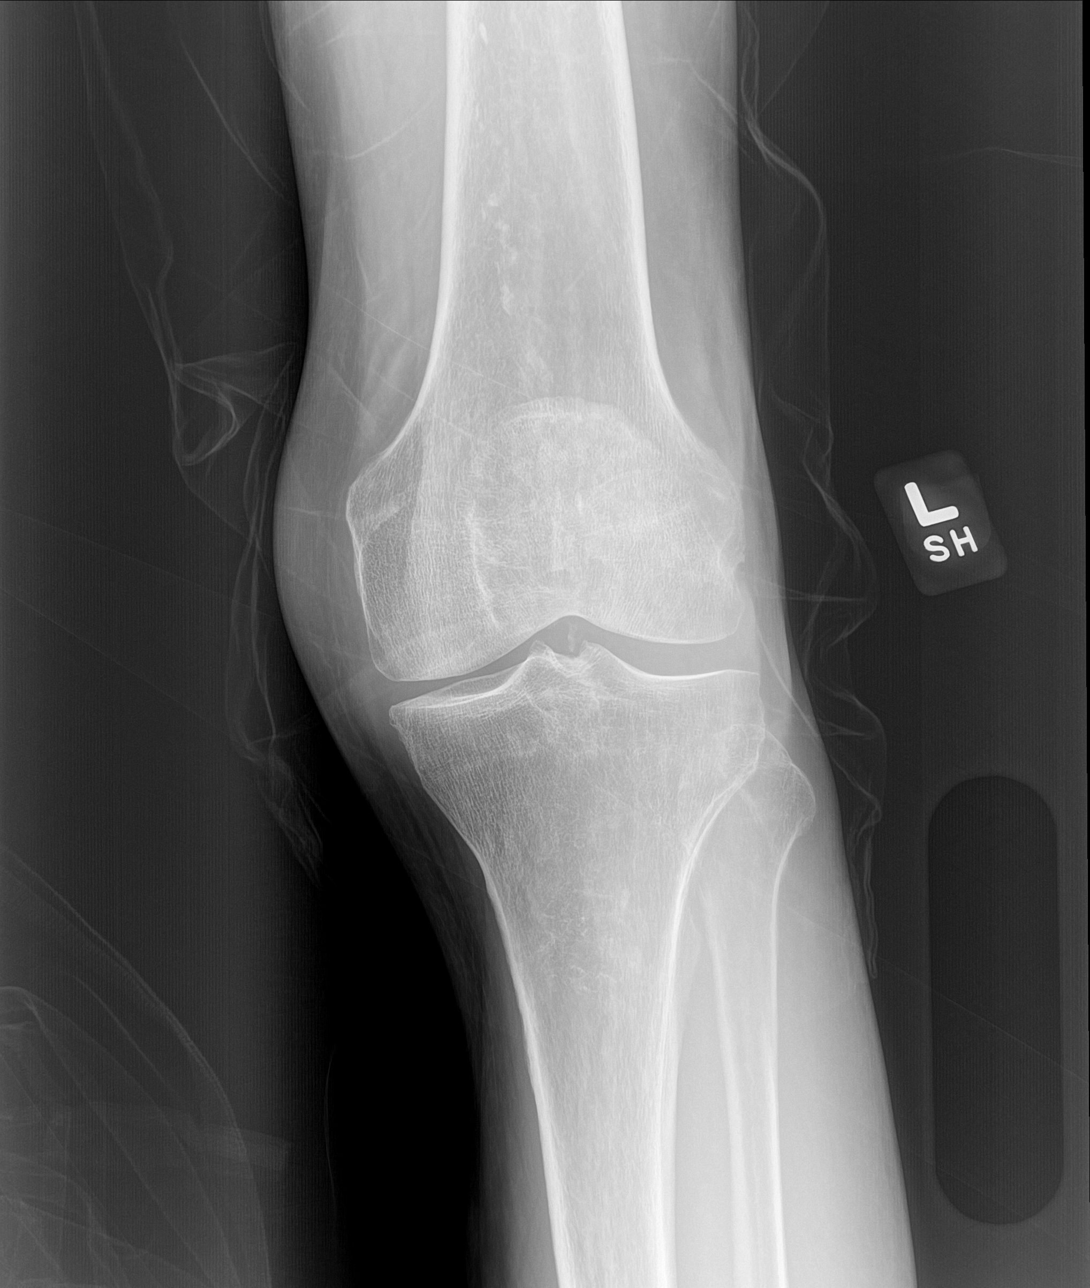

[knee obl (2 of 2)]
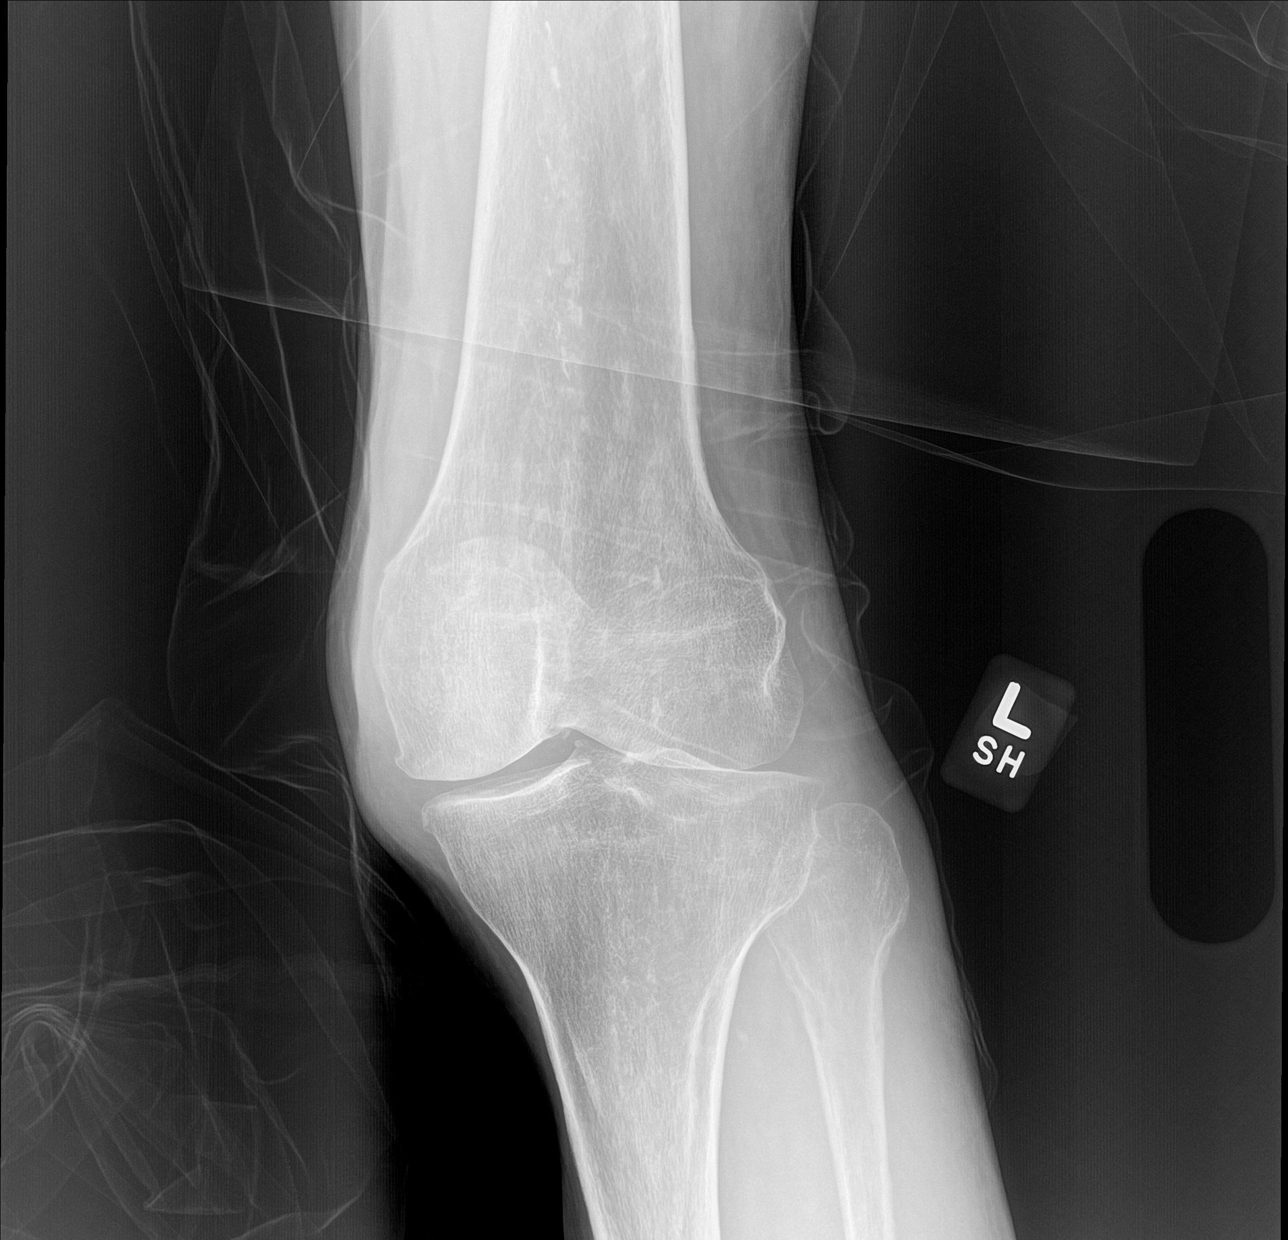

[4 of 4 positions shown; findings below may reference images not displayed]

FINDINGS: No recent fracture or dislocation is seen. There is 3 mm linear
smooth marginated calcification anterior to the patella, possibly
soft tissue calcification from previous injury. There is no
significant effusion in the left knee. Tiny bony spurs seen in the
patella and medial compartment. Arterial calcifications are seen in
the soft tissues.
IMPRESSION: No recent fracture or dislocation is seen. Degenerative changes with
minimal bony spurs are noted in the medial and patellofemoral
compartments.

## 2022-02-26 MED ORDER — VANCOMYCIN HCL IN DEXTROSE 1-5 GM/200ML-% IV SOLN
1000.0000 mg | Freq: Once | INTRAVENOUS | Status: AC
  Administered 2022-02-26: 1000 mg via INTRAVENOUS
  Filled 2022-02-26: qty 200

## 2022-02-26 MED ORDER — SODIUM CHLORIDE 0.9 % IV SOLN
2.0000 g | Freq: Two times a day (BID) | INTRAVENOUS | Status: DC
  Administered 2022-02-26 – 2022-02-27 (×2): 2 g via INTRAVENOUS
  Filled 2022-02-26 (×2): qty 12.5

## 2022-02-26 MED ORDER — SODIUM CHLORIDE 0.9 % IV SOLN: INTRAVENOUS | Status: DC

## 2022-02-26 MED ORDER — SODIUM CHLORIDE 0.9 % IV BOLUS
1000.0000 mL | Freq: Once | INTRAVENOUS | Status: AC
  Administered 2022-02-26: 1000 mL via INTRAVENOUS

## 2022-02-26 MED ORDER — ALBUTEROL SULFATE HFA 108 (90 BASE) MCG/ACT IN AERS: 2.0000 | INHALATION_SPRAY | Freq: Four times a day (QID) | RESPIRATORY_TRACT | Status: DC | PRN

## 2022-02-26 MED ORDER — VANCOMYCIN HCL 750 MG/150ML IV SOLN
750.0000 mg | INTRAVENOUS | Status: DC
  Filled 2022-02-26: qty 150

## 2022-02-26 MED ORDER — NALOXONE HCL 0.4 MG/ML IJ SOLN
0.4000 mg | Freq: Once | INTRAMUSCULAR | Status: AC
  Administered 2022-02-26: 0.4 mg via INTRAVENOUS
  Filled 2022-02-26: qty 1

## 2022-02-26 NOTE — ED Notes (Signed)
Pt given cup of water, able to swallow and keep fluids down.  ?

## 2022-02-26 NOTE — Progress Notes (Signed)
Pharmacy Antibiotic Note ? ?Kenneth Woods is a 88 y.o. male admitted on 02/26/2022 with sepsis.  Pharmacy has been consulted for cefepime and vancomycin dosing. Of note, patient has an allergy of anaphylaxis listed to penicillins but has tolerated a dose of ceftriaxone recently.  ? ?WBC low, SCr mildly elevated  ? ?Plan: ?-Cefepime 2 gm IV Q 12 hours ?-Vancomycin 1 gm load followed by Vancomycin 750 mg IV Q 24 hrs. Goal AUC 400-550. ?Expected AUC: 461 ?SCr used: 1.33  ?-Monitor CBC, renal fx, cultures and clinical progress ?-Vanc levels as indicated  ? ? ?Height: 5\' 5"  (165.1 cm) ?Weight: 68 kg (150 lb) ?IBW/kg (Calculated) : 61.5 ? ?Temp (24hrs), Avg:98.6 ?F (37 ?C), Min:98.5 ?F (36.9 ?C), Max:98.7 ?F (37.1 ?C) ? ?Recent Labs  ?Lab 02/26/22 ?1206 02/26/22 ?1207 02/26/22 ?1401  ?WBC  --  3.8* 3.2*  ?CREATININE  --  1.33*  --   ?LATICACIDVEN 1.6  --  2.0*  ?  ?Estimated Creatinine Clearance: 34.7 mL/min (A) (by C-G formula based on SCr of 1.33 mg/dL (H)).   ? ?Allergies  ?Allergen Reactions  ? Penicillins Anaphylaxis  ? ? ?Antimicrobials this admission: ?Cefepime 5/14 >>  ?Vancomycin 5/14 >>  ? ?Dose adjustments this admission: ? ? ?Microbiology results: ?5/14 BCx:  ?5/14 UCx:   ? ? ?Thank you for allowing pharmacy to be a part of this patient?s care. ? ?Albertina Parr, PharmD., BCCCP ?Clinical Pharmacist ?Please refer to AMION for unit-specific pharmacist  ? ? ?

## 2022-02-26 NOTE — ED Notes (Signed)
Care link notified for pt transport.  

## 2022-02-26 NOTE — ED Triage Notes (Signed)
Patient reports to the ER for confusion/fatigue. Patient reportedly went to Urgent care yesterday and his son asked for them to run lab work on things that could be causing weakness. Patient is not very talkative at this time and baseline per family is normally talkative and active.   ?

## 2022-02-26 NOTE — ED Provider Notes (Signed)
?[facility] DEPT ?Provider Note ? ? ?CSN: [id number] ?Arrival date & time: 02/26/22  1136 ? ?  ? ?History ?PMH: HLD, HTN, COPD, BPH ?Chief Complaint  ?Patient presents with  ? Fatigue  ? ? ?Kenneth Woods is a 88 y.o. male. ?Patient presents with altered mental status.  He is with his son and daughter-in-law.  Per family, patient lives alone and normally takes care of himself without any help.  Over the past 3 days they have noticed he has been more fatigued, weak, and had some confusion.  They took him to urgent care yesterday and he was discharged home.  They said that this morning, they could barely get him to awake.  They brought him here today for evaluation.  They do note that he had a recent diagnosis of orchitis and urinary tract infection which she was treated for.  He states they think he finished his antibiotics because they saw an empty bottle of this as well as empty bottle of his Norco with him when they found him.  They are unsure how long the Norco bottle has been empty or if he been taking this at all.  They do note that he was able to eat yesterday, and has been tolerating p.o.  They do not think he has had any recent complaints such as chest pain, shortness of breath, abdominal pain, nausea, or vomiting.  They are unsure if he has had any recent falls.  He is not on any blood thinners.  He does have a history of COPD which is well treated. ? ?HPI ? ?  ? ?Home Medications ?Prior to Admission medications   ?Medication Sig Start Date End Date Taking? Authorizing Provider  ?albuterol (PROVENTIL HFA;VENTOLIN HFA) 108 (90 Base) MCG/ACT inhaler Inhale 2 puffs into the lungs every 6 (six) hours as needed for wheezing or shortness of breath. 12/13/15   Dhungel, Theda Belfast, MD  ?amLODipine (NORVASC) 5 MG tablet TAKE 1 TABLET BY MOUTH EVERY DAY 11/15/15   Allayne Butcher B, PA-C  ?ciprofloxacin (CIPRO) 250 MG tablet Take 1 tablet (250 mg total) by mouth every 12 (twelve) hours. 08/21/21    Linwood Dibbles, MD  ?finasteride (PROSCAR) 5 MG tablet TAKE 1 TABLET (5 MG TOTAL) BY MOUTH DAILY. 11/15/15   Dorena Bodo, PA-C  ?guaiFENesin-dextromethorphan (ROBITUSSIN DM) 100-10 MG/5ML syrup Take 5 mLs by mouth every 4 (four) hours as needed for cough. 12/13/15   Dhungel, Theda Belfast, MD  ?HYDROcodone-acetaminophen (NORCO/VICODIN) 5-325 MG tablet Take 1 tablet by mouth every 4 (four) hours as needed. 02/16/22   Jacalyn Lefevre, MD  ?levofloxacin (LEVAQUIN) 500 MG tablet Take 1 tablet (500 mg total) by mouth daily. 01/28/22   Lorre Nick, MD  ?lisinopril (PRINIVIL,ZESTRIL) 20 MG tablet TAKE 1 TABLET BY MOUTH EVERY DAY 09/29/15   Dorena Bodo, PA-C  ?nitrofurantoin, macrocrystal-monohydrate, (MACROBID) 100 MG capsule Take 1 capsule (100 mg total) by mouth 2 (two) times daily. 01/08/21   Pricilla Loveless, MD  ?OVER THE COUNTER MEDICATION Take 2 capsules by mouth 2 (two) times daily. PROSTA-STRONG (saw palmetto,lycopene,pumpkin seed extract)    [provider]  ?sulfamethoxazole-trimethoprim (BACTRIM DS) 800-160 MG tablet Take 1 tablet by mouth 2 (two) times daily for 10 days. 02/16/22 02/26/22  Jacalyn Lefevre, MD  ?tamsulosin (FLOMAX) 0.4 MG CAPS capsule TAKE ONE CAPSULE BY MOUTH EVERY DAY AFTER SUPPER 06/18/15   Dorena Bodo, PA-C  ?tamsulosin (FLOMAX) 0.4 MG CAPS capsule Take 1 capsule (0.4 mg total) by mouth daily. 08/28/20  Palumbo, April, MD  ?tamsulosin (FLOMAX) 0.4 MG CAPS capsule Take 1 capsule (0.4 mg total) by mouth daily. 12/22/20   Palumbo, April, MD  ?   ? ?Allergies    ?Penicillins   ? ?Review of Systems   ?Review of Systems  ?Constitutional:  Positive for fatigue.  ?Neurological:  Positive for weakness.  ?Psychiatric/Behavioral:  Positive for confusion.   ?All other systems reviewed and are negative. ? ?Physical Exam ?Updated Vital Signs ?BP (!) 110/52   Pulse (!) 55   Temp 98.5 ?F (36.9 ?C)   Resp 17   SpO2 93%  ?Physical Exam ?Vitals and nursing note reviewed.  ?Constitutional:   ?   General: He is  not in acute distress. ?   Appearance: Normal appearance. He is ill-appearing. He is not toxic-appearing or diaphoretic.  ?HENT:  ?   Head: Normocephalic and atraumatic.  ?   Nose: No nasal deformity.  ?   Mouth/Throat:  ?   Lips: Pink. No lesions.  ?   Mouth: Mucous membranes are dry. No injury, lacerations, oral lesions or angioedema.  ?   Pharynx: Oropharynx is clear. Uvula midline. No pharyngeal swelling, oropharyngeal exudate, posterior oropharyngeal erythema or uvula swelling.  ?   Comments: No posterior tonsillar exudates or erythema. ?Eyes:  ?   General: Gaze aligned appropriately. No scleral icterus.    ?   Right eye: No discharge.     ?   Left eye: No discharge.  ?   Conjunctiva/sclera: Conjunctivae normal.  ?   Right eye: Right conjunctiva is not injected. No exudate or hemorrhage. ?   Left eye: Left conjunctiva is not injected. No exudate or hemorrhage. ?   Pupils: Pupils are equal, round, and reactive to light.  ?   Comments: About 2 mm and reactive bilaterally.  Patient is not participate in EOM exam.  ?Cardiovascular:  ?   Rate and Rhythm: Regular rhythm. Bradycardia present.  ?   Pulses: Normal pulses.     ?     Radial pulses are 2+ on the right side and 2+ on the left side.  ?     Dorsalis pedis pulses are 2+ on the right side and 2+ on the left side.  ?   Heart sounds: Normal heart sounds, S1 normal and S2 normal. Heart sounds not distant. No murmur heard. ?  No friction rub. No gallop. No S3 or S4 sounds.  ?Pulmonary:  ?   Effort: Pulmonary effort is normal. No accessory muscle usage or respiratory distress.  ?   Breath sounds: No stridor. Wheezing present. No rhonchi or rales.  ?   Comments: Bilateral wheezes in upper respiratory fields noted.  He is not tachypneic or Shallow breathing. No respiratory distress ?Chest:  ?   Chest wall: No tenderness.  ?Abdominal:  ?   General: Abdomen is flat. There is no distension.  ?   Palpations: Abdomen is soft. There is no mass or pulsatile mass.  ?    Tenderness: There is no abdominal tenderness. There is no right CVA tenderness, left CVA tenderness, guarding or rebound.  ?   Hernia: No hernia is present.  ?Genitourinary: ?   Comments: Did not evaluate any rash, erythema, or swelling to perineal region or testicular region. ?Musculoskeletal:  ?   Right lower leg: No edema.  ?   Left lower leg: No edema.  ?Skin: ?   General: Skin is warm and dry.  ?   Coloration: Skin is not jaundiced  or pale.  ?   Findings: No bruising, erythema, lesion or rash.  ?Neurological:  ?   General: No focal deficit present.  ?   Mental Status: He is alert. He is disoriented.  ?   GCS: GCS eye subscore is 4. GCS verbal subscore is 5. GCS motor subscore is 6.  ?   Comments: Patient is alert to name.  He is only has eye-opening to pain.  Minimally following commands. GCS 2-4-5.  He is very lethargic.  Patient moves all extremities to pain.  No obvious focal deficits on exam.  Unable to participate in EOM exam.  He has round and equal reactive pupils.  No obvious gaze preference or deviation.  ?Psychiatric:     ?   Mood and Affect: Mood normal.     ?   Behavior: Behavior normal. Behavior is cooperative.  ? ? ?ED Results / Procedures / Treatments   ?Labs ?(all labs ordered are listed, but only abnormal results are displayed) ?Labs Reviewed  ?BASIC METABOLIC PANEL - Abnormal; Notable for the following components:  ?    Result Value  ? Glucose, Bld 141 (*)   ? BUN 43 (*)   ? Creatinine, Ser 1.33 (*)   ? GFR, Estimated 52 (*)   ? All other components within normal limits  ?CBC - Abnormal; Notable for the following components:  ? WBC 3.8 (*)   ? Platelets 44 (*)   ? All other components within normal limits  ?MAGNESIUM - Abnormal; Notable for the following components:  ? Magnesium 2.9 (*)   ? All other components within normal limits  ?CULTURE, BLOOD (ROUTINE X 2)  ?CULTURE, BLOOD (ROUTINE X 2)  ?URINE CULTURE  ?LACTIC ACID, PLASMA  ?LACTIC ACID, PLASMA  ?CBC WITH DIFFERENTIAL/PLATELET   ?URINALYSIS, ROUTINE W REFLEX MICROSCOPIC  ?AMMONIA  ?HEPATIC FUNCTION PANEL  ?BLOOD GAS, VENOUS  ?RAPID URINE DRUG SCREEN, HOSP PERFORMED  ?ETHANOL  ?APTT  ?PROTIME-INR  ?CBG MONITORING, ED  ?CBG MONITORING, ED  ?TROPONIN

## 2022-02-26 NOTE — ED Notes (Signed)
CRITICAL VALUE STICKER ? ?CRITICAL VALUE:Lactic acid 2.0 ? ?RECEIVER (on-site recipient of call):Shawnie Pons, RN ? ?DATE & TIME NOTIFIED:  ? ?MESSENGER (representative from lab): ? ?MD NOTIFIED: P.A. Loeffler ? ?TIME OF NOTIFICATION:1525 ? ?RESPONSE:  ? ?

## 2022-02-26 NOTE — ED Notes (Signed)
Patient transported to CT 

## 2022-02-26 NOTE — ED Notes (Signed)
Pt transported to xray via stretcher

## 2022-02-26 NOTE — ED Notes (Signed)
Patient reports he fell last night getting to the bathroom. Reports pain in the left and right hip and well as left knee. ?

## 2022-02-26 NOTE — ED Notes (Addendum)
Pt unable to sign consent to transfer due to ams, Kenneth Woods (son) at bedside agreeable with plan of care. Son would like to be called when bed becomes available.  ?

## 2022-02-27 ENCOUNTER — Inpatient Hospital Stay (HOSPITAL_COMMUNITY): Payer: Medicare Other

## 2022-02-27 ENCOUNTER — Encounter (HOSPITAL_COMMUNITY): Payer: Self-pay | Admitting: Internal Medicine

## 2022-02-27 DIAGNOSIS — N400 Enlarged prostate without lower urinary tract symptoms: Secondary | ICD-10-CM

## 2022-02-27 DIAGNOSIS — I7410 Embolism and thrombosis of unspecified parts of aorta: Secondary | ICD-10-CM

## 2022-02-27 DIAGNOSIS — R339 Retention of urine, unspecified: Secondary | ICD-10-CM

## 2022-02-27 DIAGNOSIS — D72819 Decreased white blood cell count, unspecified: Secondary | ICD-10-CM | POA: Diagnosis not present

## 2022-02-27 DIAGNOSIS — I10 Essential (primary) hypertension: Secondary | ICD-10-CM

## 2022-02-27 DIAGNOSIS — N179 Acute kidney failure, unspecified: Secondary | ICD-10-CM

## 2022-02-27 DIAGNOSIS — M79662 Pain in left lower leg: Secondary | ICD-10-CM

## 2022-02-27 DIAGNOSIS — M79661 Pain in right lower leg: Secondary | ICD-10-CM | POA: Diagnosis not present

## 2022-02-27 DIAGNOSIS — N433 Hydrocele, unspecified: Secondary | ICD-10-CM

## 2022-02-27 DIAGNOSIS — G9340 Encephalopathy, unspecified: Secondary | ICD-10-CM | POA: Diagnosis not present

## 2022-02-27 DIAGNOSIS — M79605 Pain in left leg: Secondary | ICD-10-CM | POA: Diagnosis not present

## 2022-02-27 DIAGNOSIS — N453 Epididymo-orchitis: Secondary | ICD-10-CM

## 2022-02-27 DIAGNOSIS — N452 Orchitis: Secondary | ICD-10-CM | POA: Diagnosis present

## 2022-02-27 DIAGNOSIS — R1013 Epigastric pain: Secondary | ICD-10-CM | POA: Diagnosis not present

## 2022-02-27 DIAGNOSIS — M79604 Pain in right leg: Secondary | ICD-10-CM

## 2022-02-27 DIAGNOSIS — D696 Thrombocytopenia, unspecified: Secondary | ICD-10-CM | POA: Diagnosis not present

## 2022-02-27 DIAGNOSIS — R109 Unspecified abdominal pain: Secondary | ICD-10-CM | POA: Diagnosis present

## 2022-02-27 DIAGNOSIS — J449 Chronic obstructive pulmonary disease, unspecified: Secondary | ICD-10-CM

## 2022-02-27 DIAGNOSIS — D65 Disseminated intravascular coagulation [defibrination syndrome]: Secondary | ICD-10-CM

## 2022-02-27 DIAGNOSIS — N451 Epididymitis: Secondary | ICD-10-CM

## 2022-02-27 LAB — ABO/RH: ABO/RH(D): O POS

## 2022-02-27 LAB — CBC
HCT: 34.1 % — ABNORMAL LOW (ref 39.0–52.0)
Hemoglobin: 11.6 g/dL — ABNORMAL LOW (ref 13.0–17.0)
MCH: 29.5 pg (ref 26.0–34.0)
MCHC: 34 g/dL (ref 30.0–36.0)
MCV: 86.8 fL (ref 80.0–100.0)
Platelets: 39 10*3/uL — ABNORMAL LOW (ref 150–400)
RBC: 3.93 MIL/uL — ABNORMAL LOW (ref 4.22–5.81)
RDW: 14.1 % (ref 11.5–15.5)
WBC: 3 10*3/uL — ABNORMAL LOW (ref 4.0–10.5)
nRBC: 0 % (ref 0.0–0.2)

## 2022-02-27 LAB — RETICULOCYTES
Immature Retic Fract: 12.5 % (ref 2.3–15.9)
Immature Retic Fract: 10.2 % (ref 2.3–15.9)
RBC.: 4.72 MIL/uL (ref 4.22–5.81)
RBC.: 3.93 MIL/uL — ABNORMAL LOW (ref 4.22–5.81)
Retic Count, Absolute: 34.9 10*3/uL (ref 19.0–186.0)
Retic Count, Absolute: 29.5 10*3/uL (ref 19.0–186.0)
Retic Ct Pct: 0.7 % (ref 0.4–3.1)
Retic Ct Pct: 0.8 % (ref 0.4–3.1)

## 2022-02-27 LAB — COMPREHENSIVE METABOLIC PANEL
ALT: 32 U/L (ref 0–44)
AST: 47 U/L — ABNORMAL HIGH (ref 15–41)
Albumin: 2.4 g/dL — ABNORMAL LOW (ref 3.5–5.0)
Alkaline Phosphatase: 57 U/L (ref 38–126)
Anion gap: 7 (ref 5–15)
BUN: 35 mg/dL — ABNORMAL HIGH (ref 8–23)
CO2: 20 mmol/L — ABNORMAL LOW (ref 22–32)
Calcium: 8.5 mg/dL — ABNORMAL LOW (ref 8.9–10.3)
Chloride: 107 mmol/L (ref 98–111)
Creatinine, Ser: 0.97 mg/dL (ref 0.61–1.24)
GFR, Estimated: >60 mL/min (ref >60)
Glucose, Bld: 97 mg/dL (ref 70–99)
Potassium: 4.4 mmol/L (ref 3.5–5.1)
Sodium: 134 mmol/L — ABNORMAL LOW (ref 135–145)
Total Bilirubin: 0.5 mg/dL (ref 0.3–1.2)
Total Protein: 5.2 g/dL — ABNORMAL LOW (ref 6.5–8.1)

## 2022-02-27 LAB — TYPE AND SCREEN
ABO/RH(D): O POS
Antibody Screen: NEGATIVE

## 2022-02-27 LAB — DIC (DISSEMINATED INTRAVASCULAR COAGULATION)PANEL
D-Dimer, Quant: 11.04 ug/mL-FEU — ABNORMAL HIGH (ref 0.00–0.50)
Fibrinogen: 131 mg/dL — ABNORMAL LOW (ref 210–475)
INR: 1.2 (ref 0.8–1.2)
Platelets: 39 10*3/uL — ABNORMAL LOW (ref 150–400)
Prothrombin Time: 15 seconds (ref 11.4–15.2)
Smear Review: NONE SEEN
aPTT: 34 seconds (ref 24–36)

## 2022-02-27 LAB — MRSA NEXT GEN BY PCR, NASAL: MRSA by PCR Next Gen: NOT DETECTED

## 2022-02-27 LAB — TSH: TSH: 0.722 u[IU]/mL (ref 0.350–4.500)

## 2022-02-27 LAB — SAVE SMEAR(SSMR), FOR PROVIDER SLIDE REVIEW

## 2022-02-27 LAB — LACTATE DEHYDROGENASE: LDH: 220 U/L — ABNORMAL HIGH (ref 98–192)

## 2022-02-27 LAB — RESP PANEL BY RT-PCR (FLU A&B, COVID) ARPGX2
Influenza A by PCR: NEGATIVE
Influenza B by PCR: NEGATIVE
SARS Coronavirus 2 by RT PCR: NEGATIVE

## 2022-02-27 LAB — LACTIC ACID, PLASMA
Lactic Acid, Venous: 2.5 mmol/L (ref 0.5–1.9)
Lactic Acid, Venous: 1.7 mmol/L (ref 0.5–1.9)

## 2022-02-27 LAB — CK: Total CK: 103 U/L (ref 49–397)

## 2022-02-27 LAB — VITAMIN B12: Vitamin B-12: 683 pg/mL (ref 180–914)

## 2022-02-27 IMAGING — US US SCROTUM W/ DOPPLER COMPLETE
1 series · 13 of 25 positions shown · non-contrast
Comparison: [DATE] and [DATE]

CLINICAL DATA: Orchitis and epididymitis

EXAM:
SCROTAL ULTRASOUND
DOPPLER ULTRASOUND OF THE TESTICLES
TECHNIQUE: Complete ultrasound examination of the testicles, epididymis, and
other scrotal structures was performed. Color and spectral Doppler
ultrasound were also utilized to evaluate blood flow to the
testicles.

[Series 1: us scrotum w/doppler · 13 of 93 slices shown]
[im 1/93]
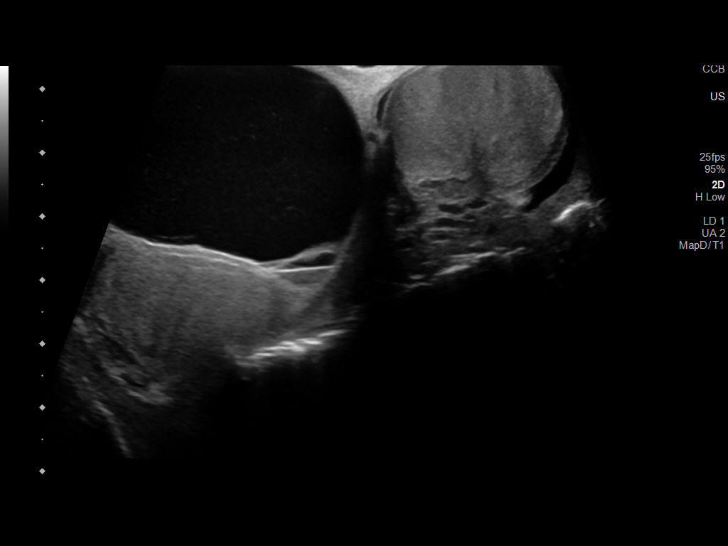
[im 8/93]
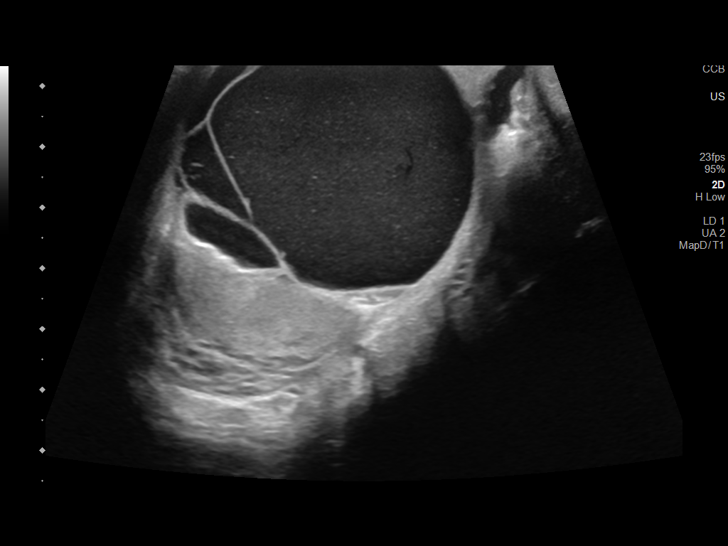
[im 16/93]
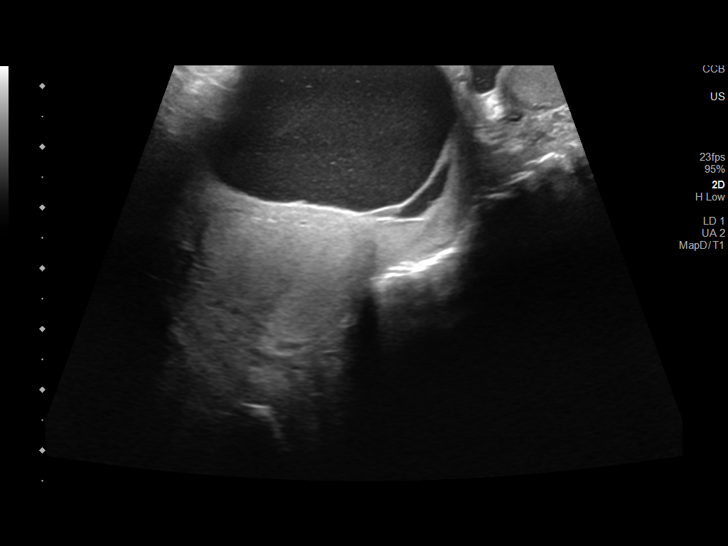
[im 24/93]
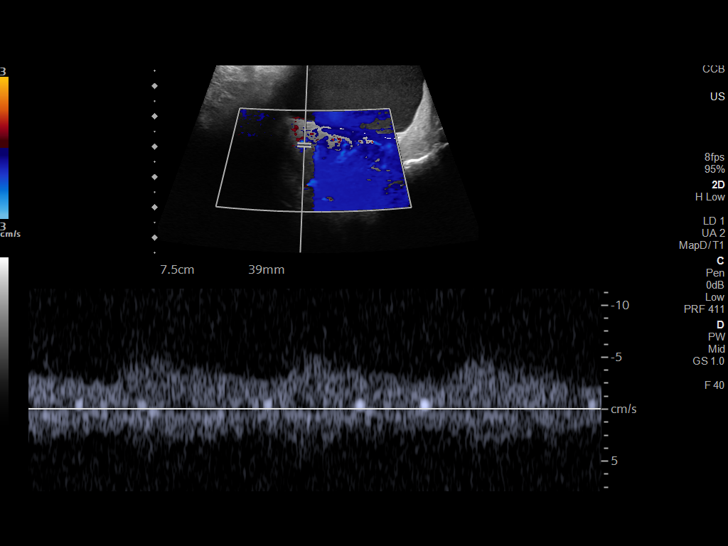
[im 31/93]
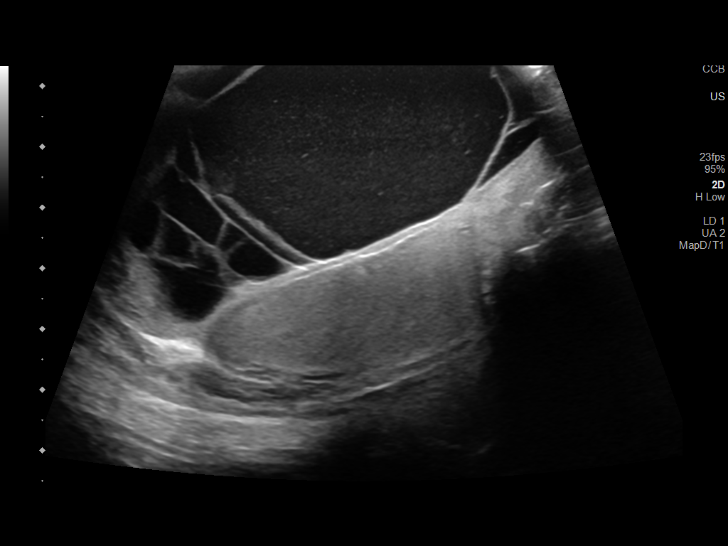
[im 39/93]
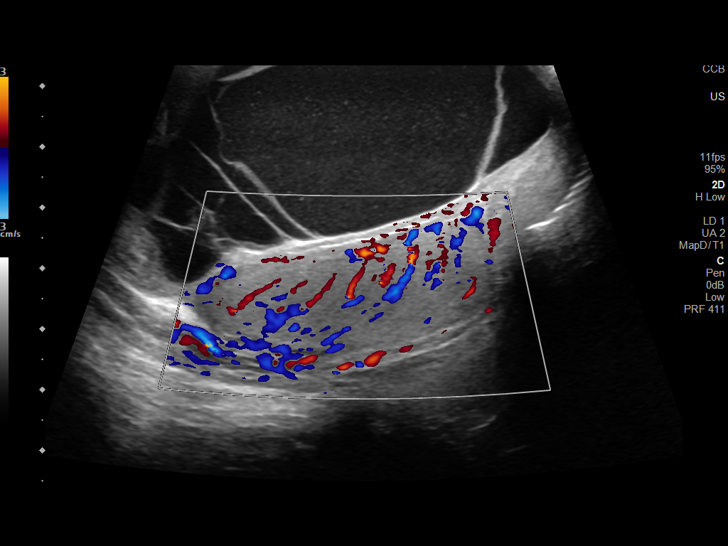
[im 47/93]
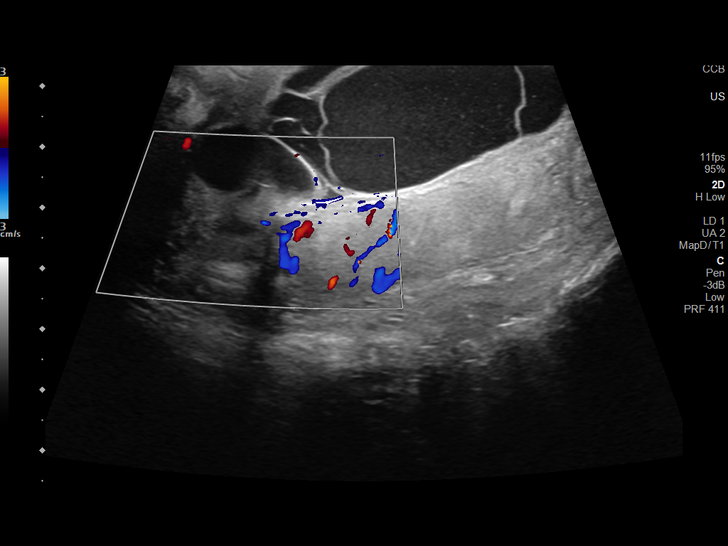
[im 54/93]
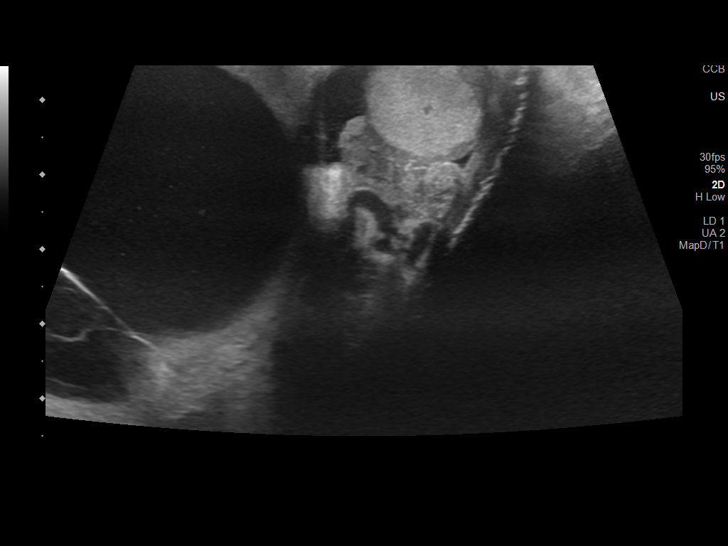
[im 62/93]
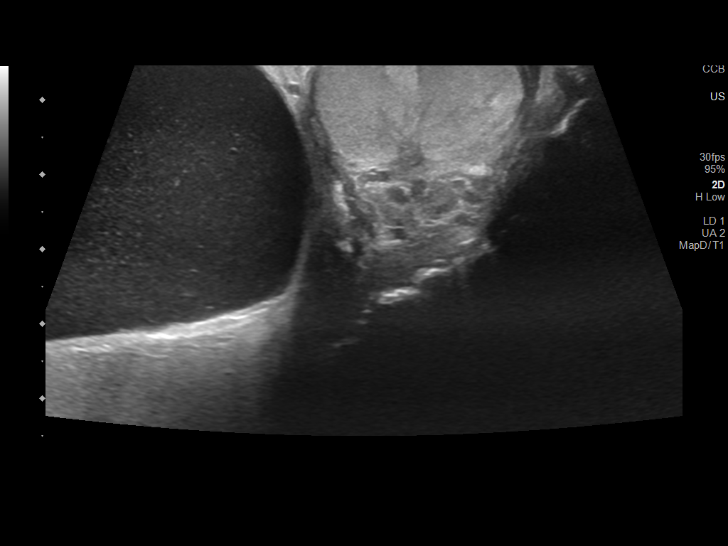
[im 70/93]
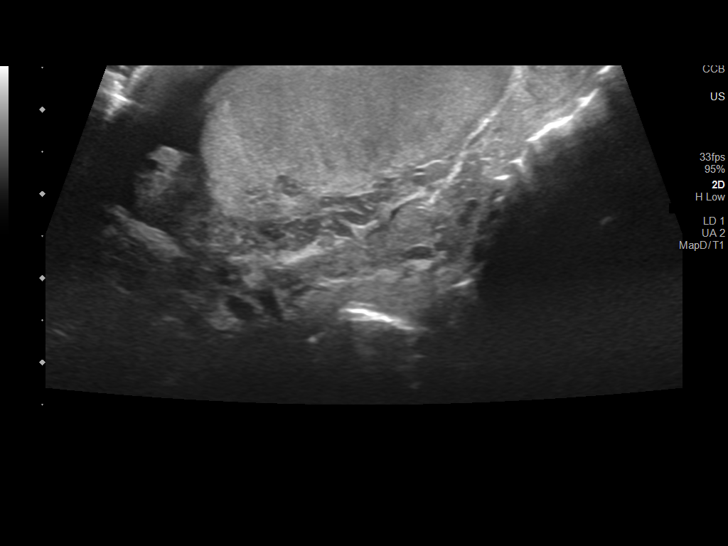
[im 77/93]
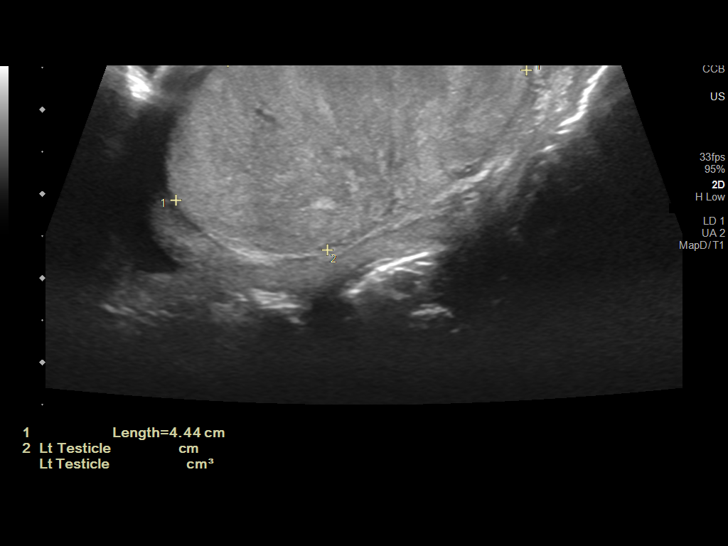
[im 85/93]
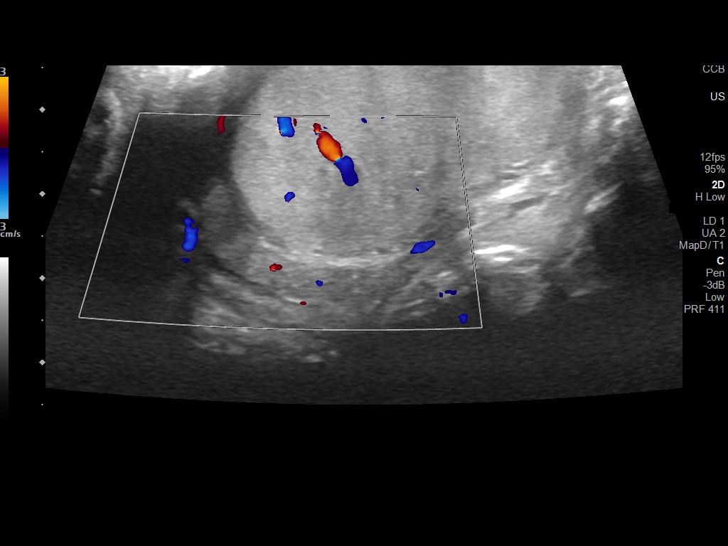
[im 93/93]
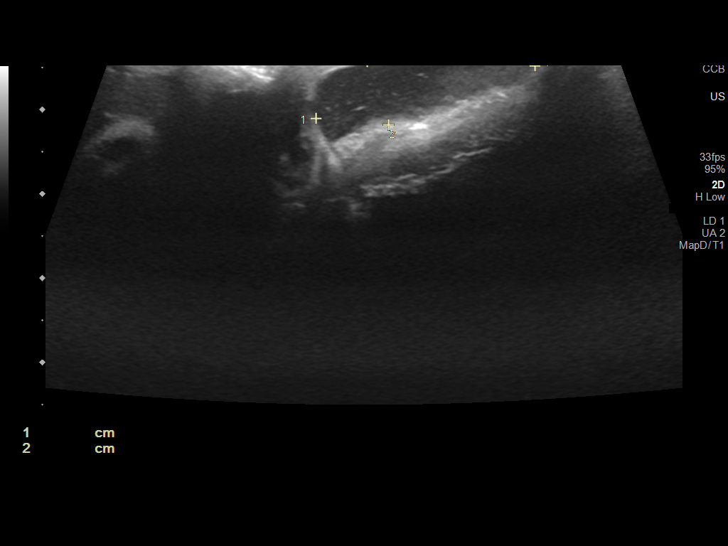

[13 of 25 positions shown; findings below may reference images not displayed]

FINDINGS: Right testicle

Measurements: 4.8 x 1.8 x 3.5 cm. Mass effect on the right testis
due to the right scrotal collection (described above), grossly
unchanged. No mass or microlithiasis visualized.

Left testicle

Measurements: 4.4 x 2.5 x 2.7 cm. Stable heterogeneous parenchymal
appearance. No mass or microlithiasis visualized.

Right epididymis:  Poorly visualized due to mass effect.

Left epididymis:  Normal in size and appearance.

Hydrocele: Present bilaterally, right greater than left. Septated
right scrotal collection measures 7.2 x 3.5 x 6.1 cm, previously
x 4.4 x 5.1 cm, grossly unchanged.

Varicocele:  None visualized.

Pulsed Doppler interrogation of both testes demonstrates a normal
low resistance arterial and venous waveforms on the left, with a
sluggish/diminished arterial peak on the right secondary to mass
effect, although patent.
IMPRESSION: No evidence of testicular torsion.

Stable complex right hydrocele with mass effect on the right testis.

## 2022-02-27 IMAGING — US US SCROTUM W/ DOPPLER COMPLETE
1 series · 1 of 1 positions shown · non-contrast
Comparison: [DATE] and [DATE]

CLINICAL DATA: Orchitis and epididymitis

EXAM:
SCROTAL ULTRASOUND
DOPPLER ULTRASOUND OF THE TESTICLES
TECHNIQUE: Complete ultrasound examination of the testicles, epididymis, and
other scrotal structures was performed. Color and spectral Doppler
ultrasound were also utilized to evaluate blood flow to the
testicles.

[Series 1: us scrotum w/doppler · 1 of 1 slices shown]
[im 1/1]
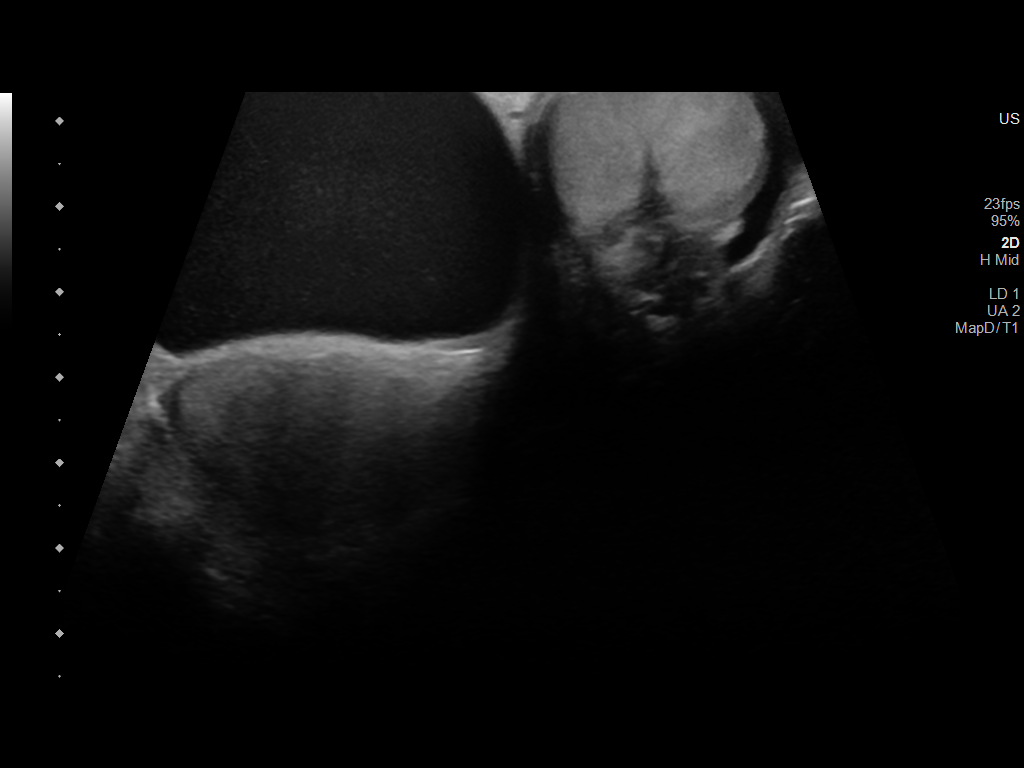

[1 of 1 positions shown; findings below may reference images not displayed]

FINDINGS: Right testicle

Measurements: 4.8 x 1.8 x 3.5 cm. Mass effect on the right testis
due to the right scrotal collection (described above), grossly
unchanged. No mass or microlithiasis visualized.

Left testicle

Measurements: 4.4 x 2.5 x 2.7 cm. Stable heterogeneous parenchymal
appearance. No mass or microlithiasis visualized.

Right epididymis:  Poorly visualized due to mass effect.

Left epididymis:  Normal in size and appearance.

Hydrocele: Present bilaterally, right greater than left. Septated
right scrotal collection measures 7.2 x 3.5 x 6.1 cm, previously
x 4.4 x 5.1 cm, grossly unchanged.

Varicocele:  None visualized.

Pulsed Doppler interrogation of both testes demonstrates a normal
low resistance arterial and venous waveforms on the left, with a
sluggish/diminished arterial peak on the right secondary to mass
effect, although patent.
IMPRESSION: No evidence of testicular torsion.

Stable complex right hydrocele with mass effect on the right testis.

## 2022-02-27 MED ORDER — CHLORHEXIDINE GLUCONATE CLOTH 2 % EX PADS
6.0000 | MEDICATED_PAD | Freq: Every day | CUTANEOUS | Status: DC
  Administered 2022-02-27 – 2022-03-07 (×9): 6 via TOPICAL

## 2022-02-27 MED ORDER — FINASTERIDE 5 MG PO TABS
5.0000 mg | ORAL_TABLET | Freq: Every day | ORAL | Status: DC
  Administered 2022-02-27 – 2022-03-07 (×8): 5 mg via ORAL
  Filled 2022-02-27 (×9): qty 1

## 2022-02-27 MED ORDER — SODIUM CHLORIDE 0.9 % IV SOLN
2.0000 g | INTRAVENOUS | Status: AC
  Administered 2022-02-27 – 2022-03-04 (×6): 2 g via INTRAVENOUS
  Filled 2022-02-27 (×7): qty 20

## 2022-02-27 MED ORDER — ONDANSETRON HCL 4 MG PO TABS: 4.0000 mg | ORAL_TABLET | Freq: Four times a day (QID) | ORAL | Status: DC | PRN

## 2022-02-27 MED ORDER — TAMSULOSIN HCL 0.4 MG PO CAPS
0.4000 mg | ORAL_CAPSULE | Freq: Every day | ORAL | Status: DC
  Administered 2022-02-27 – 2022-03-07 (×8): 0.4 mg via ORAL
  Filled 2022-02-27 (×9): qty 1

## 2022-02-27 MED ORDER — ONDANSETRON HCL 4 MG/2ML IJ SOLN
4.0000 mg | Freq: Four times a day (QID) | INTRAMUSCULAR | Status: DC | PRN
  Administered 2022-03-03: 4 mg via INTRAVENOUS
  Filled 2022-02-27: qty 2

## 2022-02-27 MED ORDER — SODIUM CHLORIDE 0.9 % IV BOLUS
500.0000 mL | Freq: Once | INTRAVENOUS | Status: AC
  Administered 2022-02-27: 500 mL via INTRAVENOUS

## 2022-02-27 NOTE — Progress Notes (Addendum)
?      ?                 PROGRESS NOTE ? ?      ?PATIENT DETAILS ?Name: Kenneth Woods ?Age: 88 y.o. ?Sex: male ?Date of Birth: 01/29/1934 ?Admit Date: 02/26/2022 ?Admitting Physician Mckinley Jewel, MD ?[id number], Cammie Mcgee, MD ? ?Brief Summary: ?Patient is a 88 y.o.  male with history of BPH, recent history of right epididymitis-has completed a course of Levaquin-most recently placed on Bactrim on 5/4 ED visit-presented to the hospital with altered mental status-subsequently admitted to the hospitalist service for further evaluation and treatment. ? ?Significant events: ?5/14>> admit to [facility] for AMS/leukopenia/thrombocytopenia.  Acute urinary retention-requiring Foley catheter insertion. ? ?Significant studies: ?5/14>> CT head: No acute findings. ?5/14>> CT abdomen/pelvis: No acute findings-marked prostate hypertrophy/chronic bladder outlet obstruction. ?5/14>> x-ray left knee: No fracture/dislocation ?5/14>> x-ray hip/pelvis: No fracture/dislocation ?5/14>> CXR: No pneumonia ?5/15>> ultrasound scrotum: No torsion-stable complex right hydrocele with mass effect on the right testes. ? ?Significant microbiology data: ?5/14>> blood culture: No growth ?5/14>> urine culture: Pending ?5/15>> COVID/influenza PCR: Negative ?5/15>> hydrocele culture: Pending ? ?Procedures: ?5/15>> bedside aspiration from right hydrocele by urology ? ?Consults: ?Urology, hematology ? ?Subjective: ?Awakes to all loud verbal stimuli-able to tell me his name but otherwise is confused. ? ?Objective: ?Vitals: ?Blood pressure (!) 113/47, pulse 63, temperature 99 ?F (37.2 ?C), temperature source Oral, resp. rate 20, height 5\' 5"  (1.651 m), weight 62.2 kg, SpO2 91 %.  ? ?Exam: ?Gen Exam:not in any distress ?HEENT:atraumatic, normocephalic ?Chest: B/L clear to auscultation anteriorly ?CVS:S1S2 regular ?Abdomen:soft non tender, non distended ?Extremities:no edema ?Neurology: Non focal ?Skin: no rash ? ?Pertinent Labs/Radiology: ? ?  Latest Ref Rng &  Units 02/27/2022  ?  4:08 AM 02/26/2022  ? 11:14 PM 02/26/2022  ?  3:03 PM  ?CBC  ?WBC 4.0 - 10.5 K/uL 3.0      ?Hemoglobin 13.0 - 17.0 g/dL 11.6    14.3    ?Hematocrit 39.0 - 52.0 % 34.1    42.0    ?Platelets 150 - 400 K/uL 39   39     ?  ?Lab Results  ?Component Value Date  ? NA 134 (L) 02/27/2022  ? K 4.4 02/27/2022  ? CL 107 02/27/2022  ? CO2 20 (L) 02/27/2022  ?  ? ? ?Assessment/Plan: ?Acute metabolic encephalopathy: Likely metabolic etiology-although sepsis is a consideration-he does not have any overt sepsis physiology-encephalopathy seems somewhat disproportionate to any sort of infectious issues present.  Obtain MRI brain-EEG-we will continue antibiotics and follow for the next few days if-if no improvement-May need neurology evaluation at some point. ? ?Leukopenia/thrombocytopenia: Possibly from Bactrim use-possibly sepsis but no overt/florid features of sepsis evident at this point.  Not felt to have TTP-Dr. Benay Spice will formally evaluate and provide further recommendations. ? ?Recurrent right epididymitis-reactive hydrocele: Apparently has been on numerous oral antibiotics in the outpatient setting (2 courses of Levaquin-and recently started on Bactrim on 5/4)-s/p aspiration of hydrocele earlier today-awaiting cultures.  We will stop vancomycin/cefepime and narrow to Rocephin. ? ?Possible fluid collection in the bulbar urethra: See urology note-obtaining MRI pelvis to rule out possible abscess.  No other sources of sepsis apparent at this point. ? ?AKI: Mild-resolved-probably hemodynamic related kidney injury and from acute urinary retention. ? ?Acute urinary retention: S/p Foley catheter inserted in the ED-given severe encephalopathy-continue Foley catheter.  Already on finasteride/Flomax for history of BPH. ? ?BPH: Continue finasteride/Flomax ? ?Bilateral calf pain:  Doubt DVT but Dopplers pending. ? ?HTN: BP stable without the use of any antihypertensives-follow-up. ? ?3.4 cm abdominal aortic aneurysm:  Defer further to the outpatient setting ? ?BMI: ?Estimated body mass index is 22.82 kg/m? as calculated from the following: ?  Height as of this encounter: 5\' 5"  (1.651 m). ?  Weight as of this encounter: 62.2 kg.  ? ?Code status: ?  Code Status: Full Code  ? ?DVT Prophylaxis: ?SCDs Start: 02/27/22 0102 ?  ?Family Communication: Ronnell Freshwater [phone]-updated over the phone 5/15 ? ? ?Disposition Plan: ?Status is: Inpatient ?Remains inpatient appropriate because: Severe encephalopathy-not yet at baseline-we will require several days of hospitalization. ?  ?Planned Discharge Destination:Skilled nursing facility vs SNF ? ? ?Diet: ?Diet Order   ? ?       ?  Diet NPO time specified  Diet effective now       ?  ? ?  ?  ? ?  ?  ? ? ?Antimicrobial agents: ?Anti-infectives (From admission, onward)  ? ? Start     Dose/Rate Route Frequency Ordered Stop  ? 02/27/22 1700  vancomycin (VANCOREADY) IVPB 750 mg/150 mL       ? 750 mg ?150 mL/hr over 60 Minutes Intravenous Every 24 hours 02/26/22 1638    ? 02/26/22 2200  ceFEPIme (MAXIPIME) 2 g in sodium chloride 0.9 % 100 mL IVPB       ? 2 g ?200 mL/hr over 30 Minutes Intravenous Every 12 hours 02/26/22 1618    ? 02/26/22 1615  vancomycin (VANCOCIN) IVPB 1000 mg/200 mL premix       ? 1,000 mg ?200 mL/hr over 60 Minutes Intravenous  Once 02/26/22 1601 02/26/22 1811  ? ?  ? ? ? ?MEDICATIONS: ?Scheduled Meds: ?Continuous Infusions: ? sodium chloride 100 mL/hr at 02/27/22 0819  ? ceFEPime (MAXIPIME) IV 2 g (02/27/22 [id number])  ? vancomycin    ? ?PRN Meds:.albuterol, ondansetron **OR** ondansetron (ZOFRAN) IV ? ? ?I have personally reviewed following labs and imaging studies ? ?LABORATORY DATA: ?CBC: ?Recent Labs  ?Lab 02/26/22 ?1207 02/26/22 ?1401 02/26/22 ?1503 02/26/22 ?2314 02/27/22 ?0408  ?WBC 3.8* 3.2*  --   --  3.0*  ?NEUTROABS  --  2.4  --   --   --   ?HGB 14.0 13.9 14.3  --  11.6*  ?HCT 43.0 42.3 42.0  --  34.1*  ?MCV 86.9 87.0  --   --  86.8  ?PLT 44* 42*  --  39* 39*   ? ? ?Basic Metabolic Panel: ?Recent Labs  ?Lab 02/26/22 ?1207 02/26/22 ?1503 02/27/22 ?0123  ?NA 135 134* 134*  ?K 4.4 4.3 4.4  ?CL 98  --  107  ?CO2 30  --  20*  ?GLUCOSE 141*  --  97  ?BUN 43*  --  35*  ?CREATININE 1.33*  --  0.97  ?CALCIUM 9.4  --  8.5*  ?MG 2.9*  --   --   ? ? ?GFR: ?Estimated Creatinine Clearance: 47.6 mL/min (by C-G formula based on SCr of 0.97 mg/dL). ? ?Liver Function Tests: ?Recent Labs  ?Lab 02/26/22 ?1401 02/27/22 ?0123  ?AST 47* 47*  ?ALT 33 32  ?ALKPHOS 64 57  ?BILITOT 0.5 0.5  ?PROT 6.2* 5.2*  ?ALBUMIN 3.4* 2.4*  ? ?No results for input(s): LIPASE, AMYLASE in the last 168 hours. ?Recent Labs  ?Lab 02/26/22 ?1401  ?AMMONIA 33  ? ? ?Coagulation Profile: ?Recent Labs  ?Lab 02/26/22 ?1401 02/26/22 ?2314  ?INR 1.1 1.2  ? ? ?  Cardiac Enzymes: ?Recent Labs  ?Lab 02/27/22 ?0123  ?CKTOTAL 103  ? ? ?BNP (last 3 results) ?No results for input(s): PROBNP in the last 8760 hours. ? ?Lipid Profile: ?No results for input(s): CHOL, HDL, LDLCALC, TRIG, CHOLHDL, LDLDIRECT in the last 72 hours. ? ?Thyroid Function Tests: ?Recent Labs  ?  02/27/22 ?0123  ?TSH 0.722  ? ? ?Anemia Panel: ?Recent Labs  ?  02/27/22 ?0123 02/27/22 ?0408  ?RETICCTPCT 0.7 0.8  ? ? ?Urine analysis: ?   ?Component Value Date/Time  ? COLORURINE YELLOW 02/26/2022 1401  ? APPEARANCEUR CLEAR 02/26/2022 1401  ? LABSPEC 1.022 02/26/2022 1401  ? PHURINE 5.5 02/26/2022 1401  ? GLUCOSEU NEGATIVE 02/26/2022 1401  ? HGBUR MODERATE (A) 02/26/2022 1401  ? [place] NEGATIVE 02/26/2022 1401  ? [place] NEGATIVE 02/26/2022 1401  ? PROTEINUR 30 (A) 02/26/2022 1401  ? UROBILINOGEN 0.2 06/20/2013 2028  ? NITRITE NEGATIVE 02/26/2022 1401  ? LEUKOCYTESUR NEGATIVE 02/26/2022 1401  ? ? ?Sepsis Labs: ?Lactic Acid, Venous ?   ?Component Value Date/Time  ? LATICACIDVEN 1.7 02/27/2022 0408  ? ? ?MICROBIOLOGY: ?Recent Results (from the past 240 hour(s))  ?Culture, blood (Routine X 2) w Reflex to ID Panel     Status: None (Preliminary result)  ? Collection  Time: 02/26/22  4:05 PM  ? Specimen: BLOOD LEFT FOREARM  ?Result Value Ref Range Status  ? Specimen Description   Final  ?  BLOOD LEFT FOREARM ?Performed at [facility] [place]

## 2022-02-27 NOTE — Assessment & Plan Note (Addendum)
Acute thrombocytopenia, wasn't present on labs 10 days ago. ?Consumption of platelets? ?1. TTP considered but: ?1. No microangiopathic hemolytic anemia, ?2. HGB 14 ?3. No schistiocytes on smear ?2. Likewise DIC also considered, ?1. DOES have low fibrinogen ?3. Check [place] BLE to r/o DVT ?4. Curbsided Dr. Benay Spice: ?1. Hes actually suspicious of infection + DIC being the cause here, TTP felt less likely ?2. Save smear ?3. Check LDH ?4. Check retic count ?

## 2022-02-27 NOTE — Assessment & Plan Note (Signed)
Pt with recent history of epididymitis and orchitis.  Had failed outpt ABx as of 5/4 and was worsening on ED eval at that time.  Decided to try different course of outpt ABx (see EDP note at that time). ?1. Testicle mildly red today on exam but not really that impressive to me ?2. Never the less, given history, given AMS and ? Of sepsis, will obtain US scrotum ?3. Cont ABx as above ?

## 2022-02-27 NOTE — Assessment & Plan Note (Signed)
Pt with epigastric, RUQ TTP, and suprapubic TTP. ?Has guarding on exam. ?Has negative CT AP w/o contrast earlier this evening. ?Symptoms persist despite foley placement and >600cc UOP ?1. ? Ischemic bowel ?1. Repeat lactic acid now ?

## 2022-02-27 NOTE — Consult Note (Signed)
?Subjective: ?  ? ?Consult requested by Dr. Jeoffrey Massed. ? ?Mr. Guard is an 88 yo male who is followed in our office by Dr. Benancio Deeds for a history of BPH with BOO and prior retention  with an elevated PSA.  He is chronically on tamsulosin and finasteride.  He was last seen on 02/09/22 with right testicular pain and swelling in f/u from an ER visit.  He had an infected appearing UA in the ER but no culture was done and he was treated with Levaquin.  He was admitted through the ER last night with signs of sepsis and has had some rapid enlargement of the right scrotum but otherwise stable findings on [place].  He was found to be in retention with on bladder scan and a foley was placed successfully.  He had a CT that showed no other cause of sepsis but on my review there is a low density ovoid density in the area of the bulbar urethra that wasn't noted by radiology.   He has been seen by Dr. Truett Perna for thrombocytopenia that is felt to be from the sepsis and not TTP.   The patient is obtunded this morning but is arousable but unable to contribute any history.    He is currently on cefipime and vancomycin.   ?ROS: ? ?Review of Systems  ?Unable to perform ROS: Mental status change  ? ?Allergies  ?Allergen Reactions  ? Penicillins Anaphylaxis  ? ? ?Past Medical History:  ?Diagnosis Date  ? Arthritis   ? Back pain, chronic   ? BPH (benign prostatic hypertrophy) with urinary obstruction   ? COPD (chronic obstructive pulmonary disease) (HCC)   ? Elevated PSA 10/16/2010  ? H/O prostate biopsy   ? Hemorrhoids, external   ? Hyperlipidemia   ? Hypertension   ? ? ?Past Surgical History:  ?Procedure Laterality Date  ? FRACTURE SURGERY  1959  ? Work accident-caused FX whole body expect rt arm,was in body cast- was working on roof- hosp for 5 mos.  ? INGUINAL HERNIA REPAIR Bilateral 1983  ? PROSTATE BIOPSY N/A 06/20/2013  ? Procedure: PROSTATE BIOPSY;  Surgeon: Garnett Farm, MD;  Location: [facility];  Service:  Urology;  Laterality: N/A;  1 hour req for this case ? ?ULTRASOUND EQUIPHMENT TO BE PROVIDED BY [facility]  ? ? ?Social History  ? ?Socioeconomic History  ? Marital status: Widowed  ?  Spouse name: Not on file  ? Number of children: Not on file  ? Years of education: Not on file  ? Highest education level: Not on file  ?Occupational History  ? Not on file  ?Tobacco Use  ? Smoking status: Former  ?  Packs/day: 1.00  ?  Years: 30.00  ?  Pack years: 30.00  ?  Types: Cigarettes  ?  Quit date: 06/09/2005  ?  Years since quitting: 16.7  ? Smokeless tobacco: Never  ?Vaping Use  ? Vaping Use: Never used  ?Substance and Sexual Activity  ? Alcohol use: Yes  ?  Comment: OCCASIONAL  ? Drug use: No  ? Sexual activity: Not Currently  ?Other Topics Concern  ? Not on file  ?Social History Narrative  ? Not on file  ? ?Social Determinants of Health  ? ?Financial Resource Strain: Not on file  ?Food Insecurity: Not on file  ?Transportation Needs: Not on file  ?Physical Activity: Not on file  ?Stress: Not on file  ?Social Connections: Not on file  ?Intimate Partner Violence:  Not on file  ? ? ?Family History  ?Problem Relation Age of Onset  ? CVA Mother   ? Cancer Father   ?     brain tumor  ? ? ?Anti-infectives: ?Anti-infectives (From admission, onward)  ? ? Start     Dose/Rate Route Frequency Ordered Stop  ? 02/27/22 1700  vancomycin (VANCOREADY) IVPB 750 mg/150 mL       ? 750 mg ?150 mL/hr over 60 Minutes Intravenous Every 24 hours 02/26/22 1638    ? 02/26/22 2200  ceFEPIme (MAXIPIME) 2 g in sodium chloride 0.9 % 100 mL IVPB       ? 2 g ?200 mL/hr over 30 Minutes Intravenous Every 12 hours 02/26/22 1618    ? 02/26/22 1615  vancomycin (VANCOCIN) IVPB 1000 mg/200 mL premix       ? 1,000 mg ?200 mL/hr over 60 Minutes Intravenous  Once 02/26/22 1601 02/26/22 1811  ? ?  ? ? ?Current Facility-Administered Medications  ?Medication Dose Route Frequency Provider Last Rate Last Admin  ? 0.9 %  sodium chloride infusion   Intravenous  Continuous Lyda Perone M, DO 100 mL/hr at 02/27/22 0600 Infusion Verify at 02/27/22 0600  ? albuterol (VENTOLIN HFA) 108 (90 Base) MCG/ACT inhaler 2 puff  2 puff Inhalation Q6H PRN Loeffler, Grace C, PA-C      ? ceFEPIme (MAXIPIME) 2 g in sodium chloride 0.9 % 100 mL IVPB  2 g Intravenous Q12H Sampson Si, RPH   Stopped at 02/26/22 2228  ? ondansetron (ZOFRAN) tablet 4 mg  4 mg Oral Q6H PRN Hillary Bow, DO      ? Or  ? ondansetron (ZOFRAN) injection 4 mg  4 mg Intravenous Q6H PRN Hillary Bow, DO      ? vancomycin (VANCOREADY) IVPB 750 mg/150 mL  750 mg Intravenous Q24H Mancheril, Candis Schatz, RPH      ? ? ? ?Objective: ?Vital signs in last 24 hours: ?BP (!) 114/47   Pulse 62   Temp 99 ?F (37.2 ?C) (Oral)   Resp (!) 21   Ht 5\' 5"  (1.651 m)   Wt 62.2 kg   SpO2 91%   BMI 22.82 kg/m?  ? ?Intake/Output from previous day: ?05/14 0701 - 05/15 0700 ?In: 3850.3 [I.V.:749.6; IV Piggyback:3100.8] ?Out: 375 [Urine:375] ?Intake/Output this shift: ?No intake/output data recorded. ? ? ?Physical Exam ?Vitals reviewed.  ?Constitutional:   ?   Appearance: Normal appearance. He is ill-appearing.  ?Abdominal:  ?   General: Abdomen is flat.  ?   Palpations: Abdomen is soft. There is no mass.  ?   Tenderness: There is no abdominal tenderness.  ?   Hernia: No hernia is present.  ?Genitourinary: ?   Comments: Normal phallus with foley at the meatus. ?Scrotum with a 5-6cm hydrocele on the right with no erythema and only mild tenderness. Left normal. ?Testes.  Left normal with normal epididymis.   Right minimally enlarged with minimal tenderness with some enlargement and tenderness of the epididymis. ( This was after the hydrocele was aspirated.) ?AP without lesions.  NST without mass. ?Prostate 3+ without nodules. No periurethral mass noted.  Foley palpable.  ?Musculoskeletal:     ?   General: No swelling. Normal range of motion.  ?Skin: ?   General: Skin is warm and dry.  ?Neurological:  ?   Mental Status: He  is disoriented.  ? ? ?Lab Results:  ?Results for orders placed or performed during the hospital encounter of 02/26/22 (from  the past 24 hour(s))  ?Lactic acid, plasma     Status: None  ? Collection Time: 02/26/22 12:06 PM  ?Result Value Ref Range  ? Lactic Acid, Venous 1.6 0.5 - 1.9 mmol/L  ?Basic metabolic panel     Status: Abnormal  ? Collection Time: 02/26/22 12:07 PM  ?Result Value Ref Range  ? Sodium 135 135 - 145 mmol/L  ? Potassium 4.4 3.5 - 5.1 mmol/L  ? Chloride 98 98 - 111 mmol/L  ? CO2 30 22 - 32 mmol/L  ? Glucose, Bld 141 (H) 70 - 99 mg/dL  ? BUN 43 (H) 8 - 23 mg/dL  ? Creatinine, Ser 1.33 (H) 0.61 - 1.24 mg/dL  ? Calcium 9.4 8.9 - 10.3 mg/dL  ? GFR, Estimated 52 (L) >60 mL/min  ? Anion gap 7 5 - 15  ?CBC     Status: Abnormal  ? Collection Time: 02/26/22 12:07 PM  ?Result Value Ref Range  ? WBC 3.8 (L) 4.0 - 10.5 K/uL  ? RBC 4.95 4.22 - 5.81 MIL/uL  ? Hemoglobin 14.0 13.0 - 17.0 g/dL  ? HCT 43.0 39.0 - 52.0 %  ? MCV 86.9 80.0 - 100.0 fL  ? MCH 28.3 26.0 - 34.0 pg  ? MCHC 32.6 30.0 - 36.0 g/dL  ? RDW 14.1 11.5 - 15.5 %  ? Platelets 44 (L) 150 - 400 K/uL  ? nRBC 0.0 0.0 - 0.2 %  ?Magnesium     Status: Abnormal  ? Collection Time: 02/26/22 12:07 PM  ?Result Value Ref Range  ? Magnesium 2.9 (H) 1.7 - 2.4 mg/dL  ?Lactic acid, plasma     Status: Abnormal  ? Collection Time: 02/26/22  2:01 PM  ?Result Value Ref Range  ? Lactic Acid, Venous 2.0 (HH) 0.5 - 1.9 mmol/L  ?CBC WITH DIFFERENTIAL     Status: Abnormal  ? Collection Time: 02/26/22  2:01 PM  ?Result Value Ref Range  ? WBC 3.2 (L) 4.0 - 10.5 K/uL  ? RBC 4.86 4.22 - 5.81 MIL/uL  ? Hemoglobin 13.9 13.0 - 17.0 g/dL  ? HCT 42.3 39.0 - 52.0 %  ? MCV 87.0 80.0 - 100.0 fL  ? MCH 28.6 26.0 - 34.0 pg  ? MCHC 32.9 30.0 - 36.0 g/dL  ? RDW 14.1 11.5 - 15.5 %  ? Platelets 42 (L) 150 - 400 K/uL  ? nRBC 0.0 0.0 - 0.2 %  ? Neutrophils Relative % 74 %  ? Neutro Abs 2.4 1.7 - 7.7 K/uL  ? Lymphocytes Relative 18 %  ? Lymphs Abs 0.6 (L) 0.7 - 4.0 K/uL  ? Monocytes Relative 8  %  ? Monocytes Absolute 0.3 0.1 - 1.0 K/uL  ? Eosinophils Relative 0 %  ? Eosinophils Absolute 0.0 0.0 - 0.5 K/uL  ? Basophils Relative 0 %  ? Basophils Absolute 0.0 0.0 - 0.1 K/uL  ? WBC Morphology MORPHOLOGY Glynda JaegerUNRE

## 2022-02-27 NOTE — Progress Notes (Signed)
?  Transition of Care (TOC) Screening Note ? ? ?Patient Details  ?Name: Kenneth Woods ?Date of Birth: [birth year] ? ? ?Transition of Care (TOC) CM/SW Contact:    ?Harriet Masson, RN ?Phone Number: ?02/27/2022, 7:42 AM ? ? ? ?Transition of Care Department [facility]) has reviewed patient and no TOC needs have been identified at this time. We will continue to monitor patient advancement through interdisciplinary progression rounds. If new patient transition needs arise, please place a TOC consult. ? ? ?

## 2022-02-27 NOTE — Assessment & Plan Note (Addendum)
Probably metabolic of some sort though not entirely clear at this point. ?1. Neg work up this far: ?1. Ammonia ?2. VBG ?3. UA ?4. CXR ?5. CT AP ?6. CT head ?2. Found on work up thus far: ?1. Mild leukopenia ?2. Mild AKI ?3. Mild thrombocytopenia - see thrombocytopenia below. ?3. Work up pending ?1. BCx ?2. MRI brain ?3. TSH ?4. COVID + Flu ?5. Testicular US ?4. May want to consider: ?1. VQ scan - though with thrombocytopenia, wont do empiric heparin ?2. LP ?3. Neuro consult depending on what MRI brain shows ?5. Got sepsis treatment in ED with cefepime, vanc, and IVF ?6. Will continue empiric sepsis treatment for the moment for possible sepsis as cause of AMS ?1. Especially with very recent history of refractory orchitis and epididimitis that was worsening as of 02/16/22 despite course(s) of outpt ABx ?

## 2022-02-27 NOTE — Assessment & Plan Note (Signed)
With thrombocytopenia and elevated D.Dimer ?1. Check [place] BLE to r/o DVT ?2. Empiric heparin considered for possible PE but not given due to severe thrombocytopenia ?

## 2022-02-27 NOTE — Progress Notes (Addendum)
[place] of testicles: prelim read per [place] tech is that it looks even a tad worse than it did 10 days ago.  Large pyrocele on R with compression of R testicle, now has some pyrocele on left as well. ? ?Official read per radiologist is that this is stable and unchanged from 10 days ago.  But this would be despite ABx course. ? ?At this point my working diagnosis for this patient is sepsis secondary to worsening orchitis, failed outpt ABx.  Sepsis being the cause of his AMS, mild leukopenia, mild thrombocytopenia (likely DIC), and AKI. ? ?Spoke with Dr. Jeffie Pollock who is on call for urology: ?Keep pt NPO ?He will see this pt this AM ?Will let RN know to delay the MRI brain since now etiology for encephalopathy is believed to be sepsis as discussed above ?Have canceled MRI brain order for now. ?Ordering type and screen ?

## 2022-02-27 NOTE — Progress Notes (Signed)
EEG complete - results pending 

## 2022-02-27 NOTE — Progress Notes (Signed)
Although testicles were not particularly impressive / abnormal on my initial physical exam (maybe some faint redness).  I am now informed by RN that over the course of the past couple of hours patient has developed significant testicular swelling / edema. ? ?Urology is already planning on seeing him this AM. ?

## 2022-02-27 NOTE — Progress Notes (Signed)
VASCULAR LAB ? ? ? ?Bilateral lower extremity venous duplex has been performed. ? ?See CV proc for preliminary results. ? ? ?Latasha Puskas, RVT ?02/27/2022, 2:34 PM ? ?

## 2022-02-27 NOTE — Progress Notes (Signed)
Patient transferred from [place] to 5W20. Patient is alert and oriented to person, place, time, and situation. Telemetry monitoring enabled, vital signs taken, and IVs assessed for patency. Skin checked with Roxanne Gates. Fall precautions initiated. Patient bed in the locked, lowest position. Non-slip socks in place and bed alarm on. Call bell is within reach. Patient knows to call for assistance prior to getting up and patient demonstrates use. Patient is laying comfortably in bed. ? ?

## 2022-02-27 NOTE — Consult Note (Addendum)
? ?[facility]  ?Telephone:(336) [phone] Fax:(336) [phone]  ? ? ?INITIAL HEMATOLOGY CONSULTATION ? ?Referring MD:  Dr. Lyda PeroneJared Woods ? ?Reason for Referral: Thrombocytopenia ? ?HPI: [name] is an 88 year old male with a past medical history significant for BPH, hypertension, COPD.  He has been having ongoing issues with orchitis and epididymitis for the past month.  He was recently seen in the ED on 5/4 and given Rocephin in the ED and admission was ordered but the patient declined.  He was given Bactrim as outpatient.  He came to the emergency department due to increased confusion at home for the past few days.  Full history was not able to be obtained due to altered mental status.  CBC on admission showed a WBC of 3.8, platelets 44,000, BUN 43, creatinine 1.33 (baseline 0.7-0.8), albumin 3.4, AST 47, T. bili was normal at 0.5.  DIC panel showed a fibrinogen of 131, D-dimer 11.04, platelets 39,000, no schistocytes.  Reticulocytes normal.  LDH mildly elevated at 220.  CT of the head showed no acute intracranial abnormality.  CT abdomen/pelvis showed no acute findings in the abdomen or pelvis, multiple stable chronic findings including marked prostate hypertrophy and bladder changes consistent with chronic bladder outlet obstruction.  Due to history of orchitis and epididymitis, an ultrasound of the scrotum was obtained which showed no evidence of testicular torsion, stable complex right hydrocele with mass effect on the right testis.  He was seen by urology earlier today and the right hydrocele was aspirated.  Fluid has been sent for culture.  There was no concern for abscess on exam.  Pelvic MRI could be considered if no other source of sepsis identified. ? ?His most recent CBC for comparison purposes was performed on 02/16/2022.  At that time, his platelet count was normal at 174,000. ? ?The patient's son was at the bedside this afternoon and history was obtained from him.  He states that his  father developed some mild confusion on Friday.  Due to ongoing symptoms, he was seen at urgent care in late June at over the weekend.  According to his son, his O2 sats were low and he was given an inhaler.  I do not have access to these records.  The patient had been receiving oral antibiotics.  According to his records, he was initially placed on Levaquin on 4/15 and it was prescribed again on 4/27.  He was seen in the ED on 5/4 and given a dose of Rocephin.  He did not want to be admitted and was discharged home on Bactrim DS 1 tablet twice a day.  The patient's son stated that he initially had some response with the antibiotics but then developed worsening pain in his right testicle.  He has not been having any fevers or chills at home.  In addition to the right testicle pain, he reported some pain in his shoulders.  To his son's knowledge, the patient has not required any PRBC or platelet transfusion in the past.  The patient's son tells me that he has taken an herbal product for prostate health.  However, he does not think that he is taking this recently.  The patient is widowed.  He has 4 children.  His son lives next door to him and checks on him regularly.  He previously smoked cigarettes but quit about 15 to 20 years ago.  He drinks about a 12 pack/week.  Hematology was asked see the patient for recommendations regarding his thrombocytopenia. ? ?Past  Medical History:  ?Diagnosis Date  ? Arthritis   ? Back pain, chronic   ? BPH (benign prostatic hypertrophy) with urinary obstruction   ? COPD (chronic obstructive pulmonary disease) (HCC)   ? Elevated PSA 10/16/2010  ? H/O prostate biopsy   ? Hemorrhoids, external   ? Hyperlipidemia   ? Hypertension   ?: ? ?Past Surgical History:  ?Procedure Laterality Date  ? FRACTURE SURGERY  1959  ? Work accident-caused FX whole body expect rt arm,was in body cast- was working on roof- hosp for 5 mos.  ? INGUINAL HERNIA REPAIR Bilateral 1983  ? PROSTATE BIOPSY N/A  06/20/2013  ? Procedure: PROSTATE BIOPSY;  Surgeon: Garnett Farm, MD;  Location[facility];  Service: Urology;  Laterality: N/A;  1 hour req for this case ? ?ULTRASOUND EQUIPHMENT TO BE PROVIDED BY [facility]  ?: ? ?CURRENT MEDS: ?Current Facility-Administered Medications  ?Medication Dose Route Frequency Provider Last Rate Last Admin  ? 0.9 %  sodium chloride infusion   Intravenous Continuous Hillary Bow, DO 100 mL/hr at 02/27/22 0819 New Bag at 02/27/22 0819  ? albuterol (VENTOLIN HFA) 108 (90 Base) MCG/ACT inhaler 2 puff  2 puff Inhalation Q6H PRN Loeffler, Grace C, PA-C      ? cefTRIAXone (ROCEPHIN) 2 g in sodium chloride 0.9 % 100 mL IVPB  2 g Intravenous Q24H Ghimire, Shanker M, MD      ? finasteride (PROSCAR) tablet 5 mg  5 mg Oral Daily Ghimire, Shanker M, MD      ? ondansetron [facility]) tablet 4 mg  4 mg Oral Q6H PRN Hillary Bow, DO      ? Or  ? ondansetron (ZOFRAN) injection 4 mg  4 mg Intravenous Q6H PRN Hillary Bow, DO      ? tamsulosin (FLOMAX) capsule 0.4 mg  0.4 mg Oral Daily Ghimire, Werner Lean, MD      ? ? ?Allergies  ?Allergen Reactions  ? Penicillins Anaphylaxis  ?: ? ?Family History  ?Problem Relation Age of Onset  ? CVA Mother   ? Cancer Father   ?     brain tumor  ?: ? ?Social History  ? ?Socioeconomic History  ? Marital status: Widowed  ?  Spouse name: Not on file  ? Number of children: Not on file  ? Years of education: Not on file  ? Highest education level: Not on file  ?Occupational History  ? Not on file  ?Tobacco Use  ? Smoking status: Former  ?  Packs/day: 1.00  ?  Years: 30.00  ?  Pack years: 30.00  ?  Types: Cigarettes  ?  Quit date: 06/09/2005  ?  Years since quitting: 16.7  ? Smokeless tobacco: Never  ?Vaping Use  ? Vaping Use: Never used  ?Substance and Sexual Activity  ? Alcohol use: Yes  ?  Comment: OCCASIONAL  ? Drug use: No  ? Sexual activity: Not Currently  ?Other Topics Concern  ? Not on file  ?Social History Narrative  ? Not on file   ? ?Social Determinants of Health  ? ?Financial Resource Strain: Not on file  ?Food Insecurity: Not on file  ?Transportation Needs: Not on file  ?Physical Activity: Not on file  ?Stress: Not on file  ?Social Connections: Not on file  ?Intimate Partner Violence: Not on file  ?: ? ?REVIEW OF SYSTEMS: History obtained from son.  Unable to obtain a review of systems from the patient due to encephalopathy. ? ?  Exam: ?Patient Vitals for the past 24 hrs: ? BP Temp Temp src Pulse Resp SpO2 Height Weight  ?02/27/22 0800 (!) 113/47 -- -- 63 20 91 % -- --  ?02/27/22 0400 (!) 114/47 -- -- 62 -- -- -- --  ?02/27/22 0324 (!) 109/49 99 ?F (37.2 ?C) Oral 64 (!) 21 91 % -- --  ?02/26/22 2332 (!) 99/50 98 ?F (36.7 ?C) Axillary 64 20 91 % -- --  ?02/26/22 2208 (!) 113/56 97.8 ?F (36.6 ?C) Oral 60 16 96 % 5\' 5"  (1.651 m) 62.2 kg  ?02/26/22 2030 (!) 114/55 -- -- (!) 54 19 93 % -- --  ?02/26/22 2000 (!) 112/52 -- -- (!) 53 16 92 % -- --  ?02/26/22 1900 99/82 -- -- (!) 55 16 95 % -- --  ?02/26/22 1800 (!) 111/55 -- -- -- 16 -- -- --  ?02/26/22 1730 (!) 130/51 -- -- -- 18 -- -- --  ?02/26/22 1630 (!) 129/51 -- -- (!) 54 14 95 % -- --  ?02/26/22 1545 122/71 -- -- (!) 54 15 95 % -- --  ?02/26/22 1520 -- -- -- -- -- -- 5\' 5"  (1.651 m) 68 kg  ?02/26/22 1511 -- 98.7 ?F (37.1 ?C) Rectal -- -- -- -- --  ?02/26/22 1430 (!) 116/49 -- -- (!) 50 16 97 % -- --  ?02/26/22 1330 (!) 110/52 -- -- (!) 55 17 93 % -- --  ?02/26/22 1319 (!) 108/51 -- -- (!) 56 19 93 % -- --  ?02/26/22 1237 -- -- -- (!) 53 19 95 % -- --  ?02/26/22 1235 (!) 120/51 -- -- -- -- -- -- --  ?02/26/22 1150 117/60 98.5 ?F (36.9 ?C) -- 62 18 90 % -- --  ? ? ?Physical Exam ?Vitals reviewed.  ?Constitutional:   ?   General: He is not in acute distress. ?   Appearance: He is ill-appearing.  ?HENT:  ?   Head: Normocephalic.  ?   Mouth/Throat:  ?   Mouth: Mucous membranes are dry.  ?   Pharynx: No oropharyngeal exudate.  ?Eyes:  ?   General: No scleral icterus. ?   Conjunctiva/sclera:  Conjunctivae normal.  ?Cardiovascular:  ?   Rate and Rhythm: Normal rate and regular rhythm.  ?Pulmonary:  ?   Effort: Pulmonary effort is normal. No respiratory distress.  ?Abdominal:  ?   Palpations: Abdomen is soft.  ?

## 2022-02-27 NOTE — Assessment & Plan Note (Signed)
Foley in place  

## 2022-02-27 NOTE — H&P (Signed)
?History and Physical  ? ? ?Patient: Kenneth Woods [id number] DOB: January 21, 1934 ?DOA: 02/26/2022 ?DOS: the patient was seen and examined on 02/27/2022 ?PCP: Susy Frizzle, MD  ?Patient coming from: Home ? ?Chief Complaint:  ?Chief Complaint  ?Patient presents with  ? Fatigue  ? ?HPI: Kenneth Woods is a 88 y.o. male with medical history significant of BPH, HTN, COPD. ? ?Pt has been battling with orchitis and epididymitis for past month month (prior to that looks like he had it toward end of 2022 as well). ? ?Most recently pt seen in ED on 5/4.  Epididymitis and orchitis was worsening as demonstrated on [place] then despite ABx treatment as outpt. ? ?Pt given rocephin in ED, offered admission but declined.  Given bactrim as outpt. ? ?Per family pt with increased confusion at home for past couple of days.  In to ED today due to AMS. ? ?Pt unable to contribute any to history on exam secondary to severe AMS.  ?Review of Systems: unable to review all systems due to the inability of the patient to answer questions. ?Past Medical History:  ?Diagnosis Date  ? Arthritis   ? Back pain, chronic   ? BPH (benign prostatic hypertrophy) with urinary obstruction   ? COPD (chronic obstructive pulmonary disease) ([place])   ? Elevated PSA 10/16/2010  ? H/O prostate biopsy   ? Hemorrhoids, external   ? Hyperlipidemia   ? Hypertension   ? ?Past Surgical History:  ?Procedure Laterality Date  ? FRACTURE SURGERY  1959  ? Work accident-caused FX whole body expect rt arm,was in body cast- was working on roof- hosp for 5 mos.  ? INGUINAL HERNIA REPAIR Bilateral 1983  ? PROSTATE BIOPSY N/A 06/20/2013  ? Procedure: PROSTATE BIOPSY;  Surgeon: Claybon Jabs, MD;  Location: [facility];  Service: Urology;  Laterality: N/A;  1 hour req for this case ? ?ULTRASOUND EQUIPHMENT TO BE PROVIDED BY [facility]  ? ?Social History:  reports that he quit smoking about 16 years ago. His smoking use included cigarettes. He has a 30.00  pack-year smoking history. He has never used smokeless tobacco. He reports current alcohol use. He reports that he does not use drugs. ? ?Allergies  ?Allergen Reactions  ? Penicillins Anaphylaxis  ? ? ?Family History  ?Problem Relation Age of Onset  ? CVA Mother   ? Cancer Father   ?     brain tumor  ? ? ?Prior to Admission medications   ?Medication Sig Start Date End Date Taking? Authorizing Provider  ?albuterol (PROVENTIL HFA;VENTOLIN HFA) 108 (90 Base) MCG/ACT inhaler Inhale 2 puffs into the lungs every 6 (six) hours as needed for wheezing or shortness of breath. 12/13/15   Dhungel, Flonnie Overman, MD  ?amLODipine (NORVASC) 5 MG tablet TAKE 1 TABLET BY MOUTH EVERY DAY 11/15/15   Dena Billet B, PA-C  ?ciprofloxacin (CIPRO) 250 MG tablet Take 1 tablet (250 mg total) by mouth every 12 (twelve) hours. 08/21/21   Dorie Rank, MD  ?finasteride (PROSCAR) 5 MG tablet TAKE 1 TABLET (5 MG TOTAL) BY MOUTH DAILY. 11/15/15   Orlena Sheldon, PA-C  ?guaiFENesin-dextromethorphan (ROBITUSSIN DM) 100-10 MG/5ML syrup Take 5 mLs by mouth every 4 (four) hours as needed for cough. 12/13/15   Dhungel, Flonnie Overman, MD  ?HYDROcodone-acetaminophen (NORCO/VICODIN) 5-325 MG tablet Take 1 tablet by mouth every 4 (four) hours as needed. 02/16/22   Isla Pence, MD  ?levofloxacin (LEVAQUIN) 500 MG tablet Take 1 tablet (500 mg total) by mouth  daily. 01/28/22   Lacretia Leigh, MD  ?lisinopril (PRINIVIL,ZESTRIL) 20 MG tablet TAKE 1 TABLET BY MOUTH EVERY DAY 09/29/15   Orlena Sheldon, PA-C  ?nitrofurantoin, macrocrystal-monohydrate, (MACROBID) 100 MG capsule Take 1 capsule (100 mg total) by mouth 2 (two) times daily. 01/08/21   Sherwood Gambler, MD  ?OVER THE COUNTER MEDICATION Take 2 capsules by mouth 2 (two) times daily. PROSTA-STRONG (saw palmetto,lycopene,pumpkin seed extract)    [provider]  ?tamsulosin (FLOMAX) 0.4 MG CAPS capsule TAKE ONE CAPSULE BY MOUTH EVERY DAY AFTER SUPPER 06/18/15   Dena Billet B, PA-C  ?tamsulosin (FLOMAX) 0.4 MG CAPS capsule  Take 1 capsule (0.4 mg total) by mouth daily. 08/28/20   Palumbo, April, MD  ?tamsulosin (FLOMAX) 0.4 MG CAPS capsule Take 1 capsule (0.4 mg total) by mouth daily. 12/22/20   Palumbo, April, MD  ? ? ?Physical Exam: ?Vitals:  ? 02/26/22 2000 02/26/22 2030 02/26/22 2208 02/26/22 2332  ?BP: (!) 112/52 (!) 114/55 (!) 113/56 (!) 99/50  ?Pulse: (!) 53 (!) 54 60 64  ?Resp: 16 19 16 20   ?Temp:   97.8 ?F (36.6 ?C) 98 ?F (36.7 ?C)  ?TempSrc:   Oral Axillary  ?SpO2: 92% 93% 96% 91%  ?Weight:   62.2 kg   ?Height:   5\' 5"  (1.651 m)   ? ?Constitutional: Lethargic, confused ?Eyes: PERRL, lids and conjunctivae normal ?ENMT: Mucous membranes are moist. Posterior pharynx clear of any exudate or lesions.Normal dentition.  ?Neck: normal, supple, no masses, no thyromegaly, cant really appreciate any meningismus. ?Respiratory: clear to auscultation bilaterally, no wheezing, no crackles. Normal respiratory effort. No accessory muscle use.  ?Cardiovascular: Regular rate and rhythm, no murmurs / rubs / gallops. No extremity edema. 2+ pedal pulses. No carotid bruits.  ?Abdomen: Epigastric, RUQ, and suprapubic TTP with guarding.  Doesn't seem to have any rebound.  These exam findings persist despite placement of foley catheter. ?Musculoskeletal: B calf TTP ?Skin: no rashes, lesions, ulcers. No induration ?Neurologic: MAE, confused, I dont appreciate anything focal on exam but exam very limited due to pt mental status. ?Psychiatric: Confused, oriented to self only ? ?Data Reviewed: ? ?  ? ?  Latest Ref Rng & Units 02/26/2022  ? 11:14 PM 02/26/2022  ?  3:03 PM 02/26/2022  ?  2:01 PM  ?CBC  ?WBC 4.0 - 10.5 K/uL   3.2    ?Hemoglobin 13.0 - 17.0 g/dL  14.3   13.9    ?Hematocrit 39.0 - 52.0 %  42.0   42.3    ?Platelets 150 - 400 K/uL 39    42    ? ?CMP  ?   ?Component Value Date/Time  ? NA 134 (L) 02/26/2022 1503  ? K 4.3 02/26/2022 1503  ? CL 98 02/26/2022 1207  ? CO2 30 02/26/2022 1207  ? GLUCOSE 141 (H) 02/26/2022 1207  ? BUN 43 (H) 02/26/2022 1207   ? CREATININE 1.33 (H) 02/26/2022 1207  ? CREATININE 0.82 11/12/2014 0920  ? CALCIUM 9.4 02/26/2022 1207  ? PROT 6.2 (L) 02/26/2022 1401  ? ALBUMIN 3.4 (L) 02/26/2022 1401  ? AST 47 (H) 02/26/2022 1401  ? ALT 33 02/26/2022 1401  ? ALKPHOS 64 02/26/2022 1401  ? BILITOT 0.5 02/26/2022 1401  ? GFRNONAA 52 (L) 02/26/2022 1207  ? GFRNONAA 85 11/12/2014 0920  ? GFRAA >60 06/02/2019 1844  ? GFRAA >89 11/12/2014 0920  ? ?Lactates of 1.8 and 2.0. ? ?Trops 18 and 18. ? ?CT head = nothing acute ? ?CT AP (non-contrast) -  chronic bladder outlet obstruction, large prostate, nothing acute though ? ?DIC panel = fibrinogen 131, D.Dimer 11.0, INR 1.2, APTT 34, no schistocytes on smear ? ?Assessment and Plan: ?* Acute encephalopathy ?Probably metabolic of some sort though not entirely clear at this point. ?Neg work up this far: ?Ammonia ?VBG ?UA ?CXR ?CT AP ?CT head ?Found on work up thus far: ?Mild leukopenia ?Mild AKI ?Mild thrombocytopenia - see thrombocytopenia below. ?Work up pending ?BCx ?MRI brain ?TSH ?COVID + Flu ?Testicular US ?May want to consider: ?VQ scan - though with thrombocytopenia, wont do empiric heparin ?LP ?Neuro consult depending on what MRI brain shows ?Got sepsis treatment in ED with cefepime, vanc, and IVF ?Will continue empiric sepsis treatment for the moment for possible sepsis as cause of AMS ?Especially with very recent history of refractory orchitis and epididimitis that was worsening as of 02/16/22 despite course(s) of outpt ABx ? ?Abdominal pain ?Pt with epigastric, RUQ TTP, and suprapubic TTP. ?Has guarding on exam. ?Has negative CT AP w/o contrast earlier this evening. ?Symptoms persist despite foley placement and >600cc UOP ?? Ischemic bowel ?Repeat lactic acid now ? ?Thrombocytopenia ([place]) ?Acute thrombocytopenia, wasn't present on labs 10 days ago. ?Consumption of platelets? ?TTP considered but: ?No microangiopathic hemolytic anemia, ?HGB 14 ?No schistiocytes on smear ?Likewise DIC also  considered, ?DOES have low fibrinogen ?Check [place] BLE to r/o DVT ?Curbsided Dr. Benay Spice: ?Hes actually suspicious of infection + DIC being the cause here, TTP felt less likely ?Save smear ?Check LDH ?Check retic count ? ?Or

## 2022-02-27 NOTE — Procedures (Signed)
Patient Name: Kenneth Woods  ?MRN: [id number]  ?Epilepsy Attending: Charlsie Quest  ?Referring Physician/Provider: Maretta Bees, MD ?Date: 02/27/2022 ?Duration: 22.05 mins ? ?Patient history: 88 year old male with altered mental status.  EEG to evaluate for seizure. ? ?Level of alertness: Awake ? ?AEDs during EEG study: None ? ?Technical aspects: This EEG study was done with scalp electrodes positioned according to the 10-20 International system of electrode placement. Electrical activity was acquired at a sampling rate of 500Hz  and reviewed with a high frequency filter of 70Hz  and a low frequency filter of 1Hz . EEG data were recorded continuously and digitally stored.  ? ?Description: No clear posterior dominant rhythm was seen.  EEG showed continuous generalized 3 to 5 Hz theta-delta slowing.  Hyperventilation and photic stimulation were not performed.    ? ?ABNORMALITY ?- Continuous slow, generalized ? ?IMPRESSION: ?This study is suggestive of moderate diffuse encephalopathy, nonspecific etiology. No seizures or epileptiform discharges were seen throughout the recording. ? ?  ? ?

## 2022-02-28 ENCOUNTER — Inpatient Hospital Stay (HOSPITAL_COMMUNITY): Payer: Medicare Other

## 2022-02-28 DIAGNOSIS — N452 Orchitis: Secondary | ICD-10-CM | POA: Diagnosis not present

## 2022-02-28 DIAGNOSIS — M79661 Pain in right lower leg: Secondary | ICD-10-CM | POA: Diagnosis not present

## 2022-02-28 DIAGNOSIS — G9340 Encephalopathy, unspecified: Secondary | ICD-10-CM | POA: Diagnosis not present

## 2022-02-28 DIAGNOSIS — D696 Thrombocytopenia, unspecified: Secondary | ICD-10-CM | POA: Diagnosis not present

## 2022-02-28 DIAGNOSIS — D72819 Decreased white blood cell count, unspecified: Secondary | ICD-10-CM | POA: Diagnosis not present

## 2022-02-28 DIAGNOSIS — N451 Epididymitis: Secondary | ICD-10-CM | POA: Diagnosis not present

## 2022-02-28 DIAGNOSIS — N179 Acute kidney failure, unspecified: Secondary | ICD-10-CM | POA: Diagnosis not present

## 2022-02-28 LAB — COMPREHENSIVE METABOLIC PANEL
ALT: 26 U/L (ref 0–44)
AST: 37 U/L (ref 15–41)
Albumin: 2 g/dL — ABNORMAL LOW (ref 3.5–5.0)
Alkaline Phosphatase: 47 U/L (ref 38–126)
Anion gap: 6 (ref 5–15)
BUN: 28 mg/dL — ABNORMAL HIGH (ref 8–23)
CO2: 21 mmol/L — ABNORMAL LOW (ref 22–32)
Calcium: 8.3 mg/dL — ABNORMAL LOW (ref 8.9–10.3)
Chloride: 111 mmol/L (ref 98–111)
Creatinine, Ser: 1.04 mg/dL (ref 0.61–1.24)
GFR, Estimated: >60 mL/min (ref >60)
Glucose, Bld: 90 mg/dL (ref 70–99)
Potassium: 3.9 mmol/L (ref 3.5–5.1)
Sodium: 138 mmol/L (ref 135–145)
Total Bilirubin: 0.3 mg/dL (ref 0.3–1.2)
Total Protein: 4.5 g/dL — ABNORMAL LOW (ref 6.5–8.1)

## 2022-02-28 LAB — CBC
HCT: 35.4 % — ABNORMAL LOW (ref 39.0–52.0)
Hemoglobin: 11.4 g/dL — ABNORMAL LOW (ref 13.0–17.0)
MCH: 28.3 pg (ref 26.0–34.0)
MCHC: 32.2 g/dL (ref 30.0–36.0)
MCV: 87.8 fL (ref 80.0–100.0)
Platelets: 52 10*3/uL — ABNORMAL LOW (ref 150–400)
RBC: 4.03 MIL/uL — ABNORMAL LOW (ref 4.22–5.81)
RDW: 14.5 % (ref 11.5–15.5)
WBC: 3.4 10*3/uL — ABNORMAL LOW (ref 4.0–10.5)
nRBC: 0 % (ref 0.0–0.2)

## 2022-02-28 LAB — URINE CULTURE: Culture: NO GROWTH

## 2022-02-28 IMAGING — MR MR PELVIS WO/W CM
4 of 11 series · 19 of 48 positions shown · IV contrast (gadavist)
Comparison: Scrotal ultrasound dated [DATE]. CT abdomen/pelvis
dated [DATE].

CLINICAL DATA: Orchitis and epididymitis. Evaluate possible lesion
at the base of the urethra on CT (per urology note).

EXAM:
MRI PELVIS WITHOUT AND WITH CONTRAST
TECHNIQUE: Multiplanar multisequence MR imaging of the pelvis was performed
both before and after administration of intravenous contrast.
CONTRAST:  6mL GADAVIST GADOBUTROL 1 MMOL/ML IV SOLN

[Series 3: T2 · coronal · 4.0mm · 0.70mm/px · 4 of 37 slices shown]
[im 1/37]
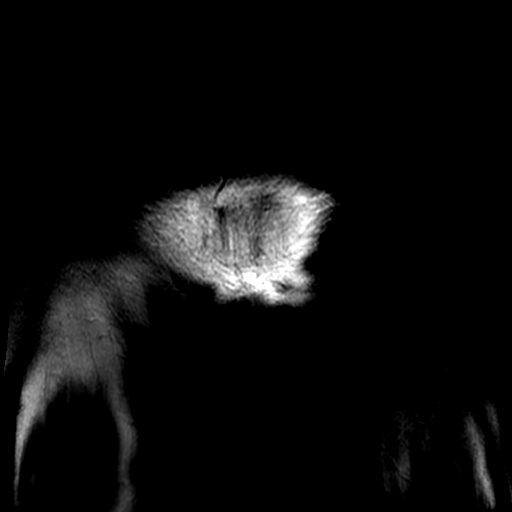
[im 13/37]
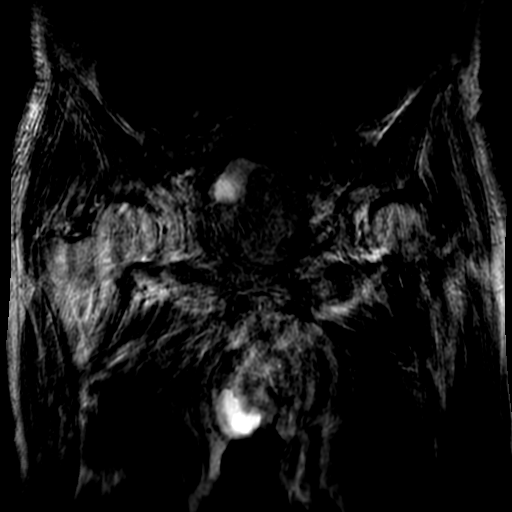
[im 25/37]
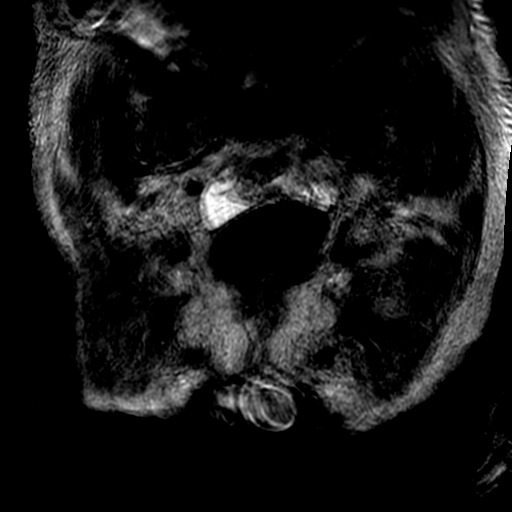
[im 37/37]
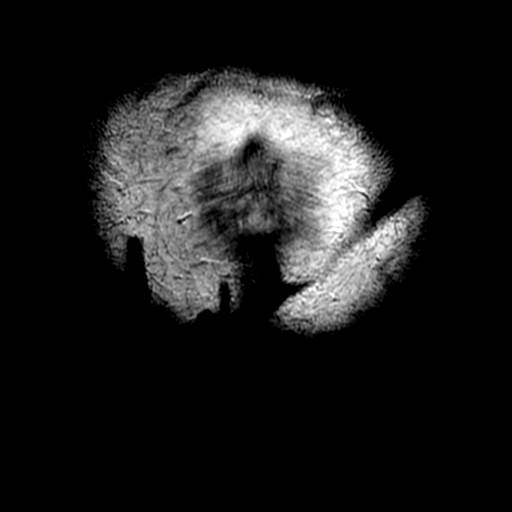

[Series 4: T2 fat-sat · axial · 4.0mm · 0.70mm/px · z∈[-153,+91]mm · 5 of 50 slices shown (1 of 2)]
[im 1/50]
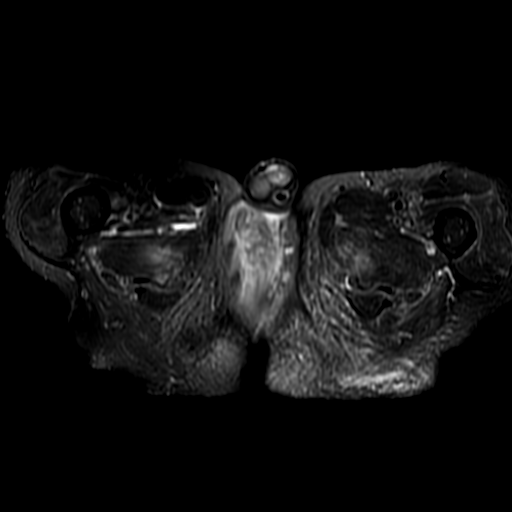
[im 13/50]
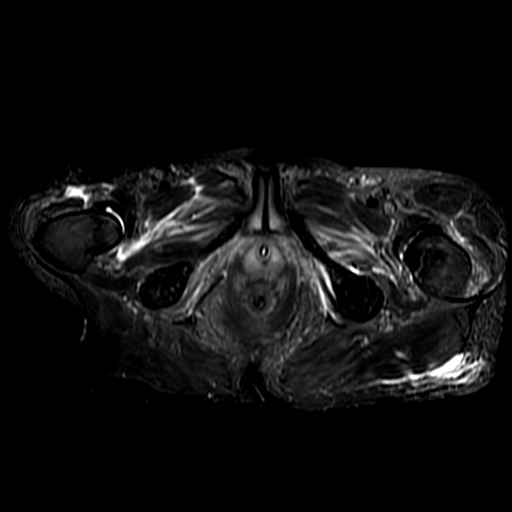
[im 25/50]
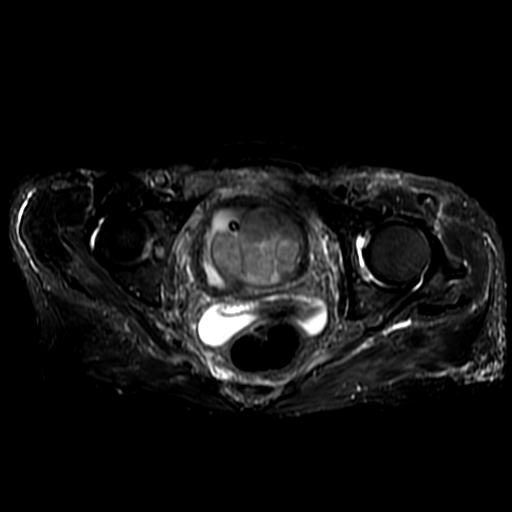
[im 37/50]
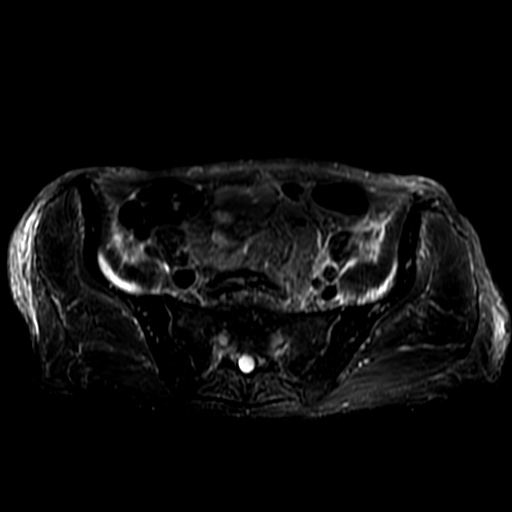
[im 50/50]
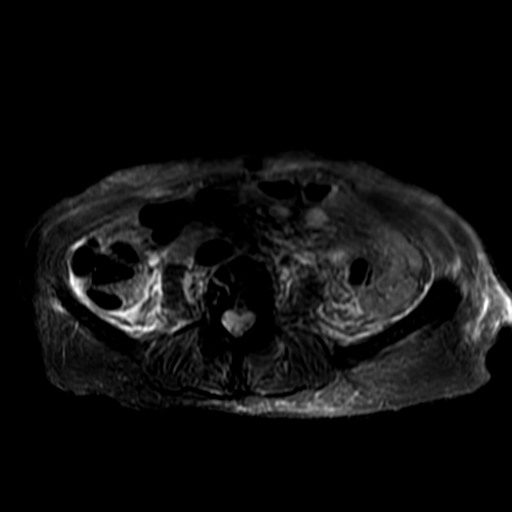

[Series 5: T2 fat-sat · sagittal · 4.0mm · 0.51mm/px · 6 of 68 slices shown (2 of 2)]
[im 1/68]
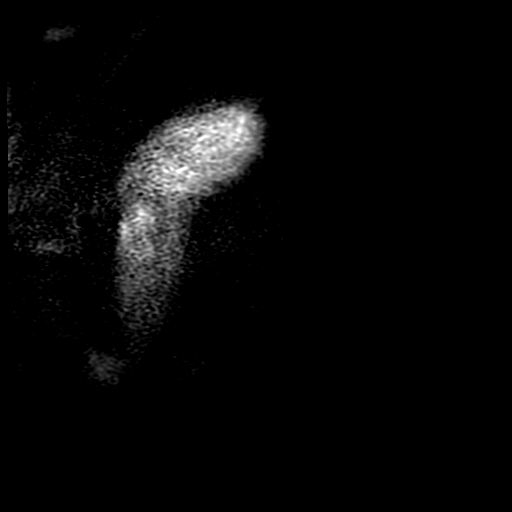
[im 14/68]
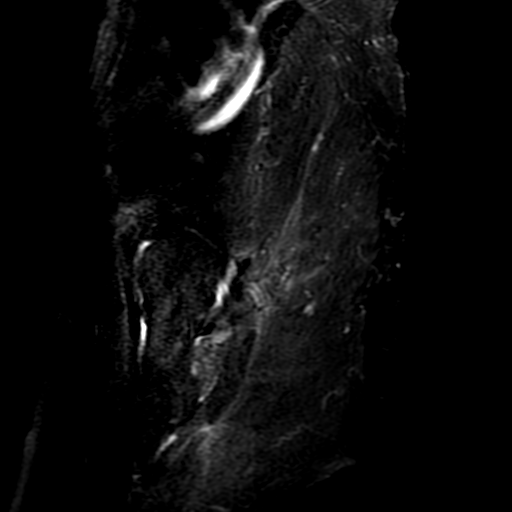
[im 27/68]
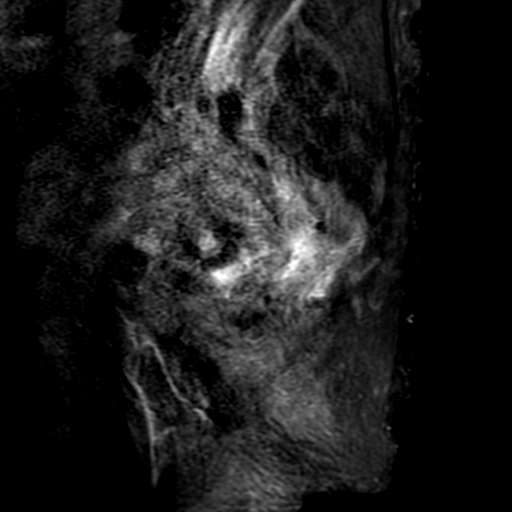
[im 41/68]
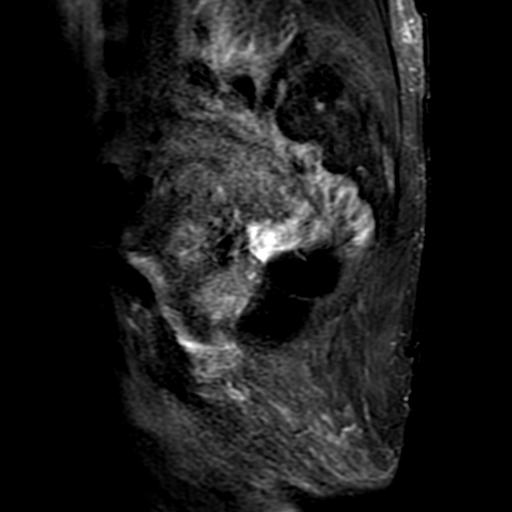
[im 54/68]
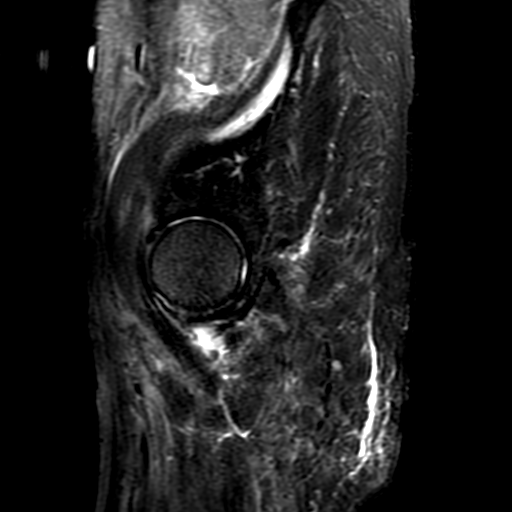
[im 68/68]
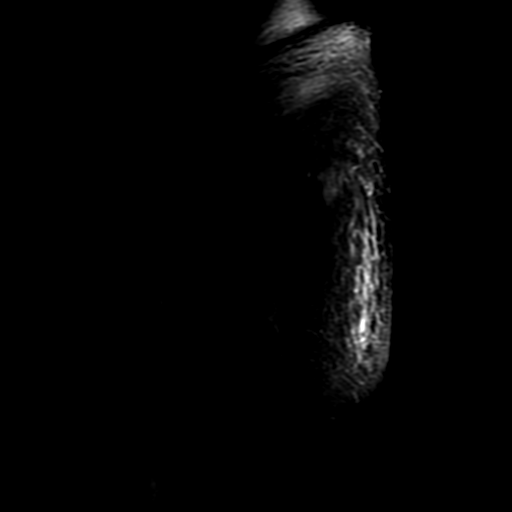

[Series 6: T1 fat-sat · axial · 4.0mm · 0.70mm/px · z∈[-153,+91]mm · 4 of 50 slices shown]
[im 1/50]
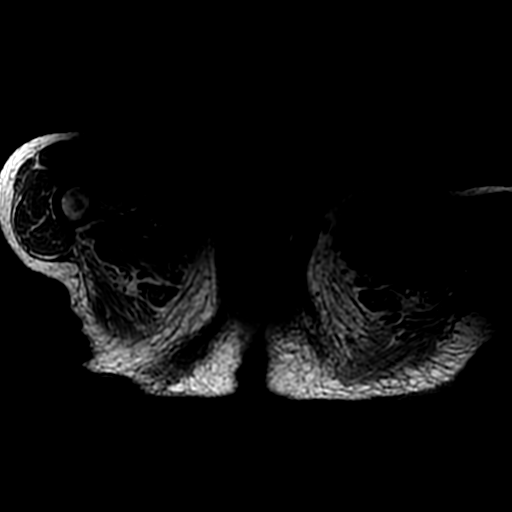
[im 17/50]
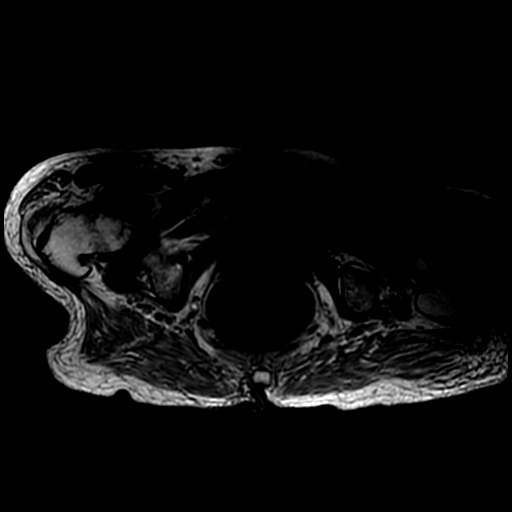
[im 33/50]
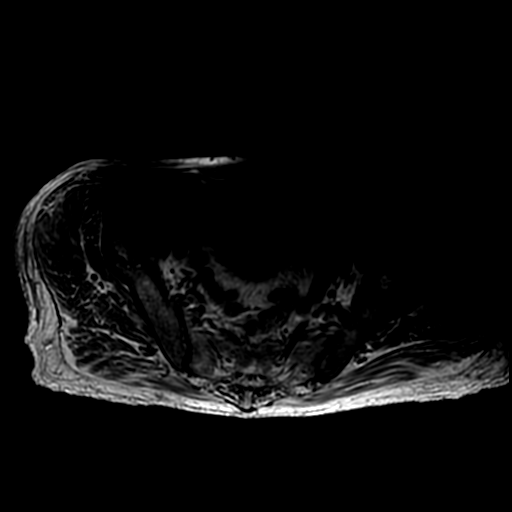
[im 50/50]
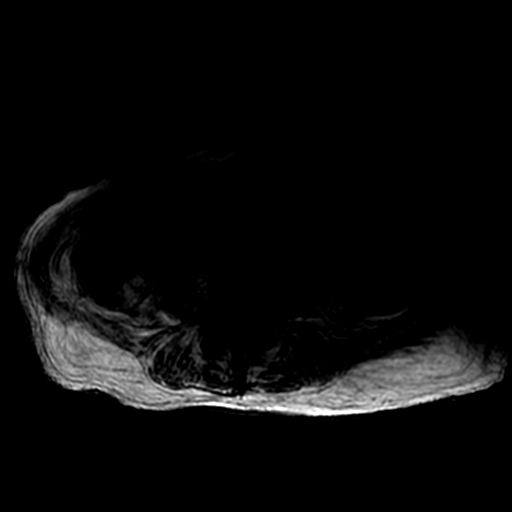

[19 of 48 positions shown; findings below may reference images not displayed]

FINDINGS: Markedly limited evaluation due to patient motion.

Urinary Tract: Thick-walled bladder, decompressed by an indwelling
Foley catheter.

Bowel:  Visualized bowel is grossly unremarkable.

Vascular/Lymphatic: No evidence of aneurysm.

No suspicious pelvic lymphadenopathy.

Reproductive: Prostatomegaly, with marked enlargement of the central
gland indenting the base of the bladder, reflecting BPH.

Right scrotal hydrocele (series 9/image 18), poorly visualized.

No abnormality of the penile urethra. Specifically, no findings
suspicious for periurethral abscess on MR.

Other:  Small volume pelvic ascites.

Musculoskeletal: Degenerative changes of the lumbar spine, with
grade 1 anterolisthesis of L5 on S1.
IMPRESSION: Markedly limited evaluation due to patient motion.

No findings suspicious for periurethral abscess on MR.

Right scrotal hydrocele, poorly visualized, better evaluated recent
scrotal ultrasound.

Marked BPH. Thick-walled bladder, decompressed by an indwelling
Foley catheter.

## 2022-02-28 IMAGING — MR MR HEAD W/O CM
4 of 8 series · 22 of 48 positions shown · non-contrast
Comparison: None Available.

CLINICAL DATA: Mental status change.

EXAM:
MRI HEAD WITHOUT CONTRAST
TECHNIQUE: Multiplanar, multiecho pulse sequences of the brain and surrounding
structures were obtained without intravenous contrast.

[Series 15: DWI · axial · 3.0mm · 0.94mm/px · z∈[+545,+703]mm · 8 of 108 slices shown (1 of 2)]
[im 1/108]
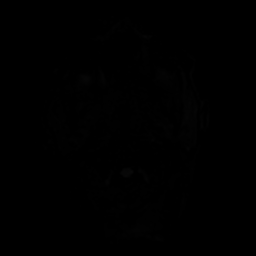
[im 12/108]
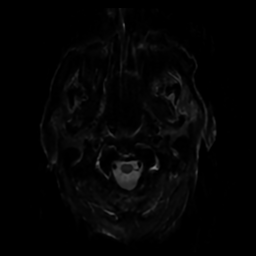
[im 36/108]
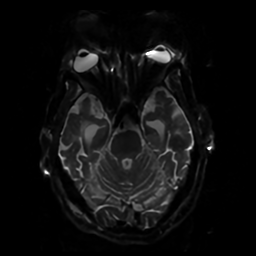
[im 48/108]
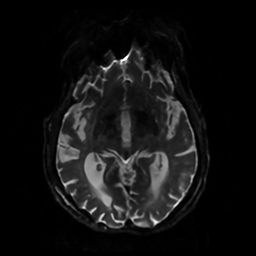
[im 60/108]
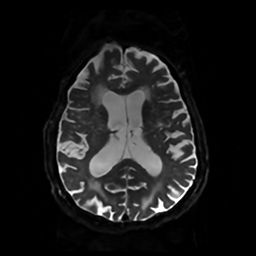
[im 72/108]
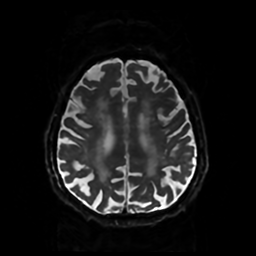
[im 96/108]
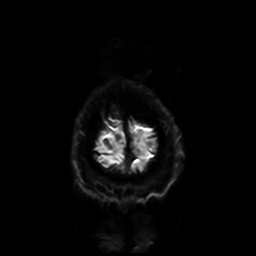
[im 108/108]
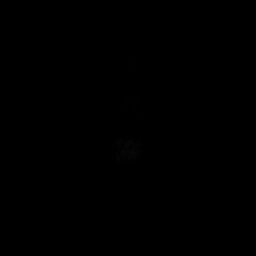

[Series 16: DWI · coronal · 4.0mm · 0.94mm/px · 7 of 73 slices shown (2 of 2)]
[im 1/73]
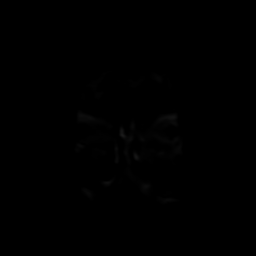
[im 11/73]
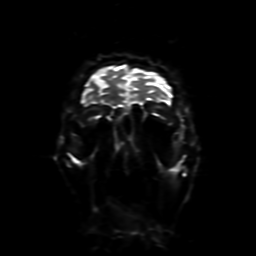
[im 21/73]
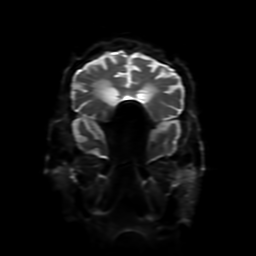
[im 31/73]
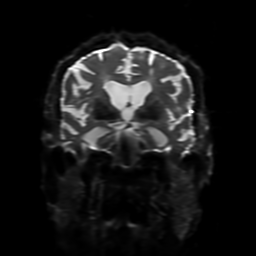
[im 42/73]
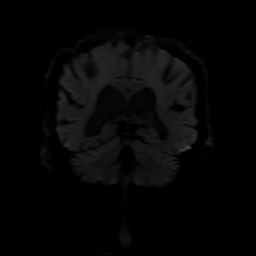
[im 52/73]
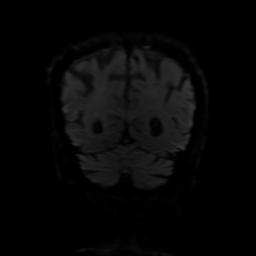
[im 62/73]
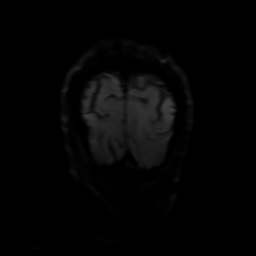

[Series 17: FLAIR · sagittal · 5.0mm · 0.23mm/px · 3 of 26 slices shown (1 of 2)]
[im 1/26]
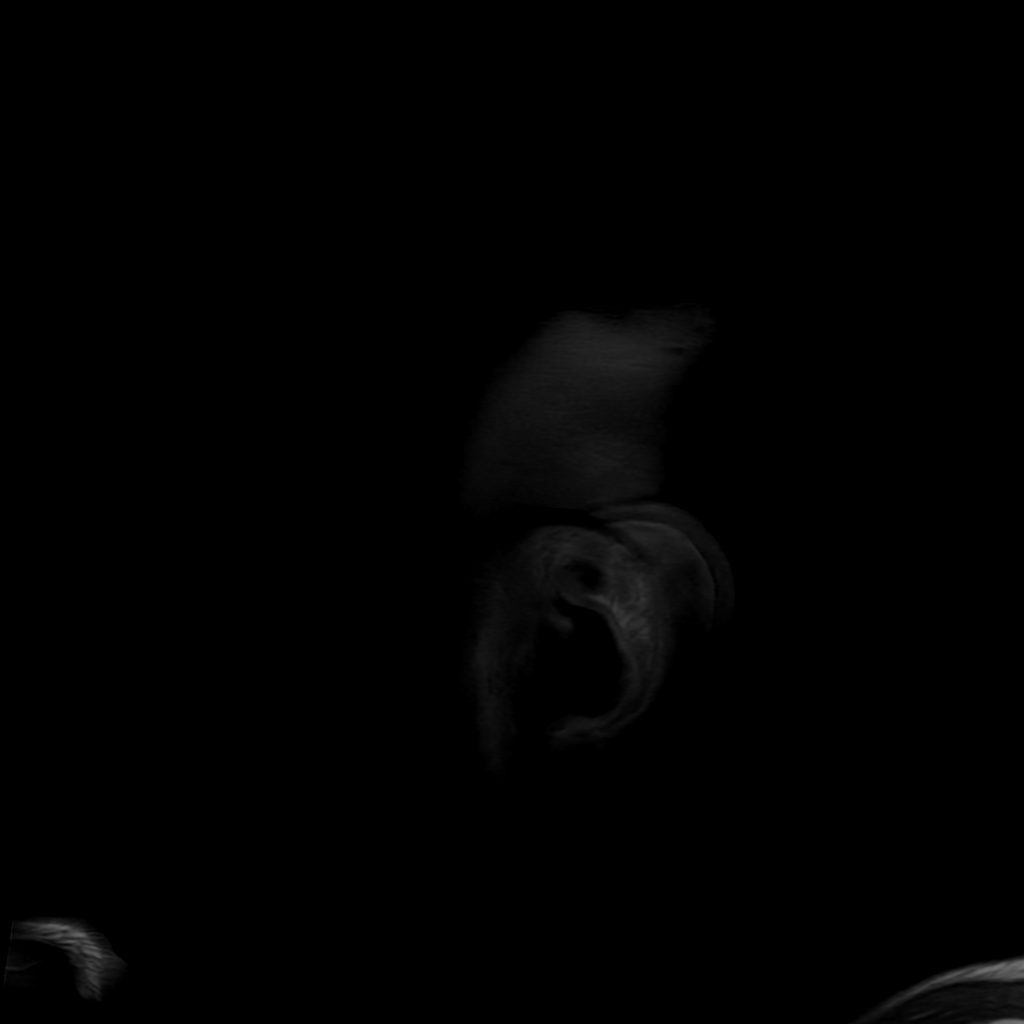
[im 13/26]
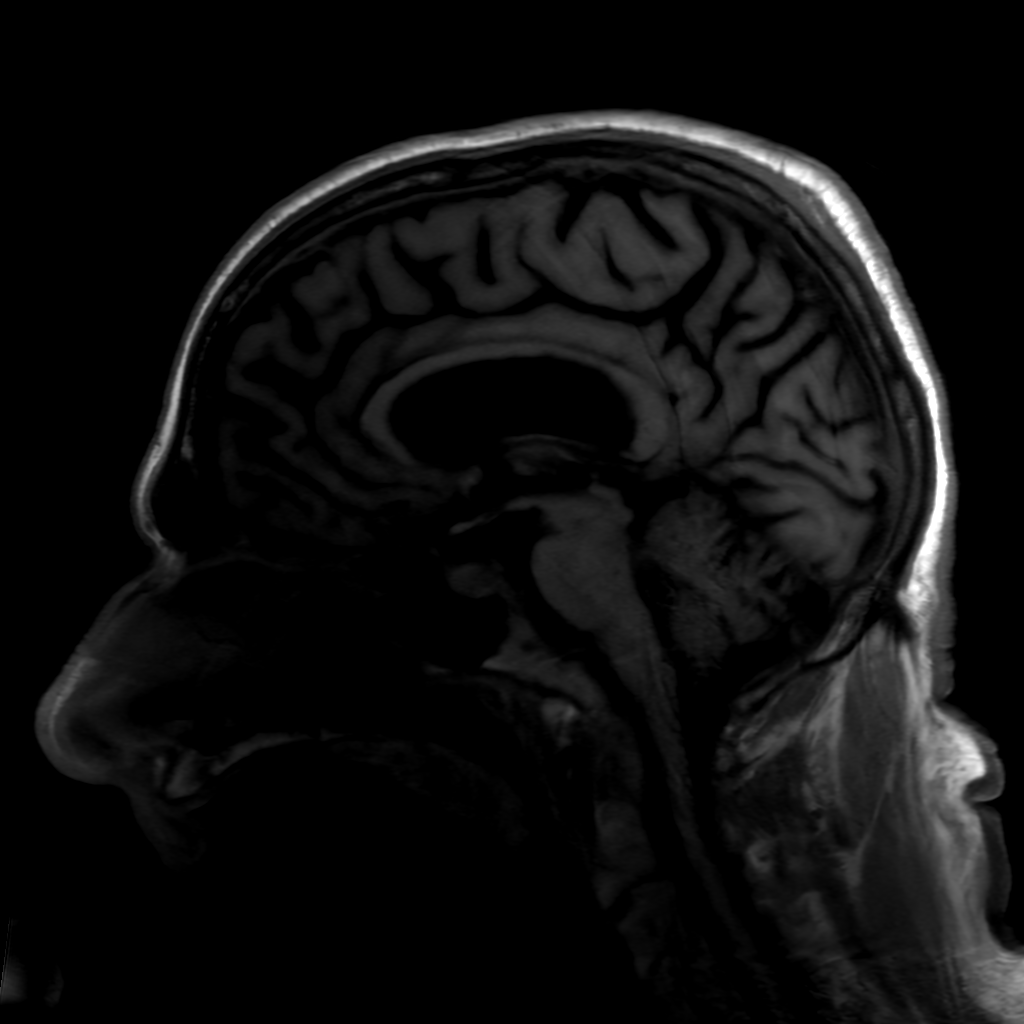
[im 26/26]
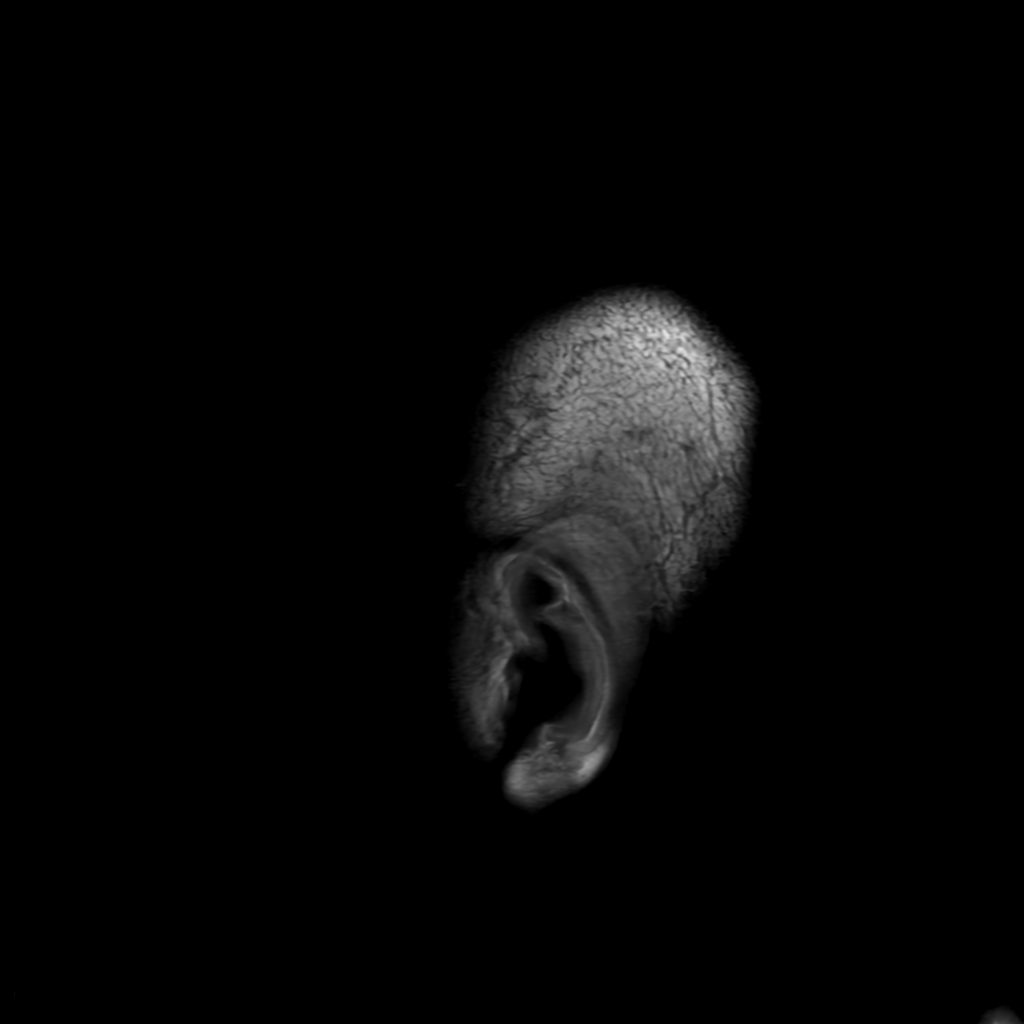

[Series 19: FLAIR · axial · 4.0mm · 0.45mm/px · z∈[+561,+709]mm · 4 of 35 slices shown (2 of 2)]
[im 1/35]
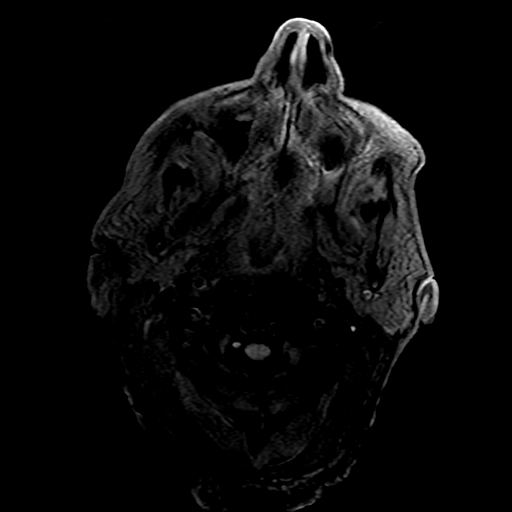
[im 12/35]
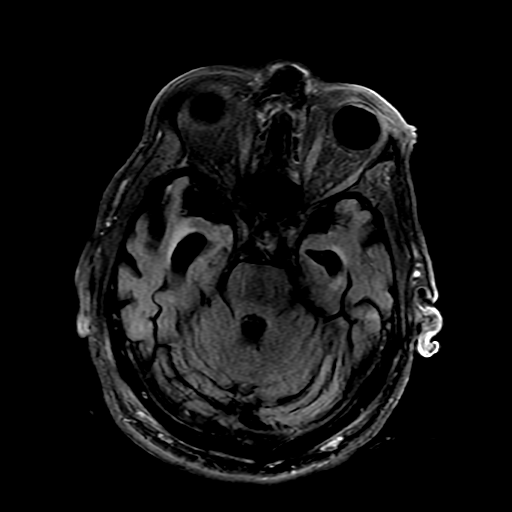
[im 23/35]
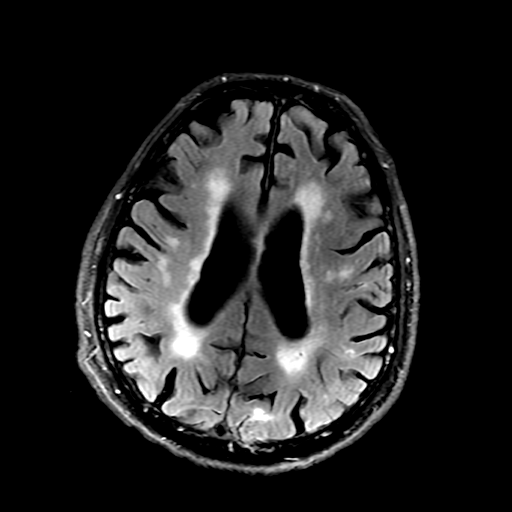
[im 35/35]
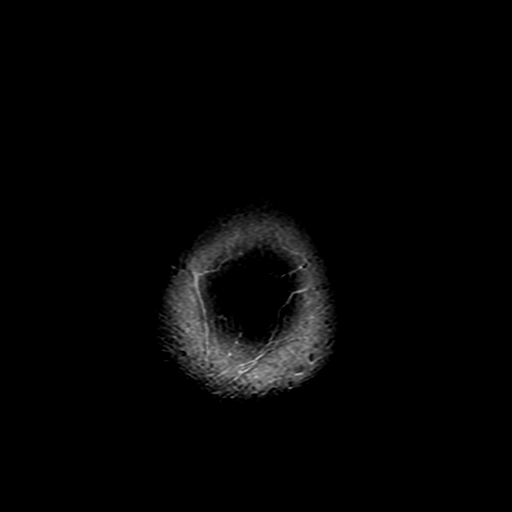

[22 of 48 positions shown; findings below may reference images not displayed]

FINDINGS: Brain: No acute infarct, mass effect or extra-axial collection.
Chronic Microhemorrhage in the left frontal operculum. There is
multifocal hyperintense T2-weighted signal within the white matter.
Generalized cerebral volume loss. The midline structures are normal.

Vascular: Major flow voids are preserved.

Skull and upper cervical spine: Normal calvarium and skull base.
Visualized upper cervical spine and soft tissues are normal.

Sinuses/Orbits:No paranasal sinus fluid levels or advanced mucosal
thickening. No mastoid or middle ear effusion. Normal orbits.
IMPRESSION: 1. No acute intracranial abnormality.
2. Generalized cerebral volume loss and chronic small vessel
disease.

## 2022-02-28 IMAGING — MR MR PELVIS WO/W CM
5 of 10 series · 19 of 48 positions shown · IV contrast (gadavist)
Comparison: Scrotal ultrasound dated [DATE]. CT abdomen/pelvis
dated [DATE].

CLINICAL DATA: Orchitis and epididymitis. Evaluate possible lesion
at the base of the urethra on CT (per urology note).

EXAM:
MRI PELVIS WITHOUT AND WITH CONTRAST
TECHNIQUE: Multiplanar multisequence MR imaging of the pelvis was performed
both before and after administration of intravenous contrast.
CONTRAST:  6mL GADAVIST GADOBUTROL 1 MMOL/ML IV SOLN

[Series 3: T2 · coronal · 4.0mm · 0.70mm/px · 3 of 37 slices shown]
[im 1/37]
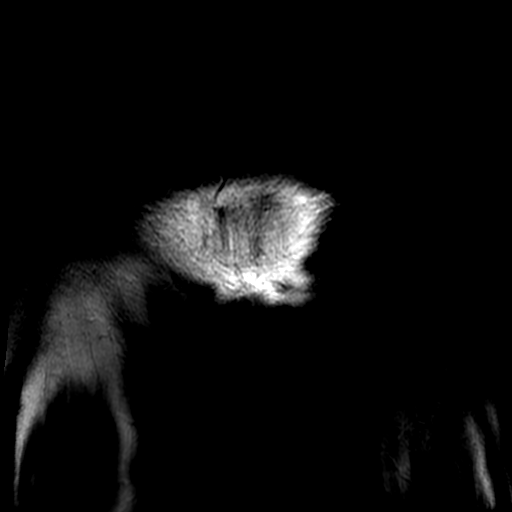
[im 19/37]
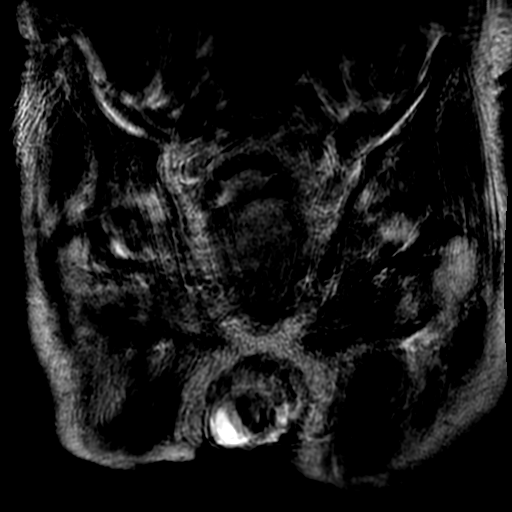
[im 37/37]
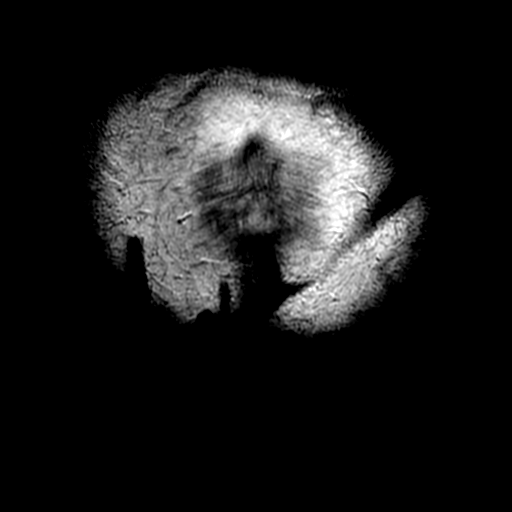

[Series 4: T2 fat-sat · axial · 4.0mm · 0.70mm/px · z∈[-153,+91]mm · 4 of 50 slices shown]
[im 1/50]
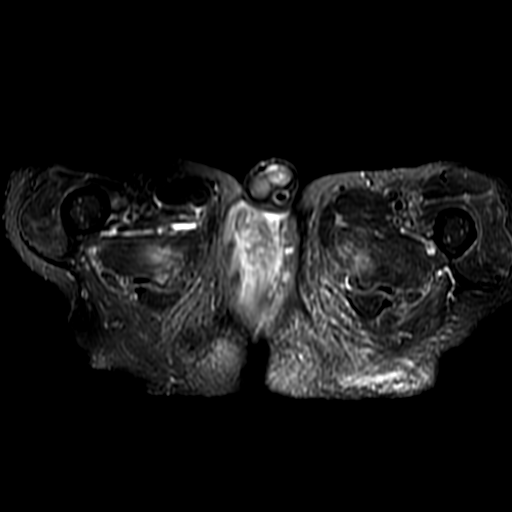
[im 17/50]
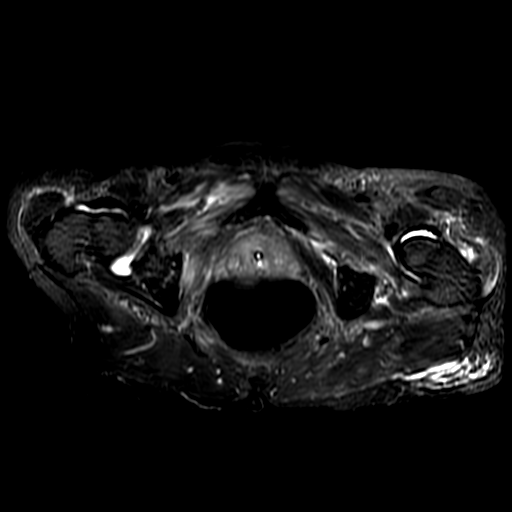
[im 33/50]
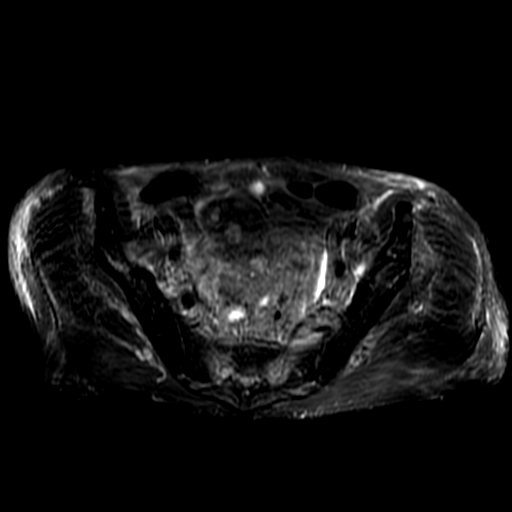
[im 50/50]
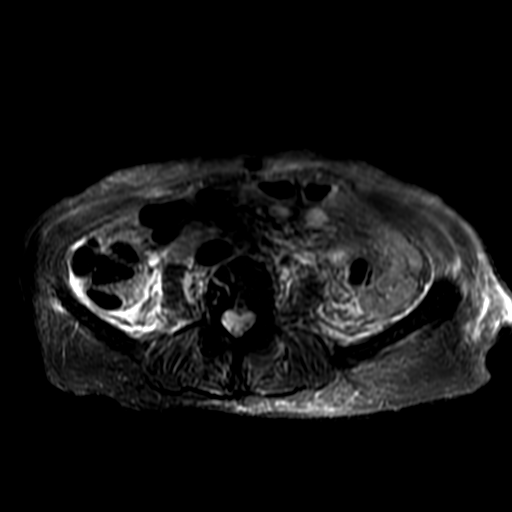

[Series 6: T1 fat-sat · axial · 4.0mm · 0.70mm/px · z∈[-153,+91]mm · 5 of 50 slices shown (1 of 2)]
[im 1/50]
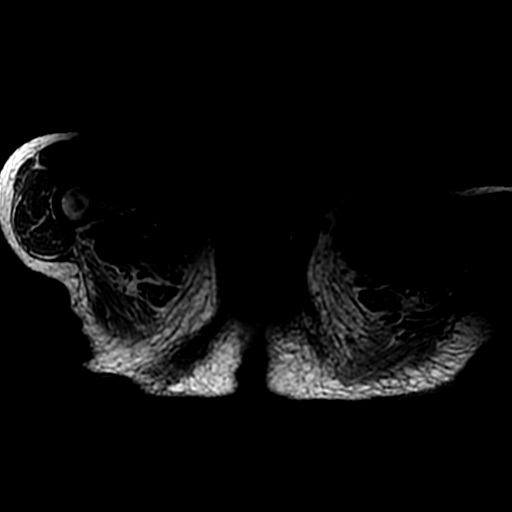
[im 13/50]
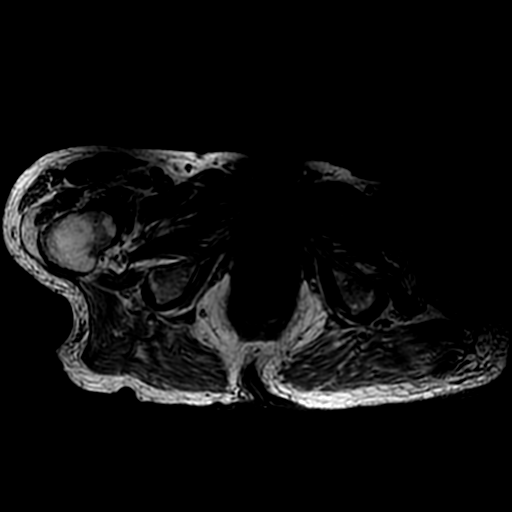
[im 25/50]
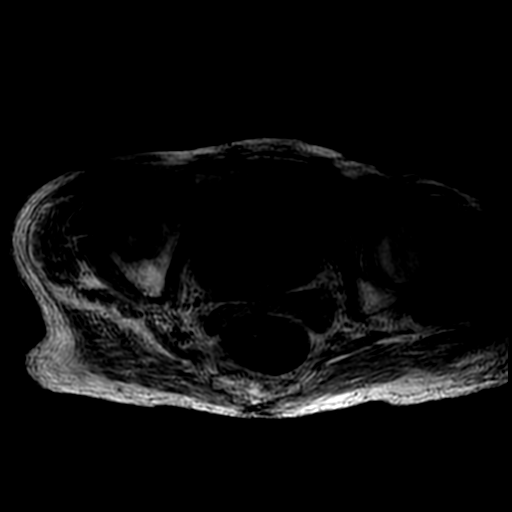
[im 37/50]
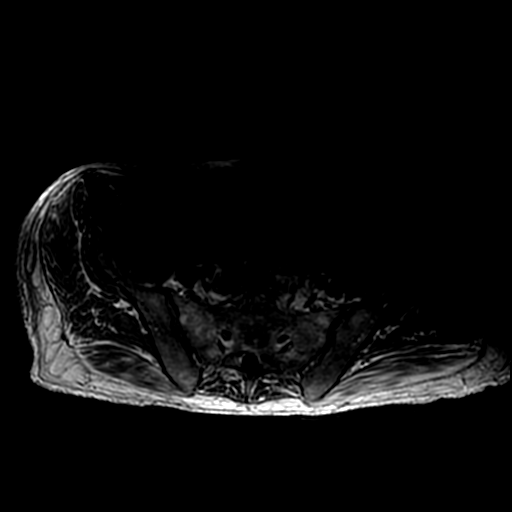
[im 50/50]
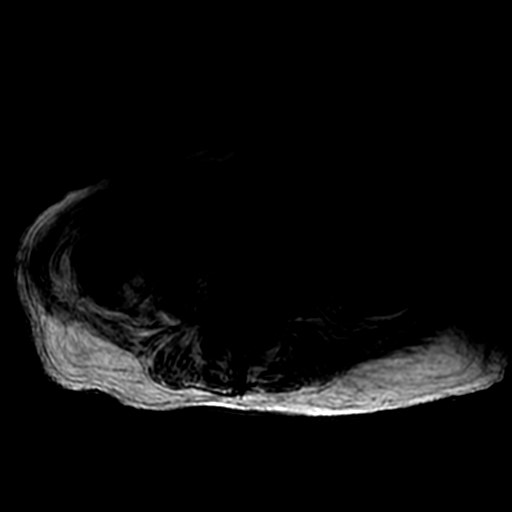

[Series 7: T1 fat-sat · axial · 4.0mm · 0.70mm/px · z∈[-153,+91]mm · 5 of 50 slices shown (2 of 2)]
[im 1/50]
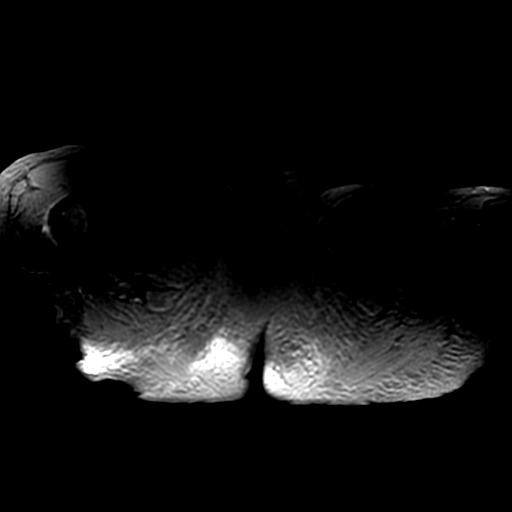
[im 13/50]
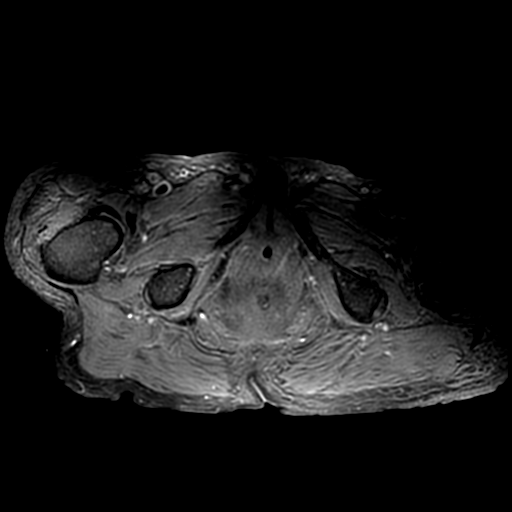
[im 25/50]
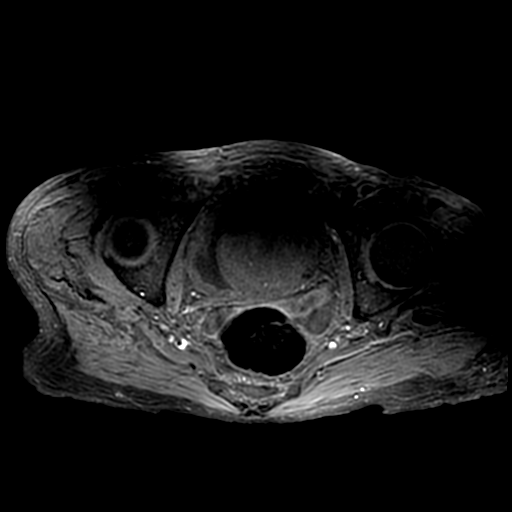
[im 37/50]
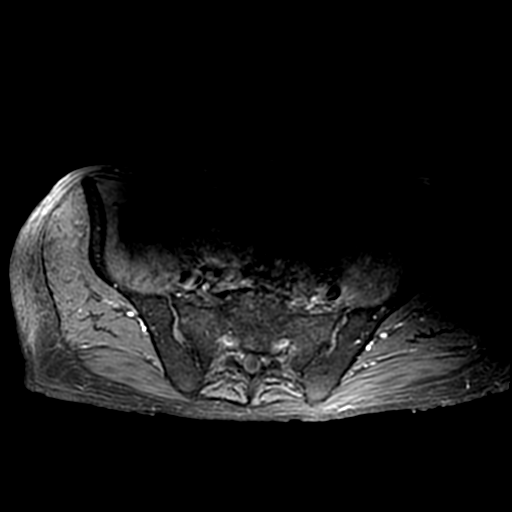
[im 50/50]
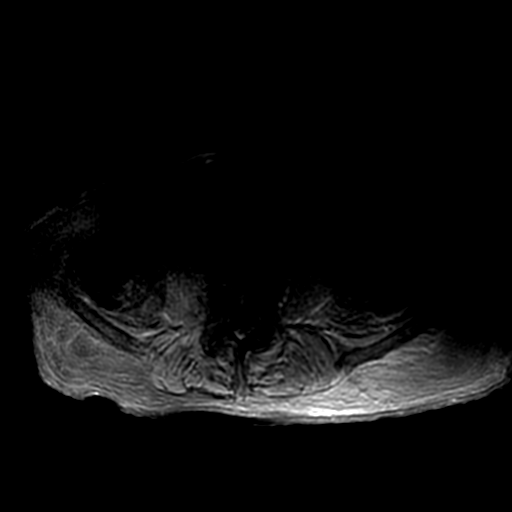

[Series 8: T1 · coronal · 4.0mm · 0.66mm/px · 2 of 37 slices shown]
[im 1/37]
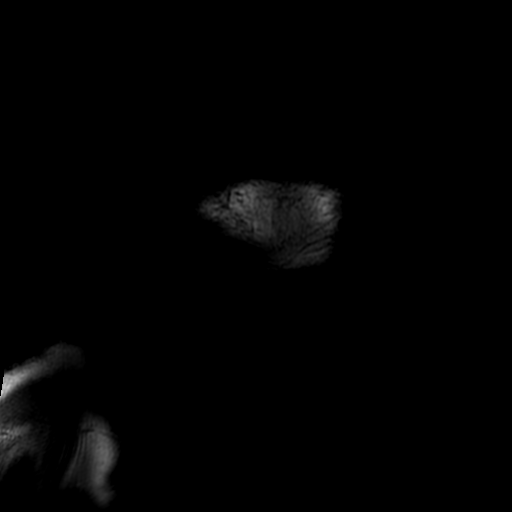
[im 13/37]
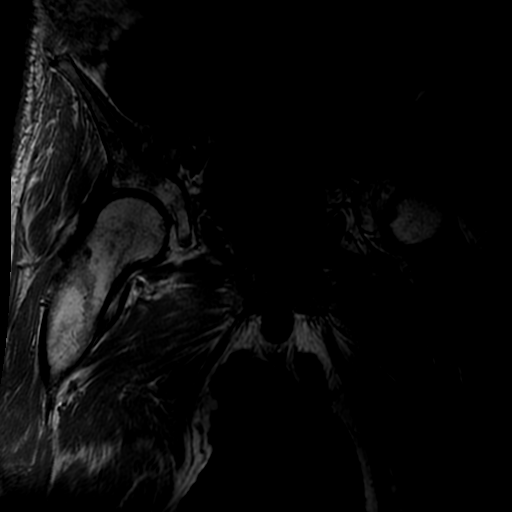

[19 of 48 positions shown; findings below may reference images not displayed]

FINDINGS: Markedly limited evaluation due to patient motion.

Urinary Tract: Thick-walled bladder, decompressed by an indwelling
Foley catheter.

Bowel:  Visualized bowel is grossly unremarkable.

Vascular/Lymphatic: No evidence of aneurysm.

No suspicious pelvic lymphadenopathy.

Reproductive: Prostatomegaly, with marked enlargement of the central
gland indenting the base of the bladder, reflecting BPH.

Right scrotal hydrocele (series 9/image 18), poorly visualized.

No abnormality of the penile urethra. Specifically, no findings
suspicious for periurethral abscess on MR.

Other:  Small volume pelvic ascites.

Musculoskeletal: Degenerative changes of the lumbar spine, with
grade 1 anterolisthesis of L5 on S1.
IMPRESSION: Markedly limited evaluation due to patient motion.

No findings suspicious for periurethral abscess on MR.

Right scrotal hydrocele, poorly visualized, better evaluated recent
scrotal ultrasound.

Marked BPH. Thick-walled bladder, decompressed by an indwelling
Foley catheter.

## 2022-02-28 MED ORDER — LORAZEPAM 2 MG/ML IJ SOLN: 0.5000 mg | Freq: Once | INTRAMUSCULAR | Status: DC | PRN

## 2022-02-28 MED ORDER — SODIUM CHLORIDE 0.9 % IV SOLN
100.0000 mg | Freq: Two times a day (BID) | INTRAVENOUS | Status: DC
  Administered 2022-02-28 – 2022-03-04 (×8): 100 mg via INTRAVENOUS
  Filled 2022-02-28 (×11): qty 100

## 2022-02-28 MED ORDER — LORAZEPAM 2 MG/ML IJ SOLN
1.0000 mg | Freq: Once | INTRAMUSCULAR | Status: AC | PRN
  Administered 2022-02-28: 1 mg via INTRAVENOUS
  Filled 2022-02-28: qty 1

## 2022-02-28 MED ORDER — DOXYCYCLINE HYCLATE 100 MG PO TABS: 100.0000 mg | ORAL_TABLET | Freq: Two times a day (BID) | ORAL | Status: DC

## 2022-02-28 MED ORDER — GADOBUTROL 1 MMOL/ML IV SOLN
6.0000 mL | Freq: Once | INTRAVENOUS | Status: AC | PRN
  Administered 2022-02-28: 6 mL via INTRAVENOUS

## 2022-02-28 MED ORDER — THIAMINE HCL 100 MG/ML IJ SOLN
500.0000 mg | INTRAVENOUS | Status: DC
  Administered 2022-02-28 – 2022-03-01 (×2): 500 mg via INTRAVENOUS
  Filled 2022-02-28 (×2): qty 5

## 2022-02-28 NOTE — Consult Note (Signed)
?   ? ? ? ? ?[facility]   ? ?Date of Admission:  02/26/2022    ? ?Reason for Consult: epididymitis recurrent    ?Referring Provider: Jerral Ralph ? ? ?Abx: ?5/16-c doxy ?5/15-c ceftriaxone ? ? ?5/14 vanc ?5/14 cefepime      ? ?Outpatient bactrim 5/04-16; levoflox 4/14-5/16 ? ? ?Assessment: ?88 yo male with bph/boo on tamsulosin/finasteride, recurrent epididymitis here for same complicated by ams and leukopenia/thrombocytopenia ? ?He has been followed by urology and has received 2 courses abx, initially with 3 weeks moxifloxacin and then bactrim. He had near resolution of sx each time and was able to function again but recurrent symptoms of severe testicular pain/swelling and this admission also metabolic encephalopathy ? ?He was seen by urology 5/15 who performed right sided hydrocele aspiration. There was bilateral scrotal swelling and mild tenderness on their exam. He has no erythema there. A foley was placed due to bph/urinary retention. At this time they do not detect any abscess ? ?He also has relative leukopenia and thrombocytopenia thought to be sepsis/dic related. I also consider bactrim side effect. onc evaluated ? ? ? ? ?As of today 5/16 he remains rather stable sick since admission on 5/14. I suspect his prostate enlargement a contributing factor to relapse. I also consider rare infection due to mycoplasma which could be fairly resistant. As susceptibility is difficult to achieve, doxycycline with potential repeat quinolone course wouldn't be unreasonable ? ?An mri is suggested to look for gu abscess ? ?I am concerned though he had received rather a long course with appropriate empiric abx (1 month levo). Urology is considering prostate embolization to help with obstruction, which is not unreasonable if current treatment is without help ? ? ?Plan: ?Follow up final culture of hydrocele fluid ?Continue ceftriaxone ?Will add doxycycline for 7 days ?Other management per urology ??mri  planned - agree with imaging ?Discussed with primary team ? ? ? ? ?------------------------------------------------ ?Principal Problem: ?  Acute encephalopathy ?Active Problems: ?  Retention of urine ?  Thrombocytopenia ([facility]) ?  Abdominal pain ?  Bilateral calf pain ?  Orchitis, epididymitis, and epididymo-orchitis ? ? ? ?HPI: Kenneth Woods is a 88 y.o. male bph, recent tx for uti/epididymitis here fore same ? ?Hx via chart, his son ?Patient is confused and not able to give history ? ?Patient has had 3 ed visits the last 2 month, on 4/04, 5/04, and 5/14 with admission ? ?He reported right groin pain 4/04 and was given a long course of levoflox. Ct at that time of his abdomen is unremarkable. He was told to f/u with urology ? ?His son said groin pain improves and he was functioning (getting around, taking care of the yard). He then got worse again and bactrim was given 5/04. It doesn't appear he was ever off any abx ? ?He had some improvement on bactrim but sx recurs and most recently 5/14 had mentation change. He was admitted on ed visit that day ? ? ?He is afebrile on admission ?I am unclear what other sx present outpatient as he is altered at this time ?Ct scan reviewed showed large prostate; scrotal doppler u/s showed bilateral hydrocele without imaging suggestion epididymitis ?Urology evaluated 5/15 and there was only mild tenderness right epididymis ?Hydrocele fluid cx is in process ?He was started on vanc/cefepime --> ceftriaxone ?Bcx ngtd ?Urine cx ngtd ? ?He has relative leukopenia/thrombocytopenia. Onc consulted and suspect infection related rather than ttp (mild aki at presentation resolved). Dic  panel obtained today showed low fibrinogen and dic was considered ? ?An mri pelvis is planned to look for abscess or other fluid collection ? ? ?Family History  ?Problem Relation Age of Onset  ? CVA Mother   ? Cancer Father   ?     brain tumor  ? ? ?Social History  ? ?Tobacco Use  ? Smoking status: Former  ?   Packs/day: 1.00  ?  Years: 30.00  ?  Pack years: 30.00  ?  Types: Cigarettes  ?  Quit date: 06/09/2005  ?  Years since quitting: 16.7  ? Smokeless tobacco: Never  ?Vaping Use  ? Vaping Use: Never used  ?Substance Use Topics  ? Alcohol use: Yes  ?  Comment: OCCASIONAL  ? Drug use: No  ? ? ?Allergies  ?Allergen Reactions  ? Penicillins Anaphylaxis  ? ? ?Review of Systems: ?ROS ?All Other ROS was negative, except mentioned above ? ? ?Past Medical History:  ?Diagnosis Date  ? Arthritis   ? Back pain, chronic   ? BPH (benign prostatic hypertrophy) with urinary obstruction   ? COPD (chronic obstructive pulmonary disease) ([facility])   ? Elevated PSA 10/16/2010  ? H/O prostate biopsy   ? Hemorrhoids, external   ? Hyperlipidemia   ? Hypertension   ? ? ? ? ? ?Scheduled Meds: ? Chlorhexidine Gluconate Cloth  6 each Topical Daily  ? finasteride  5 mg Oral Daily  ? tamsulosin  0.4 mg Oral Daily  ? ?Continuous Infusions: ? sodium chloride 100 mL/hr at 02/28/22 1429  ? cefTRIAXone (ROCEPHIN)  IV 2 g (02/28/22 1253)  ? doxycycline (VIBRAMYCIN) IV 100 mg (02/28/22 1434)  ? thiamine injection 500 mg (02/28/22 0912)  ? ?PRN Meds:.albuterol, LORazepam, ondansetron **OR** ondansetron (ZOFRAN) IV ? ? ?OBJECTIVE: ?Blood pressure (!) 128/59, pulse 61, temperature 98.4 ?F (36.9 ?C), temperature source Axillary, resp. rate 19, height 5\' 5"  (1.651 m), weight 62.2 kg, SpO2 95 %. ? ?Physical Exam ?General/constitutional: ill appearing, mumbles, not able to cooperate. Not conversant; eyes opening but not interactive ?HEENT: Normocephalic, PER, Conj Clear, EOMI, Oropharynx clear ?Neck supple ?CV: rrr no mrg ?Lungs: clear to auscultation, normal respiratory effort ?Abd: Soft, Nontender ?Ext: no edema ?Gu: swollen scrotal area bilaterally but no redness; appear to grimace in pain as I try to abduct legs so didn't palpate scrotum ?Skin: No Rash ?Neuro: somnolent, no rigidity/tremor, not able to cooperate for full neuro exam ?MSK: no peripheral joint  swelling/tenderness/warmth ? ? ? ? ?Lab Results ?Lab Results  ?Component Value Date  ? WBC 3.4 (L) 02/28/2022  ? HGB 11.4 (L) 02/28/2022  ? HCT 35.4 (L) 02/28/2022  ? MCV 87.8 02/28/2022  ? PLT 52 (L) 02/28/2022  ? PLT 54 (L) 02/28/2022  ?  ?Lab Results  ?Component Value Date  ? CREATININE 1.04 02/28/2022  ? BUN 28 (H) 02/28/2022  ? NA 138 02/28/2022  ? K 3.9 02/28/2022  ? CL 111 02/28/2022  ? CO2 21 (L) 02/28/2022  ?  ?Lab Results  ?Component Value Date  ? ALT 26 02/28/2022  ? AST 37 02/28/2022  ? ALKPHOS 47 02/28/2022  ? BILITOT 0.3 02/28/2022  ?  ? ? ?Microbiology: ?Recent Results (from the past 240 hour(s))  ?Urine Culture     Status: None  ? Collection Time: 02/26/22  2:01 PM  ? Specimen: In/Out Cath Urine  ?Result Value Ref Range Status  ? Specimen Description   Final  ?  IN/OUT CATH URINE ?Performed at [facility], [address], [place], [place] [address] ?  ? Special Requests   Final  ?  NONE ?Performed at [facility], [address], [place], [place] [address] ?  ? Culture   Final  ?  NO GROWTH ?Performed at [facility], [address] [place], [place] [address] ?  ? Report Status 02/28/2022 FINAL  Final  ?Culture, blood (Routine X 2) w Reflex to ID Panel     Status: None (Preliminary result)  ? Collection Time: 02/26/22  4:05 PM  ? Specimen: BLOOD LEFT FOREARM  ?Result Value Ref Range Status  ? Specimen Description   Final  ?  BLOOD LEFT FOREARM ?Performed at [facility], [address] [place], [place] [address] ?  ? Special Requests   Final  ?  BOTTLES DRAWN AEROBIC AND ANAEROBIC Blood Culture adequate volume ?Performed at [facility], [address] [place], [place] [address] ?  ? Culture   Final  ?  NO GROWTH 2 DAYS ?Performed at [facility], [address], [place], [place] [address] ?  ? Report Status PENDING  Incomplete  ?Culture, blood (Routine X 2) w Reflex to ID Panel     Status: None  (Preliminary result)  ? Collection Time: 02/26/22  4:20 PM  ? Specimen: BLOOD LEFT WRIST  ?Result Value Ref Range Status  ? Specimen Description   Final  ?  BLOOD LEFT WRIST ?Performed at [facility],

## 2022-02-28 NOTE — Progress Notes (Signed)
Oncology doctor in to see pt aware of pt with limited speech maybe a word if he is upset comes out clearly. Pt yells and moans if his legs or abdomen is lightly touched and SCDS removed. Abdomen tender to touch , md aware. Md aware of critical fibrinogen level 77.  ?

## 2022-02-28 NOTE — Progress Notes (Signed)
To MRI via bed for MRI brain and abdomen ?

## 2022-02-28 NOTE — Progress Notes (Signed)
?      ?                 PROGRESS NOTE ? ?      ?PATIENT DETAILS ?Name: Kenneth Woods ?Age: 88 y.o. ?Sex: male ?Date of Birth: 1934-05-04 ?Admit Date: 02/26/2022 ?Admitting Physician Ollen Bowl, MD ?MBE:[id number], Priscille Heidelberg, MD ? ?Brief Summary: ?Patient is a 88 y.o.  male with history of BPH, recent history of right epididymitis-has completed a course of Levaquin-most recently placed on Bactrim on 5/4 ED visit-presented to the hospital with altered mental status-subsequently admitted to the hospitalist service for further evaluation and treatment. ? ?Significant events: ?5/14>> admit to [facility] for AMS/leukopenia/thrombocytopenia.  Acute urinary retention-requiring Foley catheter insertion. ? ?Significant studies: ?5/14>> CT head: No acute findings. ?5/14>> CT abdomen/pelvis: No acute findings-marked prostate hypertrophy/chronic bladder outlet obstruction. ?5/14>> x-ray left knee: No fracture/dislocation ?5/14>> x-ray hip/pelvis: No fracture/dislocation ?5/14>> CXR: No pneumonia ?5/14>>NH4: normal ?5/15>>B12 level:normal ?5/15>>B1 level:pending ?5/15>> ultrasound scrotum: No torsion-stable complex right hydrocele with mass effect on the right testes. ?5/15>>EEG:no seizures ?5/15>> lower extremity Doppler: No DVT. ? ?Significant microbiology data: ?5/14>> blood culture: No growth ?5/14>> urine culture: No growth ?5/15>> COVID/influenza PCR: Negative ?5/15>> hydrocele culture: No growth ? ?Procedures: ?5/15>> bedside aspiration from right hydrocele by urology ? ?Consults: ?Urology, hematology, neurology, palliative care, infectious disease ? ?Subjective: ?A bit more awake compared to yesterday- will mumble something today.  Still confused.  Restless ? ?Objective: ?Vitals: ?Blood pressure 117/73, pulse 62, temperature 98.2 ?F (36.8 ?C), temperature source Axillary, resp. rate 18, height 5\' 5"  (1.651 m), weight 62.2 kg, SpO2 94 %.  ? ?Exam: ?Gen Exam: Awake but confused ?HEENT:atraumatic, normocephalic ?Chest: B/L clear  to auscultation anteriorly ?CVS:S1S2 regular ?Abdomen: Difficult exam-but appears soft-at times he will cry out when I touch his abdomen-but most of the other times-he does not have a similar response.  Do not think there is any significant tenderness.  Foley catheter remains in place. ?Extremities:no edema ?Neurology: Difficult exam but seems to be moving all 4 extremities. ?Skin: no rash  ? ?Pertinent Labs/Radiology: ? ?  Latest Ref Rng & Units 02/28/2022  ?  1:20 AM 02/27/2022  ?  4:08 AM 02/26/2022  ? 11:14 PM  ?CBC  ?WBC 4.0 - 10.5 K/uL 3.4   3.0     ?Hemoglobin 13.0 - 17.0 g/dL 02/28/2022   01.0     ?Hematocrit 39.0 - 52.0 % 35.4   34.1     ?Platelets 150 - 400 K/uL 52    ? 54   39   39    ?  ?Lab Results  ?Component Value Date  ? NA 138 02/28/2022  ? K 3.9 02/28/2022  ? CL 111 02/28/2022  ? CO2 21 (L) 02/28/2022  ? ?  ? ? ?Assessment/Plan: ?Acute metabolic encephalopathy: Unclear etiology-initially metabolic etiology suspected-probably from sepsis however-he does not have any features consistent with overt sepsis-encephalopathy seems somewhat disproportionate to what ever sepsis physiology he may have.  Awaiting MRI brain-EEG was negative.  Ammonia/vitamin B12 level stable.  Per son-patient used to drink alcohol heavily till last year (now only drinks socially)-empirically started on thiamine supplementation-B1 levels pending.  Since he continues to be encephalopathic-I have asked neurology to evaluate as well.   ? ?Possible DIC: Initially had mostly changes of anemia/leukopenia/thrombocytopenia that was thought to be from sepsis and possibly Bactrim related-however INR elevated today-hematology suspecting patient may be going into DIC.  Etiology unclear-some suspicion for sepsis due to urological  etiology-however nothing very overt at this point.  All cultures are negative so far.  Hematology following-we will await further recommendations. ? ?Recurrent right epididymitis-reactive hydrocele: Apparently has been on  numerous oral antibiotics in the outpatient setting (2 courses of Levaquin-and recently started on Bactrim on 5/4)-s/p aspiration of hydrocele on 5/15-cultures negative.  Initially on vancomycin/cefepime-has been narrowed down to Rocephin-given diagnostic uncertainty-severity of his clinical issues-possible early DIC-have asked infectious disease to chime in as well.   ? ?Possible fluid collection in the bulbar urethra: See urology note from 5/15-awaiting MRI pelvis to rule out a possible abscess.  ? ?AKI: Mild-resolved-probably hemodynamic related kidney injury and from acute urinary retention. ? ?Acute urinary retention: S/p Foley catheter inserted in the ED-given severe encephalopathy-continue Foley catheter.  Already on finasteride/Flomax for history of BPH. ? ?BPH: Continue finasteride/Flomax ? ?Bilateral calf pain: Dopplers negative. ? ?HTN: BP stable without the use of any antihypertensives-follow-up. ? ?3.4 cm abdominal aortic aneurysm: Defer further to the outpatient setting ? ?Palliative care: Full code for now-discussed with son-continue full scope of treatment.  Given the possibility of patient going into DIC-diagnostic uncertainty-severity of his clinical situation-have asked patient's son to continue to engage with other family members regarding goals of care-in case patient deteriorates further.  I have consulted palliative care as well. ? ?BMI: ?Estimated body mass index is 22.82 kg/m? as calculated from the following: ?  Height as of this encounter: 5\' 5"  (1.651 m). ?  Weight as of this encounter: 62.2 kg.  ? ?Code status: ?  Code Status: Full Code  ? ?DVT Prophylaxis: ?SCDs Start: 02/27/22 0102 ?  ?Family Communication: [name] [phone]-updated over the phone 5/15 ? ? ?Disposition Plan: ?Status is: Inpatient ?Remains inpatient appropriate because: Severe encephalopathy-not yet at baseline-we will require several days of hospitalization. ?  ?Planned Discharge Destination:Skilled  nursing facility vs SNF ? ? ?Diet: ?Diet Order   ? ?       ?  Diet NPO time specified Except for: Sips with Meds  Diet effective now       ?  ? ?  ?  ? ?  ?  ? ? ?Antimicrobial agents: ?Anti-infectives (From admission, onward)  ? ? Start     Dose/Rate Route Frequency Ordered Stop  ? 02/27/22 1700  vancomycin (VANCOREADY) IVPB 750 mg/150 mL  Status:  Discontinued       ? 750 mg ?150 mL/hr over 60 Minutes Intravenous Every 24 hours 02/26/22 1638 02/27/22 1018  ? 02/27/22 1115  cefTRIAXone (ROCEPHIN) 2 g in sodium chloride 0.9 % 100 mL IVPB       ? 2 g ?200 mL/hr over 30 Minutes Intravenous Every 24 hours 02/27/22 1021    ? 02/26/22 2200  ceFEPIme (MAXIPIME) 2 g in sodium chloride 0.9 % 100 mL IVPB  Status:  Discontinued       ? 2 g ?200 mL/hr over 30 Minutes Intravenous Every 12 hours 02/26/22 1618 02/27/22 1018  ? 02/26/22 1615  vancomycin (VANCOCIN) IVPB 1000 mg/200 mL premix       ? 1,000 mg ?200 mL/hr over 60 Minutes Intravenous  Once 02/26/22 1601 02/26/22 1811  ? ?  ? ? ? ?MEDICATIONS: ?Scheduled Meds: ? Chlorhexidine Gluconate Cloth  6 each Topical Daily  ? finasteride  5 mg Oral Daily  ? tamsulosin  0.4 mg Oral Daily  ? ?Continuous Infusions: ? sodium chloride 100 mL/hr at 02/28/22 0335  ? cefTRIAXone (ROCEPHIN)  IV 2 g (02/27/22 1147)  ? thiamine  injection 500 mg (02/28/22 0912)  ? ?PRN Meds:.albuterol, LORazepam, ondansetron **OR** ondansetron (ZOFRAN) IV ? ? ?I have personally reviewed following labs and imaging studies ? ?LABORATORY DATA: ?CBC: ?Recent Labs  ?Lab 02/26/22 ?1207 02/26/22 ?1401 02/26/22 ?1503 02/26/22 ?2314 02/27/22 ?0408 02/28/22 ?0120  ?WBC 3.8* 3.2*  --   --  3.0* 3.4*  ?NEUTROABS  --  2.4  --   --   --   --   ?HGB 14.0 13.9 14.3  --  11.6* 11.4*  ?HCT 43.0 42.3 42.0  --  34.1* 35.4*  ?MCV 86.9 87.0  --   --  86.8 87.8  ?PLT 44* 42*  --  39* 39* 54*  52*  ? ? ? ?Basic Metabolic Panel: ?Recent Labs  ?Lab 02/26/22 ?1207 02/26/22 ?1503 02/27/22 ?0123 02/28/22 ?0120  ?NA 135 134* 134* 138   ?K 4.4 4.3 4.4 3.9  ?CL 98  --  107 111  ?CO2 30  --  20* 21*  ?GLUCOSE 141*  --  97 90  ?BUN 43*  --  35* 28*  ?CREATININE 1.33*  --  0.97 1.04  ?CALCIUM 9.4  --  8.5* 8.3*  ?MG 2.9*  --   --   --   ? ? ? ?GF

## 2022-02-28 NOTE — Progress Notes (Addendum)
HEMATOLOGY-ONCOLOGY PROGRESS NOTE ? ?ASSESSMENT AND PLAN: ?1.  Thrombocytopenia ?2.  Mild leukopenia ?3.  Acute encephalopathy ?4.  Recurrent right epididymitis/hydrocele ?5.  Possible fluid collection at the bulbar urethra ?6.  AKI ?7.  Acute urinary retention ?8.  BPH ?9.  Hypertension ?10.  3.4 cm abdominal aortic aneurysm ?11.  COPD ?12.  DIC ? ?Mr. Dulac remains encephalopathic.  The etiology remains unclear.  MRI of the brain has been ordered but not yet completed.  Primary team has consulted neurology. ? ?His platelet count has improved this morning.  White blood cell count mildly low but improving and hemoglobin is stable.  DIC panel obtained this morning shows a fibrinogen level of 77.  His INR is more elevated today.  Overall picture concerning for DIC.  Recommend administration of cryoprecipitate if the fibrinogen is less than 50 or for bleeding. Repeat DIC panel tomorrow.  The cause for the DIC is unclear.?  Infection in the urinary tract but cultures have been negative to date.  He is on empiric antibiotics and primary team has asked ID to consult. ? ?He appears to yell out in pain when his abdomen and legs are touched.  CT of the abdomen did not show any acute findings.  Doppler ultrasounds of the bilateral lower extremities did not show any evidence of DVT.  MRI of the pelvis has been ordered but not yet completed. ? ?Would recommend engaging the palliative care team to assist with goals of care discussion. ? ?Recommendations: ?1.  Monitor daily CBC and DIC panel ?2.  Cryoprecipitate if fibrinogen level less than 50 or for bleeding. ?3.  Proceed with MRI of the pelvis. ?4.  Recommend palliative care consult to assist with goals of care discussion. ?5.  Neurology consulted by primary team to help evaluate encephalopathy. ?6.  ID has been consulted per primary team to evaluate for source of infection. ? ?[name], DNP, AGPCNP-BC, AOCNP ?Mr. Mcaden was examined.  He remained confused and  uncooperative with exam when I saw him earlier this morning.  He became agitated when I touched his abdomen or left leg.  He has no apparent bleeding. ?The etiology of the confusion and pain remains unclear.  The clinical presentation is most consistent with a systemic infection, but none has been identified. ?He has a coagulopathy with a low fibrinogen level.  No bleeding.  The platelet count is higher today.  We will monitor the fibrinogen level and provide cryoprecipitate if the fibrinogen continues to decline. ? ?I was present for greater than 50% of today's visit.  I performed medical decision making. ? ?SUBJECTIVE: ?Remains confused.  He also had pain when touched particularly on his abdomen or lower extremities.  No bleeding. ? ?PHYSICAL EXAMINATION: ? ?Vitals:  ? 02/28/22 0500 02/28/22 0600  ?BP:    ?Pulse: (!) 59   ?Resp: 17 17  ?Temp:    ?SpO2: 94%   ? ?Filed Weights  ? 02/26/22 1520 02/26/22 2208  ?Weight: 68 kg 62.2 kg  ? ? ?Intake/Output from previous day: ?05/15 0701 - 05/16 0700 ?In: -  ?Out: 475 [Urine:475] ? ?Physical Exam ?Vitals reviewed.  ?Constitutional:   ?   Appearance: He is ill-appearing.  ?HENT:  ?   Head: Normocephalic.  ?   Mouth/Throat:  ?   Mouth: Mucous membranes are dry.  ?Eyes:  ?   General: No scleral icterus. ?Pulmonary:  ?   Effort: Pulmonary effort is normal. No respiratory distress.  ?Abdominal:  ?   Palpations:  Abdomen is soft.  ?   Comments: Tenderness with light palpation over the entire abdomen  ?Skin: ?   General: Skin is warm and dry.  ? ? ?LABORATORY DATA:  ?I have reviewed the data as listed ? ?  Latest Ref Rng & Units 02/28/2022  ?  1:20 AM 02/27/2022  ?  1:23 AM 02/26/2022  ?  3:03 PM  ?CMP  ?Glucose 70 - 99 mg/dL 90   97     ?BUN 8 - 23 mg/dL 28   35     ?Creatinine 0.61 - 1.24 mg/dL 1.04   0.97     ?Sodium 135 - 145 mmol/L 138   134   134    ?Potassium 3.5 - 5.1 mmol/L 3.9   4.4   4.3    ?Chloride 98 - 111 mmol/L 111   107     ?CO2 22 - 32 mmol/L 21   20     ?Calcium  8.9 - 10.3 mg/dL 8.3   8.5     ?Total Protein 6.5 - 8.1 g/dL 4.5   5.2     ?Total Bilirubin 0.3 - 1.2 mg/dL 0.3   0.5     ?Alkaline Phos 38 - 126 U/L 47   57     ?AST 15 - 41 U/L 37   47     ?ALT 0 - 44 U/L 26   32     ? ? ?Lab Results  ?Component Value Date  ? WBC 3.4 (L) 02/28/2022  ? HGB 11.4 (L) 02/28/2022  ? HCT 35.4 (L) 02/28/2022  ? MCV 87.8 02/28/2022  ? PLT 52 (L) 02/28/2022  ? PLT 54 (L) 02/28/2022  ? NEUTROABS 2.4 02/26/2022  ? ? ?No results found for: CEA1, CEA, [id number], CA125, PSA1 ? ?CT Abdomen Pelvis Wo Contrast ? ?Result Date: 02/26/2022 ?CLINICAL DATA:  Generalized abdominal pain. EXAM: CT ABDOMEN AND PELVIS WITHOUT CONTRAST TECHNIQUE: Multidetector CT imaging of the abdomen and pelvis was performed following the standard protocol without IV contrast. RADIATION DOSE REDUCTION: This exam was performed according to the departmental dose-optimization program which includes automated exposure control, adjustment of the mA and/or kV according to patient size and/or use of iterative reconstruction technique. COMPARISON:  01/27/2022. FINDINGS: Lower chest: No acute findings.  Chronic interstitial thickening. Hepatobiliary: Several subcentimeter low-attenuation lesions, stable, consistent with cysts. Liver normal in size and overall attenuation. No other masses. Normal gallbladder. No bile duct dilation. Pancreas: No mass or inflammation. Spleen: Normal in size without focal abnormality. Adrenals/Urinary Tract: No adrenal masses. 1.9 cm exophytic low-attenuation mass, upper pole of the right kidney. 1.9 cm low-attenuation mass, midpole the right kidney, both stable and consistent with cysts. No collecting system stones. No hydronephrosis. Ureters normal in course and in caliber. Bladder is distended with irregular wall thickening and cellule formation mostly along the superior aspect. Enlarged prostate bulges into the bladder base. These findings are stable. Stomach/Bowel: Normal stomach. Small bowel and  colon are normal in caliber. No wall thickening. No inflammation. Mild generalized increase in the colonic stool burden. Vascular/Lymphatic: Aorta is tortuous with diffuse atherosclerosis. Is dilated to a maximum of 3.4 cm infrarenal, unchanged. No enlarged lymph nodes. Reproductive: Marked enlargement of prostate, 7.6 x 6.7 x 9.8 cm, unchanged. Other: No hernia.  No ascites. Musculoskeletal: Chronic bilateral pars defects at L5-S1 with a grade 1 to grade 2 anterolisthesis. No acute fracture. No bone lesion. Skeletal structures are demineralized. Advanced degenerative changes noted of the visualized spine. IMPRESSION: 1. No  acute findings within the abdomen or pelvis. 2. Multiple stable chronic findings including marked prostate hypertrophy and bladder changes consistent with chronic bladder outlet obstruction. 3. Abdominal aortic aneurysm, 3.4 cm, stable from the recent prior CT. Recommend follow-up ultrasound every 3 years. This recommendation follows ACR consensus guidelines: White Paper of the ACR Incidental Findings Committee II on Vascular Findings. J Am Coll Radiol 2013; 10:789-794. 4. Mild generalized increase in the colonic stool burden. Electronically Signed   By: Lajean Manes M.D.   On: 02/26/2022 15:38  ? ?DG Chest 2 View ? ?Result Date: 02/26/2022 ?CLINICAL DATA:  Trauma, fall EXAM: CHEST - 2 VIEW COMPARISON:  Previous studies including the examination done on 12/13/2015 FINDINGS: Transverse diameter of heart is slightly increased. Central pulmonary vessels are prominent. There are no signs of alveolar pulmonary edema. Left hemidiaphragm is elevated. There is crowding of markings in the left lower lung fields. There is blunting of left lateral CP angle. IMPRESSION: Linear densities in the left lower lung fields may suggest crowding of markings due to poor inspiration or subsegmental atelectasis. Blunting of left lateral CP angle may be due to small effusion or pleural thickening. Electronically Signed    By: Elmer Picker M.D.   On: 02/26/2022 13:18  ? ?CT HEAD WO CONTRAST ? ?Result Date: 02/26/2022 ?CLINICAL DATA:  Altered mental status EXAM: CT HEAD WITHOUT CONTRAST TECHNIQUE: Contiguous axial images were

## 2022-02-28 NOTE — Progress Notes (Signed)
Returned from MRI via bed, sleeping, drowsy ?

## 2022-02-28 NOTE — Consult Note (Addendum)
?Neurology Consultation   ? ?Reason for Consult: Encephalopathy of unknown etiology  ? ?CC: n/a  ? ?HISTORY OF PRESENT ILLNESS   ?HPI  ?History is obtained from:primary team, chart review. Unable to reach son.  ?Mr. Melody is an 88 year old gentleman with a past medical history of BPH, HTN, HLD, and COPD.  More recently, he has had recurrent orchitis and epididymitis, recurrence from in 2022, for which he has been on antibiotics.  ? ?This admission, he presented on 02/27/22 with acute onset confusion x 2-3 days PTA for which neurology is consulted. There was initially concern for ischemic bowel, which has been ruled out. His course has been complicated by acute thrombocytopenia thought to be secondary to DIC for which hematology is following.  ? ?AMS Completed Workup:  ?Per primary team, patient's mental status has remained the same throughout stay: disoriented, intermittently crying out as if in pain but this does not  always correlate with being touched in any particular location. Sepsis initially suspected, but no clear cause. B12 level, ammonia level unremarkable aside from AKI at time of presentation which is resolving, although still with elevated BUN. EEG showed generalized slowing only. ETOH user but reportedly only drinking socially recently. UA negative. Negative BLLE [place] for ?calf pain. Ammonia leeel 33 and LFTs WNL.  ? ?Ongoing workup:  ?-MRI pelvis for concern of fluid collection bulbar urethra, rule out abscess  ? ?Examination is extremely limited, see below.  ? ?[id number] to obtain due to altered mental status.  ? ?PAST MEDICAL HISTORY   ? ?Past Medical History:  ?Past Medical History:  ?Diagnosis Date  ? Arthritis   ? Back pain, chronic   ? BPH (benign prostatic hypertrophy) with urinary obstruction   ? COPD (chronic obstructive pulmonary disease) ([place])   ? Elevated PSA 10/16/2010  ? H/O prostate biopsy   ? Hemorrhoids, external   ? Hyperlipidemia   ? Hypertension   ? ? ?No family history on  file. ?Family History  ?Problem Relation Age of Onset  ? CVA Mother   ? Cancer Father   ?     brain tumor  ? ? ?Allergies:  ?Allergies  ?Allergen Reactions  ? Penicillins Anaphylaxis  ? ? ?Social History:  ? reports that he quit smoking about 16 years ago. His smoking use included cigarettes. He has a 30.00 pack-year smoking history. He has never used smokeless tobacco. He reports current alcohol use. He reports that he does not use drugs.   ? ?Medications ?Medications Prior to Admission  ?Medication Sig Dispense Refill  ? acetaminophen (TYLENOL) 500 MG tablet Take 1,000 mg by mouth every 6 (six) hours as needed for mild pain.    ? finasteride (PROSCAR) 5 MG tablet TAKE 1 TABLET (5 MG TOTAL) BY MOUTH DAILY. (Patient taking differently: Take 5 mg by mouth daily.) 30 tablet 0  ? naproxen sodium (ALEVE) 220 MG tablet Take 220 mg by mouth daily as needed (pain).    ? OVER THE COUNTER MEDICATION Take 2 capsules by mouth 2 (two) times daily. PROSTA-STRONG (saw palmetto,lycopene,pumpkin seed extract)    ? sulfamethoxazole-trimethoprim (BACTRIM DS) 800-160 MG tablet Take 1 tablet by mouth 2 (two) times daily for 10 days. 20 tablet 0  ? tamsulosin (FLOMAX) 0.4 MG CAPS capsule TAKE ONE CAPSULE BY MOUTH EVERY DAY AFTER SUPPER (Patient taking differently: Take 0.4 mg by mouth daily after supper.) 90 capsule 0  ? WIXELA INHUB 250-50 MCG/ACT AEPB Inhale 1 puff into the lungs 2 (two) times  daily.    ? albuterol (PROVENTIL HFA;VENTOLIN HFA) 108 (90 Base) MCG/ACT inhaler Inhale 2 puffs into the lungs every 6 (six) hours as needed for wheezing or shortness of breath. (Patient not taking: Reported on 02/28/2022) 1 Inhaler 3  ? ? ?EXAMINATION   ? ?Current vital signs: ? ?  02/28/2022  ?  8:55 AM 02/28/2022  ?  5:00 AM 02/28/2022  ?  4:00 AM  ?Vitals with BMI  ?Systolic [id number]  [id number]  ?Diastolic 73  48  ?Pulse 62 59 52  ? ? ?Examination:  ?GENERAL: alert, appears to be in distress , crying out intermittently and when any part of his body  palpated even gently. Closes eyes intermittently. Slight right gaze preference. Barely crosses midline towards left but unable to test vestibuloocular reflex due to patient resistance.   ?HEENT: - Normocephalic and atraumatic, dry mm, no lymphadenopathy, no Thyromegally ?LUNGS - Clear to auscultation bilaterally ?CV - S1S2 RRR, equal pulses bilaterally. ?ABDOMEN - Soft, nontender, nondistended with normoactive BS ?Ext: warm, well perfused, intact peripheral pulses, no pedal edema ? ?NEURO:  ?Mental Status: alert, unable to test orientation due to AMS. No utilizable speech, only vocalizations/noises.  ?Language: speech is not present.  Does not name, repeat, orr comprehend.  ?Cranial Nerves:  ?II: PERRL. +blink to threat x 4 quadrants  ?III, IV, VI: right gaze preference as above. Eyelids elevate symmetrically.  ?V: unable to assess  ?VII: no facial asymmetry   ?VIII: unable to assess , does not track  ?IX, X: unable to assess .  ?XI: unable to assess  ?XII: unable to assess  ?Motor:  ?Unable to assess power testing, but on functional testing, patient with antigravity strength RUE, BLLEs. He is seen using his right hand to support left upper extremity. Does not localize painful stimulus in LUE.  ?Tone: is somewhat increased, but likely paratonia ; bulk is decreased throughout  ?DTRs:did not assess  ?Sensation- appears intact x 3 limbs aside from left upper extremity  ?Coordination: FTN intact bilaterally, no ataxia in BLE.LUE> RUE myoclonus with action.  ?Gait- deferred ? ?LABS  ? ?I have reviewed labs in epic and the results pertinent to this consultation are: ? ?Lab Results  ?Component Value Date  ? [place] 96 05/13/2014  ? ?Lab Results  ?Component Value Date  ? ALT 26 02/28/2022  ? AST 37 02/28/2022  ? ALKPHOS 47 02/28/2022  ? BILITOT 0.3 02/28/2022  ? ?No results found for: HGBA1C ?Lab Results  ?Component Value Date  ? WBC 3.4 (L) 02/28/2022  ? HGB 11.4 (L) 02/28/2022  ? HCT 35.4 (L) 02/28/2022  ? MCV 87.8  02/28/2022  ? PLT 52 (L) 02/28/2022  ? PLT 54 (L) 02/28/2022  ? ?Lab Results  ?Component Value Date  ? [id number] 02/27/2022  ? ?No results found for: FOLATE ?Lab Results  ?Component Value Date  ? NA 138 02/28/2022  ? K 3.9 02/28/2022  ? CL 111 02/28/2022  ? CO2 21 (L) 02/28/2022  ? ? ?DIAGNOSTIC IMAGING/PROCEDURES  ? ?I have reviewed the images obtained:, as below   ? ?CT-head: severe atrophy, especially cortical areas. Motion degraded and limited, but within these limitations, no acute infarction or hemorrhage ascertained on author's view  ? ?CT abdomen/pelvis WO contrast:  ?1. No acute findings within the abdomen or pelvis. ?2. Multiple stable chronic findings including marked prostate ?hypertrophy and bladder changes consistent with chronic bladder ?outlet obstruction. ?3. Abdominal aortic aneurysm, 3.4 cm, stable from the recent prior ?CT. Recommend  follow-up ultrasound every 3 years. This ?recommendation follows ACR consensus guidelines: White Paper of the ?ACR Incidental Findings Committee II on Vascular Findings. J Am Coll ?Radiol 2013JB:6262728. ?4. Mild generalized increase in the colonic stool burden. ? ?MRI brain pending  ? ?EEG: No clear posterior dominant rhythm was seen.  EEG showed continuous generalized 3 to 5 Hz theta-delta slowing.  Hyperventilation and photic stimulation were not performed.    ?--> diffuse slowing suggestive of toxic-metabolic changes  ? ?ASSESSMENT/PLAN   ? ?Assessment: Mr. Raveling is an 88 year old gentleman with a past medical history of BPH, HTN, HLD, COPD, and recurrent orchitis and epididymitis who presents 5/15 with 2-3 day history of altered mental status. ?- Exam localizes as diffuse hypofunction of the bilateral cerebral hemispheres.  ?- While presence of action-related diffuse myoclonus and general encephalopathy points towards systemic source such as infection; he does have a right gaze preference and favors right arm on author's examination today.  ?- While AKI  could cause confusion in this gentleman with poor substrate as evidenced by severe cortical atrophy, these focal findings could point towards right MCA infarction, particularly in thalamus with alteration in mental stat

## 2022-03-01 ENCOUNTER — Inpatient Hospital Stay (HOSPITAL_COMMUNITY): Payer: Medicare Other

## 2022-03-01 DIAGNOSIS — N453 Epididymo-orchitis: Secondary | ICD-10-CM | POA: Diagnosis not present

## 2022-03-01 DIAGNOSIS — G9340 Encephalopathy, unspecified: Secondary | ICD-10-CM | POA: Diagnosis not present

## 2022-03-01 DIAGNOSIS — N452 Orchitis: Secondary | ICD-10-CM | POA: Diagnosis not present

## 2022-03-01 DIAGNOSIS — Z7189 Other specified counseling: Secondary | ICD-10-CM | POA: Diagnosis not present

## 2022-03-01 DIAGNOSIS — Z515 Encounter for palliative care: Secondary | ICD-10-CM | POA: Diagnosis not present

## 2022-03-01 DIAGNOSIS — N451 Epididymitis: Secondary | ICD-10-CM | POA: Diagnosis not present

## 2022-03-01 DIAGNOSIS — M79661 Pain in right lower leg: Secondary | ICD-10-CM | POA: Diagnosis not present

## 2022-03-01 DIAGNOSIS — E43 Unspecified severe protein-calorie malnutrition: Secondary | ICD-10-CM | POA: Insufficient documentation

## 2022-03-01 DIAGNOSIS — N179 Acute kidney failure, unspecified: Secondary | ICD-10-CM | POA: Diagnosis not present

## 2022-03-01 LAB — DIC (DISSEMINATED INTRAVASCULAR COAGULATION)PANEL
D-Dimer, Quant: 12.1 ug/mL-FEU — ABNORMAL HIGH (ref 0.00–0.50)
D-Dimer, Quant: 9.29 ug/mL-FEU — ABNORMAL HIGH (ref 0.00–0.50)
Fibrinogen: 77 mg/dL — CL (ref 210–475)
Fibrinogen: 505 mg/dL — ABNORMAL HIGH (ref 210–475)
INR: 1.4 — ABNORMAL HIGH (ref 0.8–1.2)
INR: 1.7 — ABNORMAL HIGH (ref 0.8–1.2)
Platelets: 54 10*3/uL — ABNORMAL LOW (ref 150–400)
Platelets: 74 10*3/uL — ABNORMAL LOW (ref 150–400)
Prothrombin Time: 17.4 seconds — ABNORMAL HIGH (ref 11.4–15.2)
Prothrombin Time: 20.2 seconds — ABNORMAL HIGH (ref 11.4–15.2)
Smear Review: NONE SEEN
Smear Review: NONE SEEN
aPTT: 36 seconds (ref 24–36)
aPTT: 38 seconds — ABNORMAL HIGH (ref 24–36)

## 2022-03-01 LAB — CBC WITH DIFFERENTIAL/PLATELET
Abs Immature Granulocytes: 0.05 10*3/uL (ref 0.00–0.07)
Basophils Absolute: 0.2 10*3/uL — ABNORMAL HIGH (ref 0.0–0.1)
Basophils Relative: 2 %
Eosinophils Absolute: 0 10*3/uL (ref 0.0–0.5)
Eosinophils Relative: 0 %
HCT: 40.2 % (ref 39.0–52.0)
Hemoglobin: 13 g/dL (ref 13.0–17.0)
Immature Granulocytes: 1 %
Lymphocytes Relative: 72 %
Lymphs Abs: 5.6 10*3/uL — ABNORMAL HIGH (ref 0.7–4.0)
MCH: 28.6 pg (ref 26.0–34.0)
MCHC: 32.3 g/dL (ref 30.0–36.0)
MCV: 88.5 fL (ref 80.0–100.0)
Monocytes Absolute: 0.4 10*3/uL (ref 0.1–1.0)
Monocytes Relative: 6 %
Neutro Abs: 1.4 10*3/uL — ABNORMAL LOW (ref 1.7–7.7)
Neutrophils Relative %: 19 %
Platelets: 73 10*3/uL — ABNORMAL LOW (ref 150–400)
RBC: 4.54 MIL/uL (ref 4.22–5.81)
RDW: 14.8 % (ref 11.5–15.5)
Smear Review: DECREASED
WBC Morphology: >10
WBC: 7.6 10*3/uL (ref 4.0–10.5)
nRBC: 0 % (ref 0.0–0.2)

## 2022-03-01 LAB — COMPREHENSIVE METABOLIC PANEL
ALT: 25 U/L (ref 0–44)
AST: 40 U/L (ref 15–41)
Albumin: 2 g/dL — ABNORMAL LOW (ref 3.5–5.0)
Alkaline Phosphatase: 47 U/L (ref 38–126)
Anion gap: 6 (ref 5–15)
BUN: 27 mg/dL — ABNORMAL HIGH (ref 8–23)
CO2: 21 mmol/L — ABNORMAL LOW (ref 22–32)
Calcium: 8.8 mg/dL — ABNORMAL LOW (ref 8.9–10.3)
Chloride: 116 mmol/L — ABNORMAL HIGH (ref 98–111)
Creatinine, Ser: 0.98 mg/dL (ref 0.61–1.24)
GFR, Estimated: >60 mL/min (ref >60)
Glucose, Bld: 78 mg/dL (ref 70–99)
Potassium: 4.1 mmol/L (ref 3.5–5.1)
Sodium: 143 mmol/L (ref 135–145)
Total Bilirubin: 0.2 mg/dL — ABNORMAL LOW (ref 0.3–1.2)
Total Protein: 4.7 g/dL — ABNORMAL LOW (ref 6.5–8.1)

## 2022-03-01 LAB — PHOSPHORUS: Phosphorus: 1.9 mg/dL — ABNORMAL LOW (ref 2.5–4.6)

## 2022-03-01 LAB — MAGNESIUM: Magnesium: 2.2 mg/dL (ref 1.7–2.4)

## 2022-03-01 LAB — GLUCOSE, CAPILLARY: Glucose-Capillary: 90 mg/dL (ref 70–99)

## 2022-03-01 LAB — PATHOLOGIST SMEAR REVIEW: Path Review: REACTIVE

## 2022-03-01 MED ORDER — FREE WATER
150.0000 mL | Status: DC
Start: 2022-03-01 — End: 2022-03-03
  Administered 2022-03-01 – 2022-03-03 (×13): 150 mL

## 2022-03-01 MED ORDER — THIAMINE HCL 100 MG/ML IJ SOLN
500.0000 mg | Freq: Three times a day (TID) | INTRAVENOUS | Status: AC
  Administered 2022-03-01 – 2022-03-04 (×9): 500 mg via INTRAVENOUS
  Filled 2022-03-01 (×10): qty 5

## 2022-03-01 MED ORDER — PROSOURCE TF PO LIQD
45.0000 mL | Freq: Two times a day (BID) | ORAL | Status: DC
  Administered 2022-03-01 – 2022-03-03 (×4): 45 mL
  Filled 2022-03-01 (×4): qty 45

## 2022-03-01 MED ORDER — JEVITY 1.5 CAL/FIBER PO LIQD
1000.0000 mL | ORAL | Status: DC
  Administered 2022-03-01 – 2022-03-03 (×2): 1000 mL
  Filled 2022-03-01 (×4): qty 1000

## 2022-03-01 NOTE — Progress Notes (Signed)
Subjective: Patient is poorly responsive, would not answer questions.  Had MRI scan yesterday showing no evidence of periurethral abscess.  Prostate is enlarged but no evidence of scrotal abscess either.  Foley catheter in place draining clear urine.  Hydrocele aspirated showed no growth on culture.   Objective: Vital signs in last 24 hours: Temp:  [97.6 F (36.4 C)-98.7 F (37.1 C)] 98.3 F (36.8 C) (05/17 0753) Pulse Rate:  [47-75] 71 (05/17 0753) Resp:  [16-19] 18 (05/17 0753) BP: (105-128)/(46-76) 126/76 (05/17 0753) SpO2:  [90 %-96 %] 94 % (05/17 0912)  Intake/Output from previous day: 05/16 0701 - 05/17 0700 In: 250 [IV Piggyback:250] Out: 851 [Urine:851] Intake/Output this shift: No intake/output data recorded.  Physical Exam:  Scrotal exam, no evidence of hydrocele.  There is no evidence of erythema or fluctuance.  Minimally tender to exam in today.  Testicle somewhat indurated and firm but again no evidence of fluctuance or cellulitis.  Foley catheter in place draining clear urine  Lab Results: Recent Labs    02/27/22 0408 02/28/22 0120 03/01/22 0125  HGB 11.6* 11.4* 13.0  HCT 34.1* 35.4* 40.2   BMET Recent Labs    02/28/22 0120 03/01/22 0125  NA 138 143  K 3.9 4.1  CL 111 116*  CO2 21* 21*  GLUCOSE 90 78  BUN 28* 27*  CREATININE 1.04 0.98  CALCIUM 8.3* 8.8*     Studies/Results: MR BRAIN WO CONTRAST  Result Date: 02/28/2022 CLINICAL DATA:  Mental status change. EXAM: MRI HEAD WITHOUT CONTRAST TECHNIQUE: Multiplanar, multiecho pulse sequences of the brain and surrounding structures were obtained without intravenous contrast. COMPARISON:  None Available. FINDINGS: Brain: No acute infarct, mass effect or extra-axial collection. Chronic Microhemorrhage in the left frontal operculum. There is multifocal hyperintense T2-weighted signal within the white matter. Generalized cerebral volume loss. The midline structures are normal. Vascular: Major flow voids are  preserved. Skull and upper cervical spine: Normal calvarium and skull base. Visualized upper cervical spine and soft tissues are normal. Sinuses/Orbits:No paranasal sinus fluid levels or advanced mucosal thickening. No mastoid or middle ear effusion. Normal orbits. IMPRESSION: 1. No acute intracranial abnormality. 2. Generalized cerebral volume loss and chronic small vessel disease. Electronically Signed   By: Deatra Robinson M.D.   On: 02/28/2022 22:30   MR PELVIS W WO CONTRAST  Result Date: 02/28/2022 CLINICAL DATA:  Orchitis and epididymitis. Evaluate possible lesion at the base of the urethra on CT (per urology note). EXAM: MRI PELVIS WITHOUT AND WITH CONTRAST TECHNIQUE: Multiplanar multisequence MR imaging of the pelvis was performed both before and after administration of intravenous contrast. CONTRAST:  6mL GADAVIST GADOBUTROL 1 MMOL/ML IV SOLN COMPARISON:  Scrotal ultrasound dated 02/27/2022. CT abdomen/pelvis dated 02/26/2022. FINDINGS: Markedly limited evaluation due to patient motion. Urinary Tract: Thick-walled bladder, decompressed by an indwelling Foley catheter. Bowel:  Visualized bowel is grossly unremarkable. Vascular/Lymphatic: No evidence of aneurysm. No suspicious pelvic lymphadenopathy. Reproductive: Prostatomegaly, with marked enlargement of the central gland indenting the base of the bladder, reflecting BPH. Right scrotal hydrocele (series 9/image 18), poorly visualized. No abnormality of the penile urethra. Specifically, no findings suspicious for periurethral abscess on MR. Other:  Small volume pelvic ascites. Musculoskeletal: Degenerative changes of the lumbar spine, with grade 1 anterolisthesis of L5 on S1. IMPRESSION: Markedly limited evaluation due to patient motion. No findings suspicious for periurethral abscess on MR. Right scrotal hydrocele, poorly visualized, better evaluated recent scrotal ultrasound. Marked BPH. Thick-walled bladder, decompressed by an indwelling Foley catheter.  Electronically Signed  By: Charline Bills M.D.   On: 02/28/2022 21:43   EEG adult  Result Date: 02/27/2022 Charlsie Quest, MD     02/27/2022 11:49 AM Patient Name: Kenneth Woods MRN: [id number] Epilepsy Attending: Charlsie Quest Referring Physician/Provider: Maretta Bees, MD Date: 02/27/2022 Duration: 22.05 mins Patient history: 88 year old male with altered mental status.  EEG to evaluate for seizure. Level of alertness: Awake AEDs during EEG study: None Technical aspects: This EEG study was done with scalp electrodes positioned according to the 10-20 International system of electrode placement. Electrical activity was acquired at a sampling rate of 500Hz  and reviewed with a high frequency filter of 70Hz  and a low frequency filter of 1Hz . EEG data were recorded continuously and digitally stored. Description: No clear posterior dominant rhythm was seen.  EEG showed continuous generalized 3 to 5 Hz theta-delta slowing.  Hyperventilation and photic stimulation were not performed.   ABNORMALITY - Continuous slow, generalized IMPRESSION: This study is suggestive of moderate diffuse encephalopathy, nonspecific etiology. No seizures or epileptiform discharges were seen throughout the recording. Priyanka O Yadav   VAS [place] LOWER EXTREMITY VENOUS (DVT)  Result Date: 02/27/2022  Lower Venous DVT Study Patient Name:  Kenneth Woods [facility]  Date of Exam:   02/27/2022 Medical Rec #: [id number]        Accession #:    [id number] Date of Birth: December 07, 1933        Patient Gender: M Patient Age:   88 years Exam Location:  [facility] Procedure:      VAS [place] LOWER EXTREMITY VENOUS (DVT) Referring Phys: Lyda Perone --------------------------------------------------------------------------------  Indications: Pain.  Risk Factors: Orchitis and epididymitis for the past month. Limitations: Movement, non compliant. Comparison Study: No prior study on file Performing Technologist: Sherren Kerns RVS  Examination  Guidelines: A complete evaluation includes B-mode imaging, spectral Doppler, color Doppler, and power Doppler as needed of all accessible portions of each vessel. Bilateral testing is considered an integral part of a complete examination. Limited examinations for reoccurring indications may be performed as noted. The reflux portion of the exam is performed with the patient in reverse Trendelenburg.  +---------+---------------+---------+-----------+----------+--------------+ RIGHT    CompressibilityPhasicitySpontaneityPropertiesThrombus Aging +---------+---------------+---------+-----------+----------+--------------+ CFV      Full           Yes      Yes                                 +---------+---------------+---------+-----------+----------+--------------+ SFJ      Full                                                        +---------+---------------+---------+-----------+----------+--------------+ FV Prox  Full                                                        +---------+---------------+---------+-----------+----------+--------------+ FV Mid   Full                                                        +---------+---------------+---------+-----------+----------+--------------+  FV DistalFull                                                        +---------+---------------+---------+-----------+----------+--------------+ PFV      Full                                                        +---------+---------------+---------+-----------+----------+--------------+ POP      Full           Yes      Yes                                 +---------+---------------+---------+-----------+----------+--------------+ PTV      Full                                                        +---------+---------------+---------+-----------+----------+--------------+ PERO     Full                                                         +---------+---------------+---------+-----------+----------+--------------+   +---------+---------------+---------+-----------+----------+--------------+ LEFT     CompressibilityPhasicitySpontaneityPropertiesThrombus Aging +---------+---------------+---------+-----------+----------+--------------+ CFV      Full           Yes      Yes                                 +---------+---------------+---------+-----------+----------+--------------+ SFJ      Full                                                        +---------+---------------+---------+-----------+----------+--------------+ FV Prox  Full                                                        +---------+---------------+---------+-----------+----------+--------------+ FV Mid   Full                                                        +---------+---------------+---------+-----------+----------+--------------+ FV DistalFull                                                        +---------+---------------+---------+-----------+----------+--------------+  PFV      Full                                                        +---------+---------------+---------+-----------+----------+--------------+ POP                     Yes      Yes                                 +---------+---------------+---------+-----------+----------+--------------+ PTV      Full                                                        +---------+---------------+---------+-----------+----------+--------------+ PERO     Full                                                        +---------+---------------+---------+-----------+----------+--------------+     Summary: BILATERAL: - No evidence of deep vein thrombosis seen in the lower extremities, bilaterally. -No evidence of popliteal cyst, bilaterally.   *See table(s) above for measurements and observations. Electronically signed by Waverly Ferrari MD on 02/27/2022 at  4:22:05 PM.    Final     Assessment/Plan: Right hydrocele status post aspiration with negative culture on aspirate.  Was likely reactive hydrocele to orchitis but physical exam findings show firm testicle with minimal tenderness this may be smoldering orchitis as firmness may be persistent for several weeks after adequate treatment with antibiotics. 2.  BPH with urinary retention.  Recommend leaving Foley catheter until patient cognition improves I will make outpatient follow-up.  Plan to sign off for now until further disposition on cognitive process going on.  Please let urology know when patient getting towards discharge so we can arrange outpatient follow-up-discussed with hospitalist at bedside today.    LOS: 3 days   Belva Agee 03/01/2022, 11:01 AM

## 2022-03-01 NOTE — Progress Notes (Addendum)
?      ?                 PROGRESS NOTE ? ?      ?PATIENT DETAILS ?Name: Kenneth Woods ?Age: 88 y.o. ?Sex: male ?Date of Birth: 1934-07-07 ?Admit Date: 02/26/2022 ?Admitting Physician Ollen Bowl, MD ?YIR:SWNIOEV, Priscille Heidelberg, MD ? ?Brief Summary: ?Patient is a 88 y.o.  male with history of BPH, recent history of right epididymitis-has completed a course of Levaquin-most recently placed on Bactrim on 5/4 ED visit-presented to the hospital with altered mental status-subsequently admitted to the hospitalist service for further evaluation and treatment. ? ?Significant events: ?5/14>> admit to [facility] for AMS/leukopenia/thrombocytopenia.  Acute urinary retention-requiring Foley catheter insertion. ? ?Significant studies: ?5/14>> CT head: No acute findings. ?5/14>> CT abdomen/pelvis: No acute findings-marked prostate hypertrophy/chronic bladder outlet obstruction. ?5/14>> x-ray left knee: No fracture/dislocation ?5/14>> x-ray hip/pelvis: No fracture/dislocation ?5/14>> CXR: No pneumonia ?5/14>>NH4: normal ?5/15>>B12 level:normal ?5/15>>B1 level:pending ?5/15>> ultrasound scrotum: No torsion-stable complex right hydrocele with mass effect on the right testes. ?5/15>>EEG:no seizures ?5/15>> lower extremity Doppler: No DVT. ?5/16>> MRI pelvis: No findings suspicious of periurethral abscess. ?5/16>> MRI brain: No acute infarct. ? ?Significant microbiology data: ?5/14>> blood culture: No growth ?5/14>> urine culture: No growth ?5/15>> COVID/influenza PCR: Negative ?5/15>> hydrocele culture: No growth ? ?Procedures: ?5/15>> bedside aspiration from right hydrocele by urology ? ?Consults: ?Urology, hematology, neurology, palliative care, infectious disease ? ?Subjective: ?Remains confused-seems to be a bit more awake compared to yesterday-opening eyes-will use profanities per nursing staff if he is starts in some places.  Does not follow any commands. ? ?Objective: ?Vitals: ?Blood pressure 126/76, pulse 71, temperature 98.3 ?F  (36.8 ?C), temperature source Axillary, resp. rate 18, height 5\' 5"  (1.651 m), weight 62.2 kg, SpO2 94 %.  ? ?Exam: ?Gen Exam: Confused but not in any distress. ?HEENT:atraumatic, normocephalic ?Chest: B/L clear to auscultation anteriorly ?CVS:S1S2 regular ?Abdomen:soft non tender, non distended ?Extremities:no edema ?Neurology: Difficult exam but moves all 4 extremities. ?Skin: no rash  ? ?Pertinent Labs/Radiology: ? ?  Latest Ref Rng & Units 03/01/2022  ?  1:25 AM 02/28/2022  ?  1:20 AM 02/27/2022  ?  4:08 AM  ?CBC  ?WBC 4.0 - 10.5 K/uL 7.6   3.4   3.0    ?Hemoglobin 13.0 - 17.0 g/dL 03/01/2022   03.5   00.9    ?Hematocrit 39.0 - 52.0 % 40.2   35.4   34.1    ?Platelets 150 - 400 K/uL 74    ? 73   52    ? 54   39    ?  ?Lab Results  ?Component Value Date  ? NA 143 03/01/2022  ? K 4.1 03/01/2022  ? CL 116 (H) 03/01/2022  ? CO2 21 (L) 03/01/2022  ? ?  ? ? ?Assessment/Plan: ?Acute metabolic encephalopathy: Etiology unclear-neuroimaging negative for acute abnormalities-EEG negative for seizures.  Other work-up as above.  Due to some concern for possible Warnicke's encephalopathy-on high-dose IV thiamine (per son-used to drink heavily till last year-now only drinks socially).  Do not think patient had any clinical features consistent with sepsis at this point-doubt this is the cause of encephalopathy.  He seems to be a bit more awake today but still not following commands-we will await further recommendations from neurology.  ? ?Discussed with son 03/03/2022 at bedside-patient has not had any oral intake due to severe encephalopathy-we will go ahead and consult dietitian for cortrak tube placement and tube  feedings.    ? ?Pancytopenia-mild coagulopathy-possible DIC: Counts improving-fibrinogen levels are actually better today.  Continue to watch closely-hematology following. ? ?Recurrent right epididymitis-reactive hydrocele: Had completed 2 courses of fluoroquinolone in the outpatient setting-Bactrim started on 5/4 on ED visit.   Clinically does not have any significant epididymitis on exam.  Has a reactive hydrocele-that was aspirated at bedside by urology-cultures negative as well.  Given concern for possible infectious etiology it could be causing DIC-empirically on Rocephin/doxycycline.  I really do not think that patient has any significant sepsis physiology at this point.  ID following.  MRI pelvis negative for any periurethral abscess. ? ?Note-sepsis ruled out. ? ?AKI: Mild-resolved-probably hemodynamic related kidney injury and from acute urinary retention. ? ?Acute urinary retention: S/p Foley catheter inserted in the ED-given severe encephalopathy-continue Foley catheter.  Already on finasteride/Flomax for history of BPH.  Discussed with urology on 5/17-continue Foley catheter on discharge-has a very large prostate-urology will address Foley catheter issue on follow-up in the office. ? ?BPH: Continue finasteride/Flomax ? ?Bilateral calf pain: Dopplers negative. ? ?HTN: BP stable without the use of any antihypertensives-follow-up. ? ?3.4 cm abdominal aortic aneurysm: Defer further to the outpatient setting ? ?Palliative care: Full code for now-discussed with son-continue full scope of treatment.  Given the possibility of patient going into DIC-diagnostic uncertainty-severity of his clinical situation-have asked patient's son to continue to engage with other family members regarding goals of care-in case patient deteriorates further.  I have consulted palliative care as well. ? ? ?Nutrition Status: ?Nutrition Problem: Severe Malnutrition ?Etiology: chronic illness (COPD) ?Signs/Symptoms: severe muscle depletion, severe fat depletion ?Interventions: Tube feeding, MVI, Prostat ?  ? ?BMI: ?Estimated body mass index is 22.82 kg/m? as calculated from the following: ?  Height as of this encounter: 5\' 5"  (1.651 m). ?  Weight as of this encounter: 62.2 kg.  ? ?Code status: ?  Code Status: Full Code  ? ?DVT Prophylaxis: ?SCDs Start: 02/27/22  0102 ?  ?Family Communication: [name] [phone]-updated over the phone 5/15, son Onalee HuaDavid at bedside on 5/16. ? ? ?Disposition Plan: ?Status is: Inpatient ?Remains inpatient appropriate because: Severe encephalopathy-not yet at baseline-we will require several days of hospitalization. ?  ?Planned Discharge Destination:Skilled nursing facility vs SNF ? ? ?Diet: ?Diet Order   ? ?       ?  Diet NPO time specified Except for: Sips with Meds  Diet effective now       ?  ? ?  ?  ? ?  ?  ? ? ?Antimicrobial agents: ?Anti-infectives (From admission, onward)  ? ? Start     Dose/Rate Route Frequency Ordered Stop  ? 02/28/22 1400  doxycycline (VIBRAMYCIN) 100 mg in sodium chloride 0.9 % 250 mL IVPB       ? 100 mg ?125 mL/hr over 120 Minutes Intravenous Every 12 hours 02/28/22 1314    ? 02/28/22 1230  doxycycline (VIBRA-TABS) tablet 100 mg  Status:  Discontinued       ? 100 mg Oral Every 12 hours 02/28/22 1133 02/28/22 1314  ? 02/27/22 1700  vancomycin (VANCOREADY) IVPB 750 mg/150 mL  Status:  Discontinued       ? 750 mg ?150 mL/hr over 60 Minutes Intravenous Every 24 hours 02/26/22 1638 02/27/22 1018  ? 02/27/22 1115  cefTRIAXone (ROCEPHIN) 2 g in sodium chloride 0.9 % 100 mL IVPB       ? 2 g ?200 mL/hr over 30 Minutes Intravenous Every 24 hours 02/27/22 1021    ?  02/26/22 2200  ceFEPIme (MAXIPIME) 2 g in sodium chloride 0.9 % 100 mL IVPB  Status:  Discontinued       ? 2 g ?200 mL/hr over 30 Minutes Intravenous Every 12 hours 02/26/22 1618 02/27/22 1018  ? 02/26/22 1615  vancomycin (VANCOCIN) IVPB 1000 mg/200 mL premix       ? 1,000 mg ?200 mL/hr over 60 Minutes Intravenous  Once 02/26/22 1601 02/26/22 1811  ? ?  ? ? ? ?MEDICATIONS: ?Scheduled Meds: ? Chlorhexidine Gluconate Cloth  6 each Topical Daily  ? finasteride  5 mg Oral Daily  ? tamsulosin  0.4 mg Oral Daily  ? ?Continuous Infusions: ? sodium chloride 100 mL/hr at 03/01/22 1694  ? cefTRIAXone (ROCEPHIN)  IV 2 g (02/28/22 1253)  ? doxycycline (VIBRAMYCIN) IV  100 mg (03/01/22 0325)  ? thiamine injection 500 mg (03/01/22 0856)  ? ?PRN Meds:.albuterol, LORazepam, ondansetron **OR** ondansetron (ZOFRAN) IV ? ? ?I have personally reviewed following labs and imaging st

## 2022-03-01 NOTE — Consult Note (Signed)
? ?  [facility] CM Inpatient Consult ? ? ?03/01/2022 ? ?Kenneth Woods ?05-09-1934 ?[id number] ? ? ?[employer] [facility] [[facility]] Patient: [name] ? ?Primary Care Provider:  No primary care provider on file. ?*NO PCP noted with [facility] per encounters listed with Jenna Luo but no encounters ? ?Patient screened for high ED utilizer 4 ED visits in the past 6 months hospitalization  to assess for potential [facility] service needs for post hospital transition.  Review of patient's medical record reveals patient remains confused on rounds.  Palliative consult noted. ? ? ?Plan:  Check for eligibility as ongoing disposition is not determined ? ?For questions contact:  ? ?Natividad Brood, RN BSN CCM ?[facility] Liaison ? [phone] business mobile phone ?Toll free office [phone]  ?Fax number: [phone] ?[email or web address] ?[email or web address]  ? ? ? ?

## 2022-03-01 NOTE — Care Management Important Message (Signed)
Important Message ? ?Patient Details  ?Name: Kenneth Woods ?MRN: [id number] ?Date of Birth: 1934-02-09 ? ? ?Medicare Important Message Given:  Yes ? ? ? ? ?[name] ?03/01/2022, 1:14 PM ?

## 2022-03-01 NOTE — Progress Notes (Signed)
HEMATOLOGY-ONCOLOGY PROGRESS NOTE ? ?ASSESSMENT AND PLAN: ?1.  Thrombocytopenia ?2.  Mild leukopenia ?3.  Acute encephalopathy ?4.  Recurrent right epididymitis/hydrocele ?5.  Possible fluid collection at the bulbar urethra ?6.  AKI ?7.  Acute urinary retention ?8.  BPH ?9.  Hypertension ?10.  3.4 cm abdominal aortic aneurysm ?11.  COPD ?12.  DIC ? ?Kenneth Woods remains confused.  His overall status appears unchanged.  The platelet count is higher today.  The lymphocytes are mildly elevated.  The fibrinogen is higher.  No clear source for infection has been identified.  Cultures remain negative. ? ?Recommendations: ?1.  Antibiotics per infectious disease ?2.  Monitor CBC for continued platelet count recovery and to follow-up on the lymphocytosis ?3.  Mental status evaluation per the medical service and neurology ? ?SUBJECTIVE: He was sleeping when I entered the room early this morning. ? ? ?PHYSICAL EXAMINATION: ? ?Vitals:  ? 03/01/22 0600 03/01/22 0753  ?BP:  126/76  ?Pulse: 60 71  ?Resp:  18  ?Temp:  98.3 ?F (36.8 ?C)  ?SpO2: 95% 95%  ? ?Filed Weights  ? 02/26/22 1520 02/26/22 2208  ?Weight: 150 lb (68 kg) 137 lb 2 oz (62.2 kg)  ? ? ?Intake/Output from previous day: ?05/16 0701 - 05/17 0700 ?In: 250 [IV Piggyback:250] ?Out: 851 [Urine:851] ? ?Physical exam: ? ?Sleeping, opens eyes to sternal rub, moans, otherwise nonverbal ?HEENT-the mouth is dry, no bleeding ?Abdomen-soft, nontender ?Vascular-no leg edema, thickened areas at the medial aspect of the left lower leg-palpable cord?  No erythema ?Musculoskeletal: Tender at the left lower leg diffusely ?GU: Enlargement of the right scrotum with tenderness ?Skin-petechiae at the lower leg bilaterally ? ?LABORATORY DATA:  ?I have reviewed the data as listed ? ?  Latest Ref Rng & Units 03/01/2022  ?  1:25 AM 02/28/2022  ?  1:20 AM 02/27/2022  ?  1:23 AM  ?CMP  ?Glucose 70 - 99 mg/dL 78   90   97    ?BUN 8 - 23 mg/dL 27   28   35    ?Creatinine 0.61 - 1.24 mg/dL 0.98   1.04    0.97    ?Sodium 135 - 145 mmol/L 143   138   134    ?Potassium 3.5 - 5.1 mmol/L 4.1   3.9   4.4    ?Chloride 98 - 111 mmol/L 116   111   107    ?CO2 22 - 32 mmol/L 21   21   20     ?Calcium 8.9 - 10.3 mg/dL 8.8   8.3   8.5    ?Total Protein 6.5 - 8.1 g/dL 4.7   4.5   5.2    ?Total Bilirubin 0.3 - 1.2 mg/dL 0.2   0.3   0.5    ?Alkaline Phos 38 - 126 U/L 47   47   57    ?AST 15 - 41 U/L 40   37   47    ?ALT 0 - 44 U/L 25   26   32    ? ? ?Lab Results  ?Component Value Date  ? WBC 7.6 03/01/2022  ? HGB 13.0 03/01/2022  ? HCT 40.2 03/01/2022  ? MCV 88.5 03/01/2022  ? PLT 74 (L) 03/01/2022  ? PLT 73 (L) 03/01/2022  ? NEUTROABS 1.4 (L) 03/01/2022  ? ? ?No results found for: CEA1, CEA, [id number], CA125, PSA1 ? ?CT Abdomen Pelvis Wo Contrast ? ?Result Date: 02/26/2022 ?CLINICAL DATA:  Generalized abdominal pain. EXAM: CT  ABDOMEN AND PELVIS WITHOUT CONTRAST TECHNIQUE: Multidetector CT imaging of the abdomen and pelvis was performed following the standard protocol without IV contrast. RADIATION DOSE REDUCTION: This exam was performed according to the departmental dose-optimization program which includes automated exposure control, adjustment of the mA and/or kV according to patient size and/or use of iterative reconstruction technique. COMPARISON:  01/27/2022. FINDINGS: Lower chest: No acute findings.  Chronic interstitial thickening. Hepatobiliary: Several subcentimeter low-attenuation lesions, stable, consistent with cysts. Liver normal in size and overall attenuation. No other masses. Normal gallbladder. No bile duct dilation. Pancreas: No mass or inflammation. Spleen: Normal in size without focal abnormality. Adrenals/Urinary Tract: No adrenal masses. 1.9 cm exophytic low-attenuation mass, upper pole of the right kidney. 1.9 cm low-attenuation mass, midpole the right kidney, both stable and consistent with cysts. No collecting system stones. No hydronephrosis. Ureters normal in course and in caliber. Bladder is distended with  irregular wall thickening and cellule formation mostly along the superior aspect. Enlarged prostate bulges into the bladder base. These findings are stable. Stomach/Bowel: Normal stomach. Small bowel and colon are normal in caliber. No wall thickening. No inflammation. Mild generalized increase in the colonic stool burden. Vascular/Lymphatic: Aorta is tortuous with diffuse atherosclerosis. Is dilated to a maximum of 3.4 cm infrarenal, unchanged. No enlarged lymph nodes. Reproductive: Marked enlargement of prostate, 7.6 x 6.7 x 9.8 cm, unchanged. Other: No hernia.  No ascites. Musculoskeletal: Chronic bilateral pars defects at L5-S1 with a grade 1 to grade 2 anterolisthesis. No acute fracture. No bone lesion. Skeletal structures are demineralized. Advanced degenerative changes noted of the visualized spine. IMPRESSION: 1. No acute findings within the abdomen or pelvis. 2. Multiple stable chronic findings including marked prostate hypertrophy and bladder changes consistent with chronic bladder outlet obstruction. 3. Abdominal aortic aneurysm, 3.4 cm, stable from the recent prior CT. Recommend follow-up ultrasound every 3 years. This recommendation follows ACR consensus guidelines: White Paper of the ACR Incidental Findings Committee II on Vascular Findings. J Am Coll Radiol 2013; 10:789-794. 4. Mild generalized increase in the colonic stool burden. Electronically Signed   By: Amie Portland M.D.   On: 02/26/2022 15:38  ? ?DG Chest 2 View ? ?Result Date: 02/26/2022 ?CLINICAL DATA:  Trauma, fall EXAM: CHEST - 2 VIEW COMPARISON:  Previous studies including the examination done on 12/13/2015 FINDINGS: Transverse diameter of heart is slightly increased. Central pulmonary vessels are prominent. There are no signs of alveolar pulmonary edema. Left hemidiaphragm is elevated. There is crowding of markings in the left lower lung fields. There is blunting of left lateral CP angle. IMPRESSION: Linear densities in the left lower  lung fields may suggest crowding of markings due to poor inspiration or subsegmental atelectasis. Blunting of left lateral CP angle may be due to small effusion or pleural thickening. Electronically Signed   By: Ernie Avena M.D.   On: 02/26/2022 13:18  ? ?CT HEAD WO CONTRAST ? ?Result Date: 02/26/2022 ?CLINICAL DATA:  Altered mental status EXAM: CT HEAD WITHOUT CONTRAST TECHNIQUE: Contiguous axial images were obtained from the base of the skull through the vertex without intravenous contrast. RADIATION DOSE REDUCTION: This exam was performed according to the departmental dose-optimization program which includes automated exposure control, adjustment of the mA and/or kV according to patient size and/or use of iterative reconstruction technique. COMPARISON:  06/02/2019 FINDINGS: There is patient motion in many of the images limiting the study. Brain: No acute intracranial findings are seen. There are no signs of intracranial bleeding. Cortical sulci are prominent. There is  decreased density in periventricular and subcortical white matter. Vascular: Unremarkable. Skull: No displaced fractures are seen. Sinuses/Orbits: Unremarkable. Other: None IMPRESSION: Motion limited study. As far as seen, no acute intracranial findings are noted. Atrophy. Small-vessel disease. Electronically Signed   By: Elmer Picker M.D.   On: 02/26/2022 15:35  ? ?MR BRAIN WO CONTRAST ? ?Result Date: 02/28/2022 ?CLINICAL DATA:  Mental status change. EXAM: MRI HEAD WITHOUT CONTRAST TECHNIQUE: Multiplanar, multiecho pulse sequences of the brain and surrounding structures were obtained without intravenous contrast. COMPARISON:  None Available. FINDINGS: Brain: No acute infarct, mass effect or extra-axial collection. Chronic Microhemorrhage in the left frontal operculum. There is multifocal hyperintense T2-weighted signal within the white matter. Generalized cerebral volume loss. The midline structures are normal. Vascular: Major flow  voids are preserved. Skull and upper cervical spine: Normal calvarium and skull base. Visualized upper cervical spine and soft tissues are normal. Sinuses/Orbits:No paranasal sinus fluid levels or advanced

## 2022-03-01 NOTE — Progress Notes (Signed)
Initial Nutrition Assessment ? ?DOCUMENTATION CODES:  ? ?Severe malnutrition in context of chronic illness ? ?INTERVENTION:  ? ?Tube Feeding via Cortrak:  ?Start Jevity 1.5 @ 25 mL/hr and advance by 10 mL q8h until goal rate of 55 mL/hr is reached (1320 mL/day) ?45 mL ProSource TF - BID ?150 mL free water flush q4h  ?Provides 2060 kcal, 106 gm of protein, and 1003 mL free water (1903 mL total free water) daily.  ?Monitor magnesium, potassium, and phosphorus BID for at least 3 days, MD to replete as needed, as pt is at risk for refeeding syndrome given malnutrition. ? ?NUTRITION DIAGNOSIS:  ? ?Severe Malnutrition related to chronic illness (COPD) as evidenced by severe muscle depletion, severe fat depletion. ? ?GOAL:  ? ?Patient will meet greater than or equal to 90% of their needs ? ?MONITOR:  ? ?Diet advancement, Labs, Weight trends, TF tolerance ? ?REASON FOR ASSESSMENT:  ? ?Consult ?Enteral/tube feeding initiation and management ? ?ASSESSMENT:  ? ?88 y.o. male presented to the ED with AMS. Pt was seen in the ED on 5/4 for orchitis and epididymitis. PMH includes COPD and HTN. Pt admitted with acute encephalopathy and abdominal pain.  ? ?5/15 - NPO ?5/17 - Cortrak placed (tip stomach) ? ?Pt resting in bed, does not acknowledge this RD. Spoke with pt son at bedside. ? ?Son states that pt lived alone and that he had a good appetite PTA.  Reports that he was able to fix all his own meals; typically was easy to fix meals. Denies any use of ONS.  ? ?Son unsure of pt UBW, endorses some weight loss, but not significant. States that his father always tried to stay skinny due to ankle pain if he weighed too much. Per EMR, pt has had a 10.4% weight loss within 6 months, this is clinically significant for time frame.  ? ?Son is aware of Cortrak placement and agreeable. Son reports that pt needs nutrition and vitamins.  ? ?Medications reviewed and include: IV antibiotics, IV thiamine  ?Labs reviewed: BUN 27  ? ?NUTRITION -  FOCUSED PHYSICAL EXAM: ? ?Flowsheet Row Most Recent Value  ?Orbital Region Severe depletion  ?Upper Arm Region Severe depletion  ?Thoracic and Lumbar Region Moderate depletion  ?Buccal Region Severe depletion  ?Temple Region Severe depletion  ?Clavicle Bone Region Severe depletion  ?Clavicle and Acromion Bone Region Severe depletion  ?Scapular Bone Region Severe depletion  ?Dorsal Hand Severe depletion  ?Patellar Region Severe depletion  ?Anterior Thigh Region Severe depletion  ?Posterior Calf Region Severe depletion  ?Edema (RD Assessment) None  ?Hair Reviewed  ?Eyes Reviewed  ?Mouth Reviewed  ?Skin Reviewed  ?Nails Reviewed  ? ?Diet Order:   ?Diet Order   ? ?       ?  Diet NPO time specified Except for: Sips with Meds  Diet effective now       ?  ? ?  ?  ? ?  ? ? ?EDUCATION NEEDS:  ? ?Not appropriate for education at this time ? ?Skin:  Skin Assessment: Reviewed RN Assessment ? ?Last BM:  No Documentation ? ?Height:  ? ?Ht Readings from Last 1 Encounters:  ?02/26/22 5\' 5"  (1.651 m)  ? ? ?Weight:  ? ?Wt Readings from Last 1 Encounters:  ?02/26/22 62.2 kg  ? ? ?Ideal Body Weight:  61.8 kg ? ?BMI:  Body mass index is 22.82 kg/m?. ? ?Estimated Nutritional Needs:  ? ?Kcal:  1900-2100 ? ?Protein:  95-110 grams ? ?Fluid:  >/= 1.9  L ? ? ?Hermina Barters RD, LDN ?Clinical Dietitian ?See AMiON for contact information.  ?

## 2022-03-01 NOTE — Progress Notes (Signed)
?   ? ? ? ? ?[facility] ? ?Date of Admission:  02/26/2022    ? ?Abx: ?5/16-c doxy ?5/15-c ceftriaxone ?  ?  ?5/14 vanc ?5/14 cefepime                                                  ?  ?Outpatient bactrim 5/04-16; levoflox 4/14-5/16 ?  ?  ?Assessment: ?88 yo male with bph/boo on tamsulosin/finasteride, recurrent epididymitis here for same complicated by ams and leukopenia/thrombocytopenia ?  ?He has been followed by urology and has received 2 courses abx, initially with 3 weeks moxifloxacin and then bactrim. He had near resolution of sx each time and was able to function again but recurrent symptoms of severe testicular pain/swelling and this admission also metabolic encephalopathy ?  ?He was seen by urology 5/15 who performed right sided hydrocele aspiration. There was bilateral scrotal swelling and mild tenderness on their exam. He has no erythema there. A foley was placed due to bph/urinary retention. At this time they do not detect any abscess ?  ?He also has relative leukopenia and thrombocytopenia thought to be sepsis/dic related. I also consider bactrim side effect. onc evaluated ?  ?  ?  ?  ?As of today 5/16 he remains rather stable sick since admission on 5/14. I suspect his prostate enlargement a contributing factor to relapse. I also consider rare infection due to mycoplasma which could be fairly resistant. As susceptibility is difficult to achieve, doxycycline with potential repeat quinolone course wouldn't be unreasonable ?  ?An mri is suggested to look for gu abscess ?  ?I am concerned though he had received rather a long course with appropriate empiric abx (1 month levo). Urology is considering prostate embolization to help with obstruction, which is not unreasonable if current treatment is without help ? ?----------------- ?5/17 assessment ?Mri on 5/16 of pelvis doesn't suggest any obvious abscess/orchitis/epididymitis. Clinically acting like orchitis/epididymitis.  ?More  calm today ?Hydrocele cx/ucx had remained negative. No sign of sepsis otherwise ?Initial cytopenias ?bactrim related.  ? ?I do not think this is an exotic process of infection such as mump/brucella.  ? ?Would finish course of current abx tx as below   ?  ?Plan: ?Follow up final culture of hydrocele fluid ?Finish 3 more days ctrx 5/20 and 6 more days doxy on 5/22 ?Discussed with primary team ? ? ?I spent more than 35 minute reviewing data/chart, and coordinating care and >50% direct face to face time providing counseling/discussing diagnostics/treatment plan with patient ? ? ?Principal Problem: ?  Acute encephalopathy ?Active Problems: ?  Retention of urine ?  Thrombocytopenia (HCC) ?  Abdominal pain ?  Bilateral calf pain ?  Orchitis, epididymitis, and epididymo-orchitis ? ? ?Allergies  ?Allergen Reactions  ? Penicillins Anaphylaxis  ? ? ?Scheduled Meds: ? Chlorhexidine Gluconate Cloth  6 each Topical Daily  ? finasteride  5 mg Oral Daily  ? tamsulosin  0.4 mg Oral Daily  ? ?Continuous Infusions: ? sodium chloride 100 mL/hr at 03/01/22 0626  ? cefTRIAXone (ROCEPHIN)  IV 2 g (02/28/22 1253)  ? doxycycline (VIBRAMYCIN) IV 100 mg (03/01/22 0325)  ? thiamine injection    ? ?PRN Meds:.albuterol, LORazepam, ondansetron **OR** ondansetron (ZOFRAN) IV ? ? ?SUBJECTIVE: ?More calm, but mentating still below baseline ?Improving cytopenia ?Cx ngtd ?Afebrile ?No n/v/diarrhea/rash ? ? ?Review  of Systems: ?ROS ?All other ROS was negative, except mentioned above ? ? ? ? ?OBJECTIVE: ?Vitals:  ? 03/01/22 0600 03/01/22 0753 03/01/22 0912 03/01/22 1100  ?BP:  126/76    ?Pulse: 60 71    ?Resp:  18    ?Temp:  98.3 ?F (36.8 ?C)    ?TempSrc:  Axillary    ?SpO2: 95% 95% 94% 95%  ?Weight:      ?Height:      ? ?Body mass index is 22.82 kg/m?. ? ?Physical Exam ?General/constitutional: no distress; eyes closed, somnolent, no interaction ?HEENT: Normocephalic ?CV: rrr no mrg ?Lungs: clear to auscultation, normal respiratory effort ?Abd: Soft,  Nontender ?Ext: no edema ?Skin: No Rash ?GU: scrotal area swollen but per primary team appears to improve -- no redness ? ? ?Lab Results ?Lab Results  ?Component Value Date  ? WBC 7.6 03/01/2022  ? HGB 13.0 03/01/2022  ? HCT 40.2 03/01/2022  ? MCV 88.5 03/01/2022  ? PLT 74 (L) 03/01/2022  ? PLT 73 (L) 03/01/2022  ?  ?Lab Results  ?Component Value Date  ? CREATININE 0.98 03/01/2022  ? BUN 27 (H) 03/01/2022  ? NA 143 03/01/2022  ? K 4.1 03/01/2022  ? CL 116 (H) 03/01/2022  ? CO2 21 (L) 03/01/2022  ?  ?Lab Results  ?Component Value Date  ? ALT 25 03/01/2022  ? AST 40 03/01/2022  ? ALKPHOS 47 03/01/2022  ? BILITOT 0.2 (L) 03/01/2022  ?  ? ? ?Microbiology: ?Recent Results (from the past 240 hour(s))  ?Urine Culture     Status: None  ? Collection Time: 02/26/22  2:01 PM  ? Specimen: In/Out Cath Urine  ?Result Value Ref Range Status  ? Specimen Description   Final  ?  IN/OUT CATH URINE ?Performed at [facility], [address] [place], [place] [address] ?  ? Special Requests   Final  ?  NONE ?Performed at [facility], [address] [place], [place] [address] ?  ? Culture   Final  ?  NO GROWTH ?Performed at [facility], [place] [address], [place], [place] [address] ?  ? Report Status 02/28/2022 FINAL  Final  ?Culture, blood (Routine X 2) w Reflex to ID Panel     Status: None (Preliminary result)  ? Collection Time: 02/26/22  4:05 PM  ? Specimen: BLOOD LEFT FOREARM  ?Result Value Ref Range Status  ? Specimen Description   Final  ?  BLOOD LEFT FOREARM ?Performed at [facility], [address], [place], [place] [address] ?  ? Special Requests   Final  ?  BOTTLES DRAWN AEROBIC AND ANAEROBIC Blood Culture adequate volume ?Performed at [facility], [address] [place], [place] [address] ?  ? Culture   Final  ?  NO GROWTH 3 DAYS ?Performed at [facility], [place] [address], [place], [place] [address] ?  ? Report Status PENDING   Incomplete  ?Culture, blood (Routine X 2) w Reflex to ID Panel     Status: None (Preliminary result)  ? Collection Time: 02/26/22  4:20 PM  ? Specimen: BLOOD LEFT WRIST  ?Result Value Ref Range Status  ? Specimen Description   Final  ?  BLOOD LEFT WRIST ?Performed at [facility], [address], [place], [place] [address] ?  ? Special Requests   Final  ?  BOTTLES DRAWN AEROBIC AND ANAEROBIC Blood Culture results may not be optimal due to an inadequate volume of blood received  in culture bottles ?Performed at [facility], [address], [place], [place] [address] ?  ? Culture   Final  ?  NO GROWTH 3 DAYS ?Performed at [facility], [place] [address], [place], [place] [address] ?  ? Report Status PENDING  Incomplete  ?Resp Panel by RT-PCR (Flu A&B, Covid) Nasopharyngeal Swab     Status: None  ? Collection Time: 02/27/22  1:01 AM  ? Specimen: Nasopharyngeal Swab; Nasopharyngeal(NP) swabs in vial transport medium  ?Result Value Ref Range Status  ? SARS Coronavirus 2 by RT PCR NEGATIVE NEGATIVE Final  ?  Comment: (NOTE) ?SARS-CoV-2 target nucleic acids are NOT DETECTED. ? ?The SARS-CoV-2 RNA is generally detectable in upper respiratory ?specimens during the acute phase of infection. The lowest ?concentration of SARS-CoV-2 viral copies this assay can detect is ?138 copies/mL. A negative result does not preclude SARS-Cov-2 ?infection and should not be used as the sole basis for treatment or ?other patient management decisions. A negative result may occur with  ?improper specimen collection/handling, submission of specimen other ?than nasopharyngeal swab, presence of viral mutation(s) within the ?areas targeted by this assay, and inadequate number of viral ?copies(<138 copies/mL). A negative result must be combined with ?clinical observations, patient history, and epidemiological ?information. The expected result is Negative. ? ?Fact Sheet for Patients:   ?EntrepreneurPulse.com.au ? ?Fact Sheet for Healthcare Providers:  ?IncredibleEmployment.be ? ?This test is no t yet approved or cleared by the [place] and  ?has been authorized for [employer]

## 2022-03-01 NOTE — Consult Note (Signed)
Consultation Note Date: 03/01/2022   Patient Name: Kenneth Woods  DOB: 1934-05-22  MRN: [id number]  Age / Sex: 88 y.o., male  PCP: Susy Frizzle, MD Referring Physician: Jonetta Osgood, MD  Reason for Consultation: Establishing goals of care  HPI/Patient Profile: 88 y.o. male  with past medical history of BPH, HTN, and COPD admitted on 02/26/2022 with AMS. Pt has been battling with orchitis and epididymitis for past month. Most recently pt seen in ED on 5/4.  Epididymitis and orchitis was worsening as demonstrated on [place] then despite ABx treatment as outpt. This admission, patient's acute encephalopathy continues with unclear etiology. Neuroimaging negative for acute abnormalities and EEG negative for seizures. Poor PO intake d/t mental status - pursuing Cortrak placement 5/17.  Also with concern about DIC.  Patient continues on antibiotics given concern for possible infectious etiology.  MRI pelvis negative for any acute process.  PMT consulted to discuss goals of care.  Clinical Assessment and Goals of Care: I have reviewed medical records including EPIC notes, labs and imaging, received report from Dr. Sloan Leiter, assessed the patient and then met with patient's son [name] to discuss diagnosis prognosis, [place], EOL wishes, disposition and options.  I attempted to speak with patient and he does wake to my voice and tells me "I am all right" but no other verbalization.  Fluctuates between lethargic and restless.  I introduced Palliative Medicine as specialized medical care for people living with serious illness. It focuses on providing relief from the symptoms and stress of a serious illness. The goal is to improve quality of life for both the patient and the family.  We discussed a brief life review of the patient.  [name] tells me patient has 4 children however his 2 daughters are not really involved in medical decision making.  [name] lives next to  patient and assists him as needed, patient's other son is involved but is a Administrator so availability is limited.  As far as functional and nutritional status [name] tells me prior to current illness patient was living independently.  Patient was able to complete ADLs independently.  [name] tells me patient had a good appetite.  [name] does speak of some cognitive decline telling me patient often forgets what he did that morning.   We discussed patient's current illness and what it means in the larger context of patient's on-going co-morbidities.   [name] is a very focused on patient's improvement; it is his opinion that patient is significantly better today than prior days.  He also continuously brings up patient's need for vitamins and tells me he thinks once patient sees more vitamins he will feel better.  We discussed planning for artificial feeding and that patient can receive nutrition that way, son is interested in pursuing artificial feeding.  The difference between aggressive medical intervention and comfort care was considered in light of the patient's goals of care.   Encouraged family to consider DNR/DNI status understanding evidenced based poor outcomes in similar hospitalized patients, as the cause of the arrest is likely associated with chronic/terminal disease rather than a reversible acute cardio-pulmonary event.  Son seems to agree that DNR is likely appropriate but tells me he wants to talk to other family members and is really just hopeful that patient continues to improve.  We discussed that the medical team is hopeful for improvement as well but we do need to discuss other medical decisions "just in case".  Discussed with family the importance of continued conversation  with family and the medical providers regarding overall plan of care and treatment options, ensuring decisions are within the context of the patients values and GOCs.    We briefly discussed plan of care once  patient discharges -son tells me there are family members that can stay at home with patient so they are hopeful for discharge home with home health.  Questions and concerns were addressed. The family was encouraged to call with questions or concerns.    Primary Decision Maker NEXT OF KIN -39 of adult children    SUMMARY OF RECOMMENDATIONS   -Son interested in continuing current course and allowing time for outcomes, son feels patient has significantly improved today, son interested in pursuing artificial nutrition -DNR recommended, son would like to speak to other family members -Son expresses hope for discharge home with home health, there are family members that can stay in the home  Code Status/Advance Care Planning: Full code      Primary Diagnoses: Present on Admission:  Acute encephalopathy  Thrombocytopenia ([place])  Abdominal pain  Bilateral calf pain  Retention of urine  Orchitis, epididymitis, and epididymo-orchitis   I have reviewed the medical record, interviewed the patient and family, and examined the patient. The following aspects are pertinent.  Past Medical History:  Diagnosis Date   Arthritis    Back pain, chronic    BPH (benign prostatic hypertrophy) with urinary obstruction    COPD (chronic obstructive pulmonary disease) (HCC)    Elevated PSA 10/16/2010   H/O prostate biopsy    Hemorrhoids, external    Hyperlipidemia    Hypertension    Social History   Socioeconomic History   Marital status: Widowed    Spouse name: Not on file   Number of children: Not on file   Years of education: Not on file   Highest education level: Not on file  Occupational History   Not on file  Tobacco Use   Smoking status: Former    Packs/day: 1.00    Years: 30.00    Pack years: 30.00    Types: Cigarettes    Quit date: 06/09/2005    Years since quitting: 16.7   Smokeless tobacco: Never  Vaping Use   Vaping Use: Never used  Substance and Sexual Activity    Alcohol use: Yes    Comment: OCCASIONAL   Drug use: No   Sexual activity: Not Currently  Other Topics Concern   Not on file  Social History Narrative   Not on file   Social Determinants of Health   Financial Resource Strain: Not on file  Food Insecurity: Not on file  Transportation Needs: Not on file  Physical Activity: Not on file  Stress: Not on file  Social Connections: Not on file   Family History  Problem Relation Age of Onset   CVA Mother    Cancer Father        brain tumor   Scheduled Meds:  Chlorhexidine Gluconate Cloth  6 each Topical Daily   finasteride  5 mg Oral Daily   tamsulosin  0.4 mg Oral Daily   Continuous Infusions:  sodium chloride 100 mL/hr at 03/01/22 0634   cefTRIAXone (ROCEPHIN)  IV 2 g (03/01/22 1200)   doxycycline (VIBRAMYCIN) IV 100 mg (03/01/22 0325)   thiamine injection     PRN Meds:.albuterol, LORazepam, ondansetron **OR** ondansetron (ZOFRAN) IV Allergies  Allergen Reactions   Penicillins Anaphylaxis   Review of Systems  Unable to perform ROS: Mental status change   Physical  Exam Constitutional:      General: He is not in acute distress.    Appearance: He is ill-appearing.     Comments: Lethargic, responds to loud voice - restless when awake  Cardiovascular:     Rate and Rhythm: Normal rate. Rhythm irregular.  Pulmonary:     Effort: Pulmonary effort is normal.     Comments: Remains on nasal cannula Skin:    General: Skin is warm and dry.  Neurological:     Mental Status: He is disoriented.  Psychiatric:        Cognition and Memory: Cognition is impaired. Memory is impaired.    Vital Signs: BP 128/69 (BP Location: Left Arm)   Pulse 64   Temp 98 F (36.7 C) (Axillary)   Resp 18   Ht '5\' 5"'  (1.651 m)   Wt 62.2 kg   SpO2 93%   BMI 22.82 kg/m  Pain Scale: Faces POSS *See Group Information*: S-Acceptable,Sleep, easy to arouse Pain Score: 0-No pain   SpO2: SpO2: 93 % O2 Device:SpO2: 93 % O2 Flow Rate: .O2 Flow Rate  (L/min): 1 L/min  IO: Intake/output summary:  Intake/Output Summary (Last 24 hours) at 03/01/2022 1230 Last data filed at 03/01/2022 0440 Gross per 24 hour  Intake 250 ml  Output 851 ml  Net -601 ml    LBM:   Baseline Weight: Weight: 68 kg Most recent weight: Weight: 62.2 kg     Palliative Assessment/Data: PPS 20%    *Please note that this is a verbal dictation therefore any spelling or grammatical errors are due to the "[place]" system interpretation.  Juel Burrow, DNP, AGNP-C Palliative Medicine Team [phone] Pager: [phone]

## 2022-03-01 NOTE — Procedures (Signed)
Cortrak ? ?Tube Type:  Cortrak - 43 inches ?Tube Location:  Right nare ?Initial Placement:  Stomach ?Secured by: Bridle ?Technique Used to Measure Tube Placement:  Marking at nare/corner of mouth ?Cortrak Secured At:  70 cm ? ?Cortrak Tube Team Note: ? ?Consult received to place a Cortrak feeding tube.  ? ?X-ray is required, abdominal x-ray has been ordered by the Cortrak team. Please confirm tube placement before using the Cortrak tube.  ? ?If the tube becomes dislodged please keep the tube and contact the Cortrak team at [email or web address] (password [email or web address]) for replacement.  ?If after hours and replacement cannot be delayed, place a NG tube and confirm placement with an abdominal x-ray.  ? ? ?Jaydi Bray MS, RD, LDN ?Please refer to AMION for RD and/or RD on-call/weekend/after hours pager ? ? ?

## 2022-03-02 DIAGNOSIS — Z7189 Other specified counseling: Secondary | ICD-10-CM | POA: Diagnosis not present

## 2022-03-02 DIAGNOSIS — N452 Orchitis: Secondary | ICD-10-CM | POA: Diagnosis not present

## 2022-03-02 DIAGNOSIS — Z515 Encounter for palliative care: Secondary | ICD-10-CM | POA: Diagnosis not present

## 2022-03-02 DIAGNOSIS — G9340 Encephalopathy, unspecified: Secondary | ICD-10-CM | POA: Diagnosis not present

## 2022-03-02 DIAGNOSIS — N453 Epididymo-orchitis: Secondary | ICD-10-CM | POA: Diagnosis not present

## 2022-03-02 DIAGNOSIS — M79661 Pain in right lower leg: Secondary | ICD-10-CM | POA: Diagnosis not present

## 2022-03-02 DIAGNOSIS — N179 Acute kidney failure, unspecified: Secondary | ICD-10-CM | POA: Diagnosis not present

## 2022-03-02 LAB — MAGNESIUM
Magnesium: 2.1 mg/dL (ref 1.7–2.4)
Magnesium: 2 mg/dL (ref 1.7–2.4)

## 2022-03-02 LAB — COMPREHENSIVE METABOLIC PANEL
ALT: 24 U/L (ref 0–44)
AST: 39 U/L (ref 15–41)
Albumin: 2 g/dL — ABNORMAL LOW (ref 3.5–5.0)
Alkaline Phosphatase: 44 U/L (ref 38–126)
Anion gap: 3 — ABNORMAL LOW (ref 5–15)
BUN: 24 mg/dL — ABNORMAL HIGH (ref 8–23)
CO2: 25 mmol/L (ref 22–32)
Calcium: 8.6 mg/dL — ABNORMAL LOW (ref 8.9–10.3)
Chloride: 114 mmol/L — ABNORMAL HIGH (ref 98–111)
Creatinine, Ser: 0.81 mg/dL (ref 0.61–1.24)
GFR, Estimated: >60 mL/min (ref >60)
Glucose, Bld: 119 mg/dL — ABNORMAL HIGH (ref 70–99)
Potassium: 3.9 mmol/L (ref 3.5–5.1)
Sodium: 142 mmol/L (ref 135–145)
Total Bilirubin: 0.6 mg/dL (ref 0.3–1.2)
Total Protein: 4.6 g/dL — ABNORMAL LOW (ref 6.5–8.1)

## 2022-03-02 LAB — DIFFERENTIAL
Abs Immature Granulocytes: 0.08 10*3/uL — ABNORMAL HIGH (ref 0.00–0.07)
Basophils Absolute: 0.1 10*3/uL (ref 0.0–0.1)
Basophils Relative: 1 %
Eosinophils Absolute: 0 10*3/uL (ref 0.0–0.5)
Eosinophils Relative: 0 %
Immature Granulocytes: 1 %
Lymphocytes Relative: 83 %
Lymphs Abs: 10.6 10*3/uL — ABNORMAL HIGH (ref 0.7–4.0)
Monocytes Absolute: 0.5 10*3/uL (ref 0.1–1.0)
Monocytes Relative: 4 %
Neutro Abs: 1.4 10*3/uL — ABNORMAL LOW (ref 1.7–7.7)
Neutrophils Relative %: 11 %

## 2022-03-02 LAB — CBC
HCT: 38.2 % — ABNORMAL LOW (ref 39.0–52.0)
Hemoglobin: 12.5 g/dL — ABNORMAL LOW (ref 13.0–17.0)
MCH: 28.5 pg (ref 26.0–34.0)
MCHC: 32.7 g/dL (ref 30.0–36.0)
MCV: 87 fL (ref 80.0–100.0)
Platelets: 104 10*3/uL — ABNORMAL LOW (ref 150–400)
RBC: 4.39 MIL/uL (ref 4.22–5.81)
RDW: 14.9 % (ref 11.5–15.5)
WBC: 13.3 10*3/uL — ABNORMAL HIGH (ref 4.0–10.5)
nRBC: 0 % (ref 0.0–0.2)

## 2022-03-02 LAB — BODY FLUID CULTURE W GRAM STAIN
Culture: NO GROWTH
Gram Stain: NONE SEEN

## 2022-03-02 LAB — GLUCOSE, CAPILLARY
Glucose-Capillary: 108 mg/dL — ABNORMAL HIGH (ref 70–99)
Glucose-Capillary: 110 mg/dL — ABNORMAL HIGH (ref 70–99)
Glucose-Capillary: 117 mg/dL — ABNORMAL HIGH (ref 70–99)
Glucose-Capillary: 157 mg/dL — ABNORMAL HIGH (ref 70–99)
Glucose-Capillary: 161 mg/dL — ABNORMAL HIGH (ref 70–99)
Glucose-Capillary: 163 mg/dL — ABNORMAL HIGH (ref 70–99)
Glucose-Capillary: 187 mg/dL — ABNORMAL HIGH (ref 70–99)

## 2022-03-02 LAB — PHOSPHORUS
Phosphorus: 1.9 mg/dL — ABNORMAL LOW (ref 2.5–4.6)
Phosphorus: 2.9 mg/dL (ref 2.5–4.6)

## 2022-03-02 LAB — VITAMIN B1: Vitamin B1 (Thiamine): 81 nmol/L (ref 66.5–200.0)

## 2022-03-02 MED ORDER — POTASSIUM PHOSPHATES 15 MMOLE/5ML IV SOLN
30.0000 mmol | Freq: Once | INTRAVENOUS | Status: AC
  Administered 2022-03-02: 30 mmol via INTRAVENOUS
  Filled 2022-03-02: qty 10

## 2022-03-02 NOTE — Progress Notes (Addendum)
HEMATOLOGY-ONCOLOGY PROGRESS NOTE  ASSESSMENT AND PLAN: 1.  Thrombocytopenia 2.  Mild leukopenia 3.  Acute encephalopathy 4.  Recurrent right epididymitis/hydrocele 5.  Possible fluid collection at the bulbar urethra 6.  AKI 7.  Acute urinary retention 8.  BPH 9.  Hypertension 10.  3.4 cm abdominal aortic aneurysm 11.  COPD 8.  DIC, resolved  Kenneth Woods is more alert today.  He is able to answer some questions.  His platelet count continues to improve and is up to 104,000 today.  The lymphocytes are mildly elevated.  Cultures have remained negative.  No clear source of infection has been identified.  Recommendations: 1.  Continue work-up and antibiotics per infectious disease. 2.  Continue to monitor CBC to ensure platelet recovery.  Check WBC differential 3.  Mental status evaluation per the medical service and neurology  Hematology will sign off at this time.  Please reconsult if questions arise.  Kenneth Bussing, DNP, AGPCNP-BC, AOCNP  Kenneth Woods was interviewed and examined.  He is more alert today.  He had less tenderness in the left lower extremity.  The platelet count appears to be recovering.  I suspect he is recovering from a systemic infection that has not been identified.  We will sign off of the case.  Please call us as needed.  I was present for greater than 50% of today's visit.  I performed medical decision making.  Julieanne Manson, MD  SUBJECTIVE: More alert today.  Speaking little bit more today.  No new issues.   PHYSICAL EXAMINATION:  Vitals:   03/02/22 0309 03/02/22 0751  BP: (!) 139/91 136/60  Pulse: 70 63  Resp: 19 18  Temp: 98 F (36.7 C) 97.8 F (36.6 C)  SpO2: 93% 93%   Filed Weights   02/26/22 1520 02/26/22 2208  Weight: 68 kg 62.2 kg    Intake/Output from previous day: 05/17 0701 - 05/18 0700 In: 5953.1 [I.V.:5803.1; IV Piggyback:150] Out: [id number] [Urine:1650]  Physical exam:  General-more alert, able to answer some  questions HEENT-the mouth is dry, no bleeding Abdomen-soft, nontender Vascular-no leg edema, thickened areas at the medial aspect of the left lower leg GU: Enlargement of the right scrotum with tenderness Skin-scattered ecchymoses  LABORATORY DATA:  I have reviewed the data as listed    Latest Ref Rng & Units 03/02/2022    5:17 AM 03/01/2022    1:25 AM 02/28/2022    1:20 AM  CMP  Glucose 70 - 99 mg/dL 119   78   90    BUN 8 - 23 mg/dL 24   27   28     Creatinine 0.61 - 1.24 mg/dL 0.81   0.98   1.04    Sodium 135 - 145 mmol/L 142   143   138    Potassium 3.5 - 5.1 mmol/L 3.9   4.1   3.9    Chloride 98 - 111 mmol/L 114   116   111    CO2 22 - 32 mmol/L 25   21   21     Calcium 8.9 - 10.3 mg/dL 8.6   8.8   8.3    Total Protein 6.5 - 8.1 g/dL 4.6   4.7   4.5    Total Bilirubin 0.3 - 1.2 mg/dL 0.6   0.2   0.3    Alkaline Phos 38 - 126 U/L 44   47   47    AST 15 - 41 U/L 39   40   37  ALT 0 - 44 U/L 24   25   26       Lab Results  Component Value Date   WBC 7.6 03/01/2022   HGB 13.0 03/01/2022   HCT 40.2 03/01/2022   MCV 88.5 03/01/2022   PLT 74 (L) 03/01/2022   PLT 73 (L) 03/01/2022   NEUTROABS 1.4 (L) 03/01/2022    No results found for: CEA1, CEA, CAN199, CA125, PSA1  CT Abdomen Pelvis Wo Contrast  Result Date: 02/26/2022 CLINICAL DATA:  Generalized abdominal pain. EXAM: CT ABDOMEN AND PELVIS WITHOUT CONTRAST TECHNIQUE: Multidetector CT imaging of the abdomen and pelvis was performed following the standard protocol without IV contrast. RADIATION DOSE REDUCTION: This exam was performed according to the departmental dose-optimization program which includes automated exposure control, adjustment of the mA and/or kV according to patient size and/or use of iterative reconstruction technique. COMPARISON:  01/27/2022. FINDINGS: Lower chest: No acute findings.  Chronic interstitial thickening. Hepatobiliary: Several subcentimeter low-attenuation lesions, stable, consistent with cysts.  Liver normal in size and overall attenuation. No other masses. Normal gallbladder. No bile duct dilation. Pancreas: No mass or inflammation. Spleen: Normal in size without focal abnormality. Adrenals/Urinary Tract: No adrenal masses. 1.9 cm exophytic low-attenuation mass, upper pole of the right kidney. 1.9 cm low-attenuation mass, midpole the right kidney, both stable and consistent with cysts. No collecting system stones. No hydronephrosis. Ureters normal in course and in caliber. Bladder is distended with irregular wall thickening and cellule formation mostly along the superior aspect. Enlarged prostate bulges into the bladder base. These findings are stable. Stomach/Bowel: Normal stomach. Small bowel and colon are normal in caliber. No wall thickening. No inflammation. Mild generalized increase in the colonic stool burden. Vascular/Lymphatic: Aorta is tortuous with diffuse atherosclerosis. Is dilated to a maximum of 3.4 cm infrarenal, unchanged. No enlarged lymph nodes. Reproductive: Marked enlargement of prostate, 7.6 x 6.7 x 9.8 cm, unchanged. Other: No hernia.  No ascites. Musculoskeletal: Chronic bilateral pars defects at L5-S1 with a grade 1 to grade 2 anterolisthesis. No acute fracture. No bone lesion. Skeletal structures are demineralized. Advanced degenerative changes noted of the visualized spine. IMPRESSION: 1. No acute findings within the abdomen or pelvis. 2. Multiple stable chronic findings including marked prostate hypertrophy and bladder changes consistent with chronic bladder outlet obstruction. 3. Abdominal aortic aneurysm, 3.4 cm, stable from the recent prior CT. Recommend follow-up ultrasound every 3 years. This recommendation follows ACR consensus guidelines: White Paper of the ACR Incidental Findings Committee II on Vascular Findings. J Am Coll Radiol 2013; 10:789-794. 4. Mild generalized increase in the colonic stool burden. Electronically Signed   By: Lajean Manes M.D.   On: 02/26/2022  15:38   DG Chest 2 View  Result Date: 02/26/2022 CLINICAL DATA:  Trauma, fall EXAM: CHEST - 2 VIEW COMPARISON:  Previous studies including the examination done on 12/13/2015 FINDINGS: Transverse diameter of heart is slightly increased. Central pulmonary vessels are prominent. There are no signs of alveolar pulmonary edema. Left hemidiaphragm is elevated. There is crowding of markings in the left lower lung fields. There is blunting of left lateral CP angle. IMPRESSION: Linear densities in the left lower lung fields may suggest crowding of markings due to poor inspiration or subsegmental atelectasis. Blunting of left lateral CP angle may be due to small effusion or pleural thickening. Electronically Signed   By: Elmer Picker M.D.   On: 02/26/2022 13:18   CT HEAD WO CONTRAST  Result Date: 02/26/2022 CLINICAL DATA:  Altered mental status EXAM: CT  HEAD WITHOUT CONTRAST TECHNIQUE: Contiguous axial images were obtained from the base of the skull through the vertex without intravenous contrast. RADIATION DOSE REDUCTION: This exam was performed according to the departmental dose-optimization program which includes automated exposure control, adjustment of the mA and/or kV according to patient size and/or use of iterative reconstruction technique. COMPARISON:  06/02/2019 FINDINGS: There is patient motion in many of the images limiting the study. Brain: No acute intracranial findings are seen. There are no signs of intracranial bleeding. Cortical sulci are prominent. There is decreased density in periventricular and subcortical white matter. Vascular: Unremarkable. Skull: No displaced fractures are seen. Sinuses/Orbits: Unremarkable. Other: None IMPRESSION: Motion limited study. As far as seen, no acute intracranial findings are noted. Atrophy. Small-vessel disease. Electronically Signed   By: Elmer Picker M.D.   On: 02/26/2022 15:35   MR BRAIN WO CONTRAST  Result Date: 02/28/2022 CLINICAL DATA:   Mental status change. EXAM: MRI HEAD WITHOUT CONTRAST TECHNIQUE: Multiplanar, multiecho pulse sequences of the brain and surrounding structures were obtained without intravenous contrast. COMPARISON:  None Available. FINDINGS: Brain: No acute infarct, mass effect or extra-axial collection. Chronic Microhemorrhage in the left frontal operculum. There is multifocal hyperintense T2-weighted signal within the white matter. Generalized cerebral volume loss. The midline structures are normal. Vascular: Major flow voids are preserved. Skull and upper cervical spine: Normal calvarium and skull base. Visualized upper cervical spine and soft tissues are normal. Sinuses/Orbits:No paranasal sinus fluid levels or advanced mucosal thickening. No mastoid or middle ear effusion. Normal orbits. IMPRESSION: 1. No acute intracranial abnormality. 2. Generalized cerebral volume loss and chronic small vessel disease. Electronically Signed   By: Ulyses Jarred M.D.   On: 02/28/2022 22:30   MR PELVIS W WO CONTRAST  Result Date: 02/28/2022 CLINICAL DATA:  Orchitis and epididymitis. Evaluate possible lesion at the base of the urethra on CT (per urology note). EXAM: MRI PELVIS WITHOUT AND WITH CONTRAST TECHNIQUE: Multiplanar multisequence MR imaging of the pelvis was performed both before and after administration of intravenous contrast. CONTRAST:  21mL GADAVIST GADOBUTROL 1 MMOL/ML IV SOLN COMPARISON:  Scrotal ultrasound dated 02/27/2022. CT abdomen/pelvis dated 02/26/2022. FINDINGS: Markedly limited evaluation due to patient motion. Urinary Tract: Thick-walled bladder, decompressed by an indwelling Foley catheter. Bowel:  Visualized bowel is grossly unremarkable. Vascular/Lymphatic: No evidence of aneurysm. No suspicious pelvic lymphadenopathy. Reproductive: Prostatomegaly, with marked enlargement of the central gland indenting the base of the bladder, reflecting BPH. Right scrotal hydrocele (series 9/image 18), poorly visualized. No  abnormality of the penile urethra. Specifically, no findings suspicious for periurethral abscess on MR. Other:  Small volume pelvic ascites. Musculoskeletal: Degenerative changes of the lumbar spine, with grade 1 anterolisthesis of L5 on S1. IMPRESSION: Markedly limited evaluation due to patient motion. No findings suspicious for periurethral abscess on MR. Right scrotal hydrocele, poorly visualized, better evaluated recent scrotal ultrasound. Marked BPH. Thick-walled bladder, decompressed by an indwelling Foley catheter. Electronically Signed   By: Julian Hy M.D.   On: 02/28/2022 21:43   DG Knee Complete 4 Views Left  Result Date: 02/26/2022 CLINICAL DATA:  Trauma, fall EXAM: LEFT KNEE - COMPLETE 4+ VIEW COMPARISON:  None FINDINGS: No recent fracture or dislocation is seen. There is 3 mm linear smooth marginated calcification anterior to the patella, possibly soft tissue calcification from previous injury. There is no significant effusion in the left knee. Tiny bony spurs seen in the patella and medial compartment. Arterial calcifications are seen in the soft tissues. IMPRESSION: No recent fracture or dislocation  is seen. Degenerative changes with minimal bony spurs are noted in the medial and patellofemoral compartments. Electronically Signed   By: Elmer Picker M.D.   On: 02/26/2022 13:20   DG Abd Portable 1V  Result Date: 03/01/2022 CLINICAL DATA:  Feeding tube placement. EXAM: PORTABLE ABDOMEN - 1 VIEW COMPARISON:  CT exam on 02/26/2022 FINDINGS: Interval placement of feeding tube, tip overlying the level of the distal stomach or proximal duodenum. LEFT pleural effusion suspected. Visualized bowel gas pattern is nonobstructive. No evidence for free intraperitoneal air. IMPRESSION: Feeding tube tip overlies the distal stomach/duodenum. Electronically Signed   By: Nolon Nations M.D.   On: 03/01/2022 16:15   EEG adult  Result Date: 02/27/2022 Lora Havens, MD     02/27/2022 11:49  AM Patient Name: Kenneth Woods MRN: [id number] Epilepsy Attending: Lora Havens Referring Physician/Provider: Jonetta Osgood, MD Date: 02/27/2022 Duration: 22.05 mins Patient history: 88 year old male with altered mental status.  EEG to evaluate for seizure. Level of alertness: Awake AEDs during EEG study: None Technical aspects: This EEG study was done with scalp electrodes positioned according to the 10-20 International system of electrode placement. Electrical activity was acquired at a sampling rate of 500Hz  and reviewed with a high frequency filter of 70Hz  and a low frequency filter of 1Hz . EEG data were recorded continuously and digitally stored. Description: No clear posterior dominant rhythm was seen.  EEG showed continuous generalized 3 to 5 Hz theta-delta slowing.  Hyperventilation and photic stimulation were not performed.   ABNORMALITY - Continuous slow, generalized IMPRESSION: This study is suggestive of moderate diffuse encephalopathy, nonspecific etiology. No seizures or epileptiform discharges were seen throughout the recording. Priyanka Barbra Sarks   US SCROTUM W/DOPPLER  Result Date: 02/27/2022 CLINICAL DATA:  Orchitis and epididymitis EXAM: SCROTAL ULTRASOUND DOPPLER ULTRASOUND OF THE TESTICLES TECHNIQUE: Complete ultrasound examination of the testicles, epididymis, and other scrotal structures was performed. Color and spectral Doppler ultrasound were also utilized to evaluate blood flow to the testicles. COMPARISON:  02/16/2022 and 01/27/2022 FINDINGS: Right testicle Measurements: 4.8 x 1.8 x 3.5 cm. Mass effect on the right testis due to the right scrotal collection (described above), grossly unchanged. No mass or microlithiasis visualized. Left testicle Measurements: 4.4 x 2.5 x 2.7 cm. Stable heterogeneous parenchymal appearance. No mass or microlithiasis visualized. Right epididymis:  Poorly visualized due to mass effect. Left epididymis:  Normal in size and appearance. Hydrocele:  Present bilaterally, right greater than left. Septated right scrotal collection measures 7.2 x 3.5 x 6.1 cm, previously 7.3 x 4.4 x 5.1 cm, grossly unchanged. Varicocele:  None visualized. Pulsed Doppler interrogation of both testes demonstrates a normal low resistance arterial and venous waveforms on the left, with a sluggish/diminished arterial peak on the right secondary to mass effect, although patent. IMPRESSION: No evidence of testicular torsion. Stable complex right hydrocele with mass effect on the right testis. Electronically Signed   By: Julian Hy M.D.   On: 02/27/2022 02:39   US SCROTUM W/DOPPLER  Addendum Date: 02/16/2022   ADDENDUM REPORT: 02/16/2022 10:28 ADDENDUM: Mass effect with resultant early vascular compromise/increased vascular resistance involving the RIGHT testis secondary to enlargement of RIGHT scrotal collection was discussed with the provider as outlined below. Critical Value/emergent results were called by telephone at the time of interpretation on 02/16/2022 at 10:28 am to provider [facility] , who verbally acknowledged these results. Electronically Signed   By: Zetta Bills M.D.   On: 02/16/2022 10:28   Result Date: 02/16/2022 CLINICAL  DATA:  Testicular pain with worsening since prior imaging. EXAM: SCROTAL ULTRASOUND DOPPLER ULTRASOUND OF THE TESTICLES TECHNIQUE: Complete ultrasound examination of the testicles, epididymis, and other scrotal structures was performed. Color and spectral Doppler ultrasound were also utilized to evaluate blood flow to the testicles. COMPARISON:  Comparison made with imaging from January 27, 2022 FINDINGS: Right testicle Measurements: 6.1 x 2.0 x 1.9 cm. Heterogeneous testis which is elongated and suffers from mass effect from an adjacent complex hydrocele such that the border along the margin with the hydrocele and the testis is straight implying mass effect and altering the normal shape of the RIGHT testis. Venous and arterial flow can be  documented within the testis though with tardus parvus waveform of the testis exhibited and some increased resistance as compared to previous imaging within the testis. Left testicle Measurements: 4.7 x 2.3 x 2.6 cm. Heterogeneity of the LEFT testis is very similar to the prior exam. No focal mass lesion. Right epididymis:  Not well visualized. Left epididymis: Potential tiny cyst in the LEFT epididymis. Heterogeneous appearance of the LEFT epididymis. Hydrocele: Increase in size and resultant mass-effect upon the RIGHT testis. Hydroceles shows septations and some internal debris. Scrotal collection measuring 7.3 x 4.4 x 5.1 cm, markedly increased in size compared to previous imaging. Varicocele:  None visualized. Pulsed Doppler interrogation of both testes demonstrates tardus parvus waveform on the RIGHT with preserved venous and arterial flow but with increased vascular resistance due to testicular compression. LEFT testis with normal low resistance arterial and venous waveforms. IMPRESSION: Findings of orchitis seen on the prior study have progressed with marked enlargement of a complex RIGHT hydrocele, potentially pyocele of the RIGHT scrotum which shows mass effect upon the RIGHT testis such that there is a tardus parvus waveform and early vascular compromise. Mass effect exhibited by flattening/elongation of the testis and straightening of the boundary between the testicular/scrotal collection interface. Urologic consultation is suggested. LEFT testis with heterogeneity favored to be related to prior orchitis. Consider 6-8 week follow-up to ensure stability. Electronically Signed: By: Zetta Bills M.D. On: 02/16/2022 10:23   DG Hips Bilat W or Wo Pelvis 3-4 Views  Result Date: 02/26/2022 CLINICAL DATA:  Trauma, fall EXAM: DG HIP (WITH OR WITHOUT PELVIS) 3-4V BILAT COMPARISON:  None Available. FINDINGS: No recent fracture or dislocation is seen. There is no significant narrowing of joint space in both  hips. Arterial calcifications are seen in the soft tissues. Degenerative changes are noted in the visualized lower lumbar spine. IMPRESSION: No recent fracture or dislocation is seen in both hips. Lumbar spondylosis. Electronically Signed   By: Elmer Picker M.D.   On: 02/26/2022 13:22   VAS [place] LOWER EXTREMITY VENOUS (DVT)  Result Date: 02/27/2022  Lower Venous DVT Study Patient Name:  Kenneth [facility]  Date of Exam:   02/27/2022 Medical Rec #: [id number]        Accession #:    [id number] Date of Birth: 01-Nov-1933        Patient Gender: M Patient Age:   7 years Exam Location:  [facility] Procedure:      VAS [place] LOWER EXTREMITY VENOUS (DVT) Referring Phys: Jennette Kettle --------------------------------------------------------------------------------  Indications: Pain.  Risk Factors: Orchitis and epididymitis for the past month. Limitations: Movement, non compliant. Comparison Study: No prior study on file Performing Technologist: Sharion Dove RVS  Examination Guidelines: A complete evaluation includes B-mode imaging, spectral Doppler, color Doppler, and power Doppler as needed of all accessible portions of each vessel.  Bilateral testing is considered an integral part of a complete examination. Limited examinations for reoccurring indications may be performed as noted. The reflux portion of the exam is performed with the patient in reverse Trendelenburg.  +---------+---------------+---------+-----------+----------+--------------+ RIGHT    CompressibilityPhasicitySpontaneityPropertiesThrombus Aging +---------+---------------+---------+-----------+----------+--------------+ CFV      Full           Yes      Yes                                 +---------+---------------+---------+-----------+----------+--------------+ SFJ      Full                                                        +---------+---------------+---------+-----------+----------+--------------+ FV Prox  Full                                                         +---------+---------------+---------+-----------+----------+--------------+ FV Mid   Full                                                        +---------+---------------+---------+-----------+----------+--------------+ FV DistalFull                                                        +---------+---------------+---------+-----------+----------+--------------+ PFV      Full                                                        +---------+---------------+---------+-----------+----------+--------------+ POP      Full           Yes      Yes                                 +---------+---------------+---------+-----------+----------+--------------+ PTV      Full                                                        +---------+---------------+---------+-----------+----------+--------------+ PERO     Full                                                        +---------+---------------+---------+-----------+----------+--------------+   +---------+---------------+---------+-----------+----------+--------------+ LEFT     CompressibilityPhasicitySpontaneityPropertiesThrombus Aging +---------+---------------+---------+-----------+----------+--------------+  CFV      Full           Yes      Yes                                 +---------+---------------+---------+-----------+----------+--------------+ SFJ      Full                                                        +---------+---------------+---------+-----------+----------+--------------+ FV Prox  Full                                                        +---------+---------------+---------+-----------+----------+--------------+ FV Mid   Full                                                        +---------+---------------+---------+-----------+----------+--------------+ FV DistalFull                                                         +---------+---------------+---------+-----------+----------+--------------+ PFV      Full                                                        +---------+---------------+---------+-----------+----------+--------------+ POP                     Yes      Yes                                 +---------+---------------+---------+-----------+----------+--------------+ PTV      Full                                                        +---------+---------------+---------+-----------+----------+--------------+ PERO     Full                                                        +---------+---------------+---------+-----------+----------+--------------+     Summary: BILATERAL: - No evidence of deep vein thrombosis seen in the lower extremities, bilaterally. -No evidence of popliteal cyst, bilaterally.   *See table(s) above for measurements and observations. Electronically signed by Waverly Ferrari MD on 02/27/2022 at 4:22:05 PM.    Final  No future appointments.    LOS: 4 days

## 2022-03-02 NOTE — Evaluation (Signed)
Clinical/Bedside Swallow Evaluation Patient Details  Name: Kenneth Woods MRN: [id number] Date of Birth: 03-27-1934  Today's Date: 03/02/2022 Time: SLP Start Time (ACUTE ONLY): 1143 SLP Stop Time (ACUTE ONLY): 1200 SLP Time Calculation (min) (ACUTE ONLY): 17 min  Past Medical History:  Past Medical History:  Diagnosis Date   Arthritis    Back pain, chronic    BPH (benign prostatic hypertrophy) with urinary obstruction    COPD (chronic obstructive pulmonary disease) (HCC)    Elevated PSA 10/16/2010   H/O prostate biopsy    Hemorrhoids, external    Hyperlipidemia    Hypertension    Past Surgical History:  Past Surgical History:  Procedure Laterality Date   FRACTURE SURGERY  1959   Work accident-caused FX whole body expect rt arm,was in body cast- was working on roof- hosp for 5 mos.   INGUINAL HERNIA REPAIR Bilateral 1983   PROSTATE BIOPSY N/A 06/20/2013   Procedure: PROSTATE BIOPSY;  Surgeon: Garnett Farm, MD;  Location: [facility];  Service: Urology;  Laterality: N/A;  1 hour req for this case  ULTRASOUND EQUIPHMENT TO BE PROVIDED BY [facility]   HPI:  Pt is an 88 y.o. male who presented 5/14 with AMS. CXR on admission: Linear densities in the left lower lung fields may suggest crowding of markings due to poor inspiration or subsegmental atelectasis. MRI brain 5/16 and EEG negative. Dx acute metabolic encephalopathy. PMT GOC meeting 5/17: pt's son will speak with family, but would like full scope at this time. Cortrak placed 5/16. Pt battling orchitis and epididymitis for past month. PMH: BPH, HTN, COPD.    Assessment / Plan / Recommendation  Clinical Impression  Pt was seen for bedside swallow evaluation with his son present. Pt's son denied the pt having a history of dysphagia, but stated that the pt only has one tooth and therefore needs some meats to be cooked softer, but is able to eat crunchy things like crackers as well. A complete oral mechanism exam  could not be conducted due to pt's difficulty following commands and pt resisted oral inspection by tightly pursing his lips. He demonstrated reduced bolus awareness and prolonged mastication with regular texture solids. A liquid wash was necessary with this consistency to facilitate complete oral clearance. A dysphagia 2 diet with thin liquids will be initiated at this time. SLP will follow to ensure tolerance and for advancement as clinically indicated. SLP Visit Diagnosis: Dysphagia, unspecified (R13.10)    Aspiration Risk  Mild aspiration risk    Diet Recommendation Dysphagia 2 (Fine chop);Thin liquid   Liquid Administration via: Cup;Straw Medication Administration: Via alternative means (or whole/crushed with puree) Supervision: Staff to assist with self feeding Compensations: Slow rate;Small sips/bites Postural Changes: Seated upright at 90 degrees    Other  Recommendations Oral Care Recommendations: Oral care BID    Recommendations for follow up therapy are one component of a multi-disciplinary discharge planning process, led by the attending physician.  Recommendations may be updated based on patient status, additional functional criteria and insurance authorization.  Follow up Recommendations  (TBD)      Assistance Recommended at Discharge Frequent or constant Supervision/Assistance  Functional Status Assessment Patient has had a recent decline in their functional status and demonstrates the ability to make significant improvements in function in a reasonable and predictable amount of time.  Frequency and Duration min 2x/week  2 weeks       Prognosis Prognosis for Safe Diet Advancement: Good Barriers to Reach Goals:  Cognitive deficits      Swallow Study   General Date of Onset: 03/01/22 HPI: Pt is an 88 y.o. male who presented 5/14 with AMS. CXR on admission: Linear densities in the left lower lung fields may suggest crowding of markings due to poor inspiration or  subsegmental atelectasis. MRI brain 5/16 and EEG negative. Dx acute metabolic encephalopathy. PMT GOC meeting 5/17: pt's son will speak with family, but would like full scope at this time. Cortrak placed 5/16. Pt battling orchitis and epididymitis for past month. PMH: BPH, HTN, COPD. Type of Study: Bedside Swallow Evaluation Previous Swallow Assessment: none Diet Prior to this Study: NPO;NG Tube Temperature Spikes Noted: No Respiratory Status: Room air History of Recent Intubation: No Behavior/Cognition: Alert;Cooperative;Pleasant mood;Requires cueing;Doesn't follow directions;Distractible Oral Cavity Assessment:  (some secretions noted; pt did not allow oral inspection) Oral Care Completed by SLP: No Oral Cavity - Dentition:  (Pt reportedly has one tooth) Self-Feeding Abilities: Total assist Patient Positioning: Upright in bed;Postural control adequate for testing Baseline Vocal Quality: Low vocal intensity Volitional Cough: Cognitively unable to elicit Volitional Swallow: Unable to elicit    Oral/Motor/Sensory Function Overall Oral Motor/Sensory Function:  (UTA)   Ice Chips Ice chips: Within functional limits Presentation: Spoon   Thin Liquid Thin Liquid: Within functional limits Presentation: Straw;Cup    Nectar Thick Nectar Thick Liquid: Not tested   Honey Thick Honey Thick Liquid: Not tested   Puree Puree: Within functional limits Presentation: Spoon   Solid     Solid: Impaired Oral Phase Impairments: Poor awareness of bolus Oral Phase Functional Implications: Impaired mastication     Charda Janis I. Vear Clock, MS, CCC-SLP Acute Rehabilitation Services Office number [phone] Pager [phone]  Scheryl Marten 03/02/2022,12:29 PM

## 2022-03-02 NOTE — Progress Notes (Signed)
PROGRESS NOTE        PATIENT DETAILS Name: Kenneth Woods Age: 88 y.o. Sex: male Date of Birth: 08-03-1934 Admit Date: 02/26/2022 Admitting Physician Mckinley Jewel, MD PCP:No primary care provider on file.  Brief Summary: Patient is a 88 y.o.  male with history of BPH, recent history of right epididymitis-has completed a course of Levaquin-most recently placed on Bactrim on 5/4 ED visit-presented to the hospital with altered mental status-subsequently admitted to the hospitalist service for further evaluation and treatment.  Significant events: 5/14>> admit to [facility] for AMS/leukopenia/thrombocytopenia.  Acute urinary retention-requiring Foley catheter insertion. 5/17>>Cortak tube inserted-tube feeds started  Significant studies: 5/14>> CT head: No acute findings. 5/14>> CT abdomen/pelvis: No acute findings-marked prostate hypertrophy/chronic bladder outlet obstruction. 5/14>> x-ray left knee: No fracture/dislocation 5/14>> x-ray hip/pelvis: No fracture/dislocation 5/14>> CXR: No pneumonia 5/14>>NH4: normal 5/15>>B12 level:normal 5/15>>B1 level: 81 (low normal) 5/15>> ultrasound scrotum: No torsion-stable complex right hydrocele with mass effect on the right testes. 5/15>>EEG:no seizures 5/15>> lower extremity Doppler: No DVT. 5/16>> MRI pelvis: No findings suspicious of periurethral abscess. 5/16>> MRI brain: No acute infarct.  Significant microbiology data: 5/14>> blood culture: No growth 5/14>> urine culture: No growth 5/15>> COVID/influenza PCR: Negative 5/15>> hydrocele culture: No growth  Procedures: 5/15>> bedside aspiration from right hydrocele by urology  Consults: Urology, hematology, neurology, palliative care, infectious disease  Subjective: A bit improved today-more awake-sometimes able to answer questions-seems to be speaking quite a bit today.  Son Kenneth Woods at bedside-acknowledges that he is more responsive to him  today.  Objective: Vitals: Blood pressure 136/60, pulse 63, temperature 97.8 F (36.6 C), temperature source Axillary, resp. rate 18, height 5\' 5"  (1.651 m), weight 62.2 kg, SpO2 93 %.   Exam: Gen Exam: More awake-not in any distress HEENT:atraumatic, normocephalic Chest: B/L clear to auscultation anteriorly CVS:S1S2 regular Abdomen:soft non tender, non distended Extremities:no edema Neurology: Moves all 4 extremities Skin: no rash   Pertinent Labs/Radiology:    Latest Ref Rng & Units 03/02/2022    5:17 AM 03/01/2022    1:25 AM 02/28/2022    1:20 AM  CBC  WBC 4.0 - 10.5 K/uL 13.3   7.6   3.4    Hemoglobin 13.0 - 17.0 g/dL 12.5   13.0   11.4    Hematocrit 39.0 - 52.0 % 38.2   40.2   35.4    Platelets 150 - 400 K/uL 104   74     73   52     54      Lab Results  Component Value Date   NA 142 03/02/2022   K 3.9 03/02/2022   CL 114 (H) 03/02/2022   CO2 25 03/02/2022       Assessment/Plan: Acute metabolic encephalopathy: Etiology unclear-neuroimaging negative for significant abnormalities-EEG negative for seizures.  Although felt unlikely to have Wernicke's encephalopathy-remains on high-dose IV thiamine (person used to drink heavily till last year-now a social drinker).  Thankfully encephalopathy seems to be a bit better today-continue supportive care.   Pancytopenia-mild coagulopathy-possible DIC: Counts improving-fibrinogen levels are actually better today.  Continue to watch closely-hematology following.  Recurrent right epididymitis-reactive hydrocele: Had completed 2 courses of fluoroquinolone in the outpatient setting-Bactrim started on 5/4 on ED visit.  Clinically does not have any significant epididymitis on exam.  Has a reactive hydrocele-that was aspirated at bedside by urology-cultures negative  as well.  Remains on empiric Rocephin and doxycycline per ID  Note-sepsis ruled out.  AKI: Mild-resolved-probably hemodynamic related kidney injury and from acute urinary  retention.  Acute urinary retention: S/p Foley catheter inserted in the ED-given severe encephalopathy-continue Foley catheter.  Already on finasteride/Flomax for history of BPH.  Discussed with urology-Dr Milford Cage on 5/17-continue Foley catheter on discharge-has a very large prostate-urology will address Foley catheter issue on follow-up in the office.  Dysphagia: Due to severe encephalopathy-NG tube feedings ongoing-since encephalopathy a bit better-awaiting SLP evaluation to see if he is safe for oral intake.  BPH: Continue finasteride/Flomax  Bilateral calf pain: Dopplers negative.  HTN: BP stable without the use of any antihypertensives-follow-up.  3.4 cm abdominal aortic aneurysm: Defer further to the outpatient setting  Palliative care: Full code-continue full scope of treatment-palliative care following.  Nutrition Status: Nutrition Problem: Severe Malnutrition Etiology: chronic illness (COPD) Signs/Symptoms: severe muscle depletion, severe fat depletion Interventions: Tube feeding, MVI, Prostat    BMI: Estimated body mass index is 22.82 kg/m as calculated from the following:   Height as of this encounter: 5\' 5"  (1.651 m).   Weight as of this encounter: 62.2 kg.   Code status:   Code Status: Full Code   DVT Prophylaxis: SCDs Start: 02/27/22 0102   Family Communication: Ronnell Freshwater [phone]-updated over the phone 5/15, updated son Kenneth Woods at bedside on 5/18   Disposition Plan: Status is: Inpatient Remains inpatient appropriate because: Severe encephalopathy-not yet at baseline-we will require several days of hospitalization.   Planned Discharge Destination:Skilled nursing facility vs SNF   Diet: Diet Order             Diet NPO time specified Except for: Sips with Meds  Diet effective now                     Antimicrobial agents: Anti-infectives (From admission, onward)    Start     Dose/Rate Route Frequency Ordered Stop   02/28/22 1400   doxycycline (VIBRAMYCIN) 100 mg in sodium chloride 0.9 % 250 mL IVPB        100 mg 125 mL/hr over 120 Minutes Intravenous Every 12 hours 02/28/22 1314 03/07/22 0959   02/28/22 1230  doxycycline (VIBRA-TABS) tablet 100 mg  Status:  Discontinued        100 mg Oral Every 12 hours 02/28/22 1133 02/28/22 1314   02/27/22 1700  vancomycin (VANCOREADY) IVPB 750 mg/150 mL  Status:  Discontinued        750 mg 150 mL/hr over 60 Minutes Intravenous Every 24 hours 02/26/22 1638 02/27/22 1018   02/27/22 1115  cefTRIAXone (ROCEPHIN) 2 g in sodium chloride 0.9 % 100 mL IVPB        2 g 200 mL/hr over 30 Minutes Intravenous Every 24 hours 02/27/22 1021 03/04/22 2359   02/26/22 2200  ceFEPIme (MAXIPIME) 2 g in sodium chloride 0.9 % 100 mL IVPB  Status:  Discontinued        2 g 200 mL/hr over 30 Minutes Intravenous Every 12 hours 02/26/22 1618 02/27/22 1018   02/26/22 1615  vancomycin (VANCOCIN) IVPB 1000 mg/200 mL premix        1,000 mg 200 mL/hr over 60 Minutes Intravenous  Once 02/26/22 1601 02/26/22 1811        MEDICATIONS: Scheduled Meds:  Chlorhexidine Gluconate Cloth  6 each Topical Daily   feeding supplement (PROSource TF)  45 mL Per Tube BID   finasteride  5 mg Oral Daily  free water  150 mL Per Tube Q4H   tamsulosin  0.4 mg Oral Daily   Continuous Infusions:  cefTRIAXone (ROCEPHIN)  IV 2 g (03/01/22 1200)   doxycycline (VIBRAMYCIN) IV 100 mg (03/02/22 0934)   feeding supplement (JEVITY 1.5 CAL/FIBER) 35 mL/hr at 03/02/22 0730   potassium PHOSPHATE IVPB (in mmol)     thiamine injection 500 mg (03/02/22 0650)   PRN Meds:.albuterol, LORazepam, ondansetron **OR** ondansetron (ZOFRAN) IV   I have personally reviewed following labs and imaging studies  LABORATORY DATA: CBC: Recent Labs  Lab 02/26/22 1401 02/26/22 1503 02/26/22 2314 02/27/22 0408 02/28/22 0120 03/01/22 0125 03/02/22 0517  WBC 3.2*  --   --  3.0* 3.4* 7.6 13.3*  NEUTROABS 2.4  --   --   --   --  1.4*  --   HGB  13.9 14.3  --  11.6* 11.4* 13.0 12.5*  HCT 42.3 42.0  --  34.1* 35.4* 40.2 38.2*  MCV 87.0  --   --  86.8 87.8 88.5 87.0  PLT 42*  --  39* 39* 54*  52* 73*  74* 104*     Basic Metabolic Panel: Recent Labs  Lab 02/26/22 1207 02/26/22 1503 02/27/22 0123 02/28/22 0120 03/01/22 0125 03/01/22 1647 03/02/22 0517  NA 135 134* 134* 138 143  --  142  K 4.4 4.3 4.4 3.9 4.1  --  3.9  CL 98  --  107 111 116*  --  114*  CO2 30  --  20* 21* 21*  --  25  GLUCOSE 141*  --  97 90 78  --  119*  BUN 43*  --  35* 28* 27*  --  24*  CREATININE 1.33*  --  0.97 1.04 0.98  --  0.81  CALCIUM 9.4  --  8.5* 8.3* 8.8*  --  8.6*  MG 2.9*  --   --   --   --  2.2 2.1  PHOS  --   --   --   --   --  1.9* 1.9*     GFR: Estimated Creatinine Clearance: 56.9 mL/min (by C-G formula based on SCr of 0.81 mg/dL).  Liver Function Tests: Recent Labs  Lab 02/26/22 1401 02/27/22 0123 02/28/22 0120 03/01/22 0125 03/02/22 0517  AST 47* 47* 37 40 39  ALT 33 32 26 25 24   ALKPHOS 64 57 47 47 44  BILITOT 0.5 0.5 0.3 0.2* 0.6  PROT 6.2* 5.2* 4.5* 4.7* 4.6*  ALBUMIN 3.4* 2.4* 2.0* 2.0* 2.0*    No results for input(s): LIPASE, AMYLASE in the last 168 hours. Recent Labs  Lab 02/26/22 1401  AMMONIA 33     Coagulation Profile: Recent Labs  Lab 02/26/22 1401 02/26/22 2314 02/28/22 0120 03/01/22 0125  INR 1.1 1.2 1.4* 1.7*     Cardiac Enzymes: Recent Labs  Lab 02/27/22 0123  CKTOTAL 103     BNP (last 3 results) No results for input(s): PROBNP in the last 8760 hours.  Lipid Profile: No results for input(s): CHOL, HDL, LDLCALC, TRIG, CHOLHDL, LDLDIRECT in the last 72 hours.  Thyroid Function Tests: No results for input(s): TSH, T4TOTAL, FREET4, T3FREE, THYROIDAB in the last 72 hours.   Anemia Panel: Recent Labs    02/27/22 1137  VITAMINB12 683     Urine analysis:    Component Value Date/Time   COLORURINE YELLOW 02/26/2022 1401   APPEARANCEUR CLEAR 02/26/2022 1401   LABSPEC  1.022 02/26/2022 1401   PHURINE 5.5 02/26/2022 1401  GLUCOSEU NEGATIVE 02/26/2022 1401   HGBUR MODERATE (A) 02/26/2022 1401   BILIRUBINUR NEGATIVE 02/26/2022 1401   KETONESUR NEGATIVE 02/26/2022 1401   PROTEINUR 30 (A) 02/26/2022 1401   UROBILINOGEN 0.2 06/20/2013 2028   NITRITE NEGATIVE 02/26/2022 1401   LEUKOCYTESUR NEGATIVE 02/26/2022 1401    Sepsis Labs: Lactic Acid, Venous    Component Value Date/Time   LATICACIDVEN 1.7 02/27/2022 0408    MICROBIOLOGY: Recent Results (from the past 240 hour(s))  Urine Culture     Status: None   Collection Time: 02/26/22  2:01 PM   Specimen: In/Out Cath Urine  Result Value Ref Range Status   Specimen Description   Final    IN/OUT CATH URINE Performed at [facility], [address], [place], [place] [address]    Special Requests   Final    NONE Performed at [facility], [address], [place], [place] [address]    Culture   Final    NO GROWTH Performed at [facility], [place] [address], [place], [place] [address]    Report Status 02/28/2022 FINAL  Final  Culture, blood (Routine X 2) w Reflex to ID Panel     Status: None (Preliminary result)   Collection Time: 02/26/22  4:05 PM   Specimen: BLOOD LEFT FOREARM  Result Value Ref Range Status   Specimen Description   Final    BLOOD LEFT FOREARM Performed at [facility], [address], [place], [place] [address]    Special Requests   Final    BOTTLES DRAWN AEROBIC AND ANAEROBIC Blood Culture adequate volume Performed at [facility], [address] [place], [place] [address]    Culture   Final    NO GROWTH 4 DAYS Performed at [facility], [place] [address], [place], [place] [address]    Report Status PENDING  Incomplete  Culture, blood (Routine X 2) w Reflex to ID Panel     Status: None (Preliminary result)   Collection Time: 02/26/22  4:20 PM   Specimen: BLOOD LEFT WRIST  Result Value  Ref Range Status   Specimen Description   Final    BLOOD LEFT WRIST Performed at [facility], [address] [place], [place] [address]    Special Requests   Final    BOTTLES DRAWN AEROBIC AND ANAEROBIC Blood Culture results may not be optimal due to an inadequate volume of blood received in culture bottles Performed at [facility], [address] [place], [place] [address]    Culture   Final    NO GROWTH 4 DAYS Performed at [facility], [place] [address] [place], [place] [address]    Report Status PENDING  Incomplete  Resp Panel by RT-PCR (Flu A&B, Covid) Nasopharyngeal Swab     Status: None   Collection Time: 02/27/22  1:01 AM   Specimen: Nasopharyngeal Swab; Nasopharyngeal(NP) swabs in vial transport medium  Result Value Ref Range Status   SARS Coronavirus 2 by RT PCR NEGATIVE NEGATIVE Final    Comment: (NOTE) SARS-CoV-2 target nucleic acids are NOT DETECTED.  The SARS-CoV-2 RNA is generally detectable in upper respiratory specimens during the acute phase of infection. The lowest concentration of SARS-CoV-2 viral copies this assay can detect is 138 copies/mL. A negative result does not preclude SARS-Cov-2 infection and should not be used as the sole basis for treatment or other patient management decisions. A negative result may occur with  improper specimen collection/handling, submission of specimen  other than nasopharyngeal swab, presence of viral mutation(s) within the areas targeted by this assay, and inadequate number of viral copies(<138 copies/mL). A negative result must be combined with clinical observations, patient history, and epidemiological information. The expected result is Negative.  Fact Sheet for Patients:  EntrepreneurPulse.com.au  Fact Sheet for Healthcare Providers:  IncredibleEmployment.be  This test is no t yet approved or cleared by the [place] FDA and  has  been authorized for detection and/or diagnosis of SARS-CoV-2 by FDA under an Emergency Use Authorization (EUA). This EUA will remain  in effect (meaning this test can be used) for the duration of the COVID-19 declaration under Section 564(b)(1) of the Act, 21 U.S.C.section 360bbb-3(b)(1), unless the authorization is terminated  or revoked sooner.       Influenza A by PCR NEGATIVE NEGATIVE Final   Influenza B by PCR NEGATIVE NEGATIVE Final    Comment: (NOTE) The Xpert Xpress SARS-CoV-2/FLU/RSV plus assay is intended as an aid in the diagnosis of influenza from Nasopharyngeal swab specimens and should not be used as a sole basis for treatment. Nasal washings and aspirates are unacceptable for Xpert Xpress SARS-CoV-2/FLU/RSV testing.  Fact Sheet for Patients: EntrepreneurPulse.com.au  Fact Sheet for Healthcare Providers: IncredibleEmployment.be  This test is not yet approved or cleared by the [place] FDA and has been authorized for detection and/or diagnosis of SARS-CoV-2 by FDA under an Emergency Use Authorization (EUA). This EUA will remain in effect (meaning this test can be used) for the duration of the COVID-19 declaration under Section 564(b)(1) of the Act, 21 U.S.C. section 360bbb-3(b)(1), unless the authorization is terminated or revoked.  Performed at [facility], [place] [address], [place], [place] [address]   MRSA Next Gen by PCR, Nasal     Status: None   Collection Time: 02/27/22  4:03 AM   Specimen: Nasal Mucosa; Nasal Swab  Result Value Ref Range Status   MRSA by PCR Next Gen NOT DETECTED NOT DETECTED Final    Comment: (NOTE) The GeneXpert MRSA Assay (FDA approved for NASAL specimens only), is one component of a comprehensive MRSA colonization surveillance program. It is not intended to diagnose MRSA infection nor to guide or monitor treatment for MRSA infections. Test performance is not FDA approved in patients less  than 55 years old. Performed at [facility], [place] [address], [place], [place] [address]   Body fluid culture w Gram Stain     Status: None (Preliminary result)   Collection Time: 02/27/22  7:36 AM   Specimen: Body Fluid  Result Value Ref Range Status   Specimen Description FLUID  Final   Special Requests HYDROCELE SAC  Final   Gram Stain NO WBC SEEN NO ORGANISMS SEEN   Final   Culture   Final    NO GROWTH 3 DAYS Performed at [facility], [address], [place], [place] [address]    Report Status PENDING  Incomplete    RADIOLOGY STUDIES/RESULTS: MR BRAIN WO CONTRAST  Result Date: 02/28/2022 CLINICAL DATA:  Mental status change. EXAM: MRI HEAD WITHOUT CONTRAST TECHNIQUE: Multiplanar, multiecho pulse sequences of the brain and surrounding structures were obtained without intravenous contrast. COMPARISON:  None Available. FINDINGS: Brain: No acute infarct, mass effect or extra-axial collection. Chronic Microhemorrhage in the left frontal operculum. There is multifocal hyperintense T2-weighted signal within the white matter. Generalized cerebral volume loss. The midline structures are normal. Vascular: Major flow voids are preserved. Skull and upper cervical spine: Normal calvarium and  skull base. Visualized upper cervical spine and soft tissues are normal. Sinuses/Orbits:No paranasal sinus fluid levels or advanced mucosal thickening. No mastoid or middle ear effusion. Normal orbits. IMPRESSION: 1. No acute intracranial abnormality. 2. Generalized cerebral volume loss and chronic small vessel disease. Electronically Signed   By: Ulyses Jarred M.D.   On: 02/28/2022 22:30   MR PELVIS W WO CONTRAST  Result Date: 02/28/2022 CLINICAL DATA:  Orchitis and epididymitis. Evaluate possible lesion at the base of the urethra on CT (per urology note). EXAM: MRI PELVIS WITHOUT AND WITH CONTRAST TECHNIQUE: Multiplanar multisequence MR imaging of the pelvis was performed both before and after  administration of intravenous contrast. CONTRAST:  56mL GADAVIST GADOBUTROL 1 MMOL/ML IV SOLN COMPARISON:  Scrotal ultrasound dated 02/27/2022. CT abdomen/pelvis dated 02/26/2022. FINDINGS: Markedly limited evaluation due to patient motion. Urinary Tract: Thick-walled bladder, decompressed by an indwelling Foley catheter. Bowel:  Visualized bowel is grossly unremarkable. Vascular/Lymphatic: No evidence of aneurysm. No suspicious pelvic lymphadenopathy. Reproductive: Prostatomegaly, with marked enlargement of the central gland indenting the base of the bladder, reflecting BPH. Right scrotal hydrocele (series 9/image 18), poorly visualized. No abnormality of the penile urethra. Specifically, no findings suspicious for periurethral abscess on MR. Other:  Small volume pelvic ascites. Musculoskeletal: Degenerative changes of the lumbar spine, with grade 1 anterolisthesis of L5 on S1. IMPRESSION: Markedly limited evaluation due to patient motion. No findings suspicious for periurethral abscess on MR. Right scrotal hydrocele, poorly visualized, better evaluated recent scrotal ultrasound. Marked BPH. Thick-walled bladder, decompressed by an indwelling Foley catheter. Electronically Signed   By: Julian Hy M.D.   On: 02/28/2022 21:43   DG Abd Portable 1V  Result Date: 03/01/2022 CLINICAL DATA:  Feeding tube placement. EXAM: PORTABLE ABDOMEN - 1 VIEW COMPARISON:  CT exam on 02/26/2022 FINDINGS: Interval placement of feeding tube, tip overlying the level of the distal stomach or proximal duodenum. LEFT pleural effusion suspected. Visualized bowel gas pattern is nonobstructive. No evidence for free intraperitoneal air. IMPRESSION: Feeding tube tip overlies the distal stomach/duodenum. Electronically Signed   By: Nolon Nations M.D.   On: 03/01/2022 16:15     LOS: 4 days   Oren Binet, MD  [facility]    To contact the attending provider between 7A-7P or the covering provider during after hours  7P-7A, please log into the web site [email or web address] and access using universal [place] password for that web site. If you do not have the password, please call the hospital operator.  03/02/2022, 11:36 AM

## 2022-03-02 NOTE — Progress Notes (Signed)
Daily Progress Note   Patient Name: Kenneth Woods       Date: 03/02/2022 DOB: [birth year]  Age: 88 y.o. MRN#: [id number] Attending Physician: Maretta Bees, MD Primary Care Physician: No primary care provider on file. Admit Date: 02/26/2022  Reason for Consultation/Follow-up: Establishing goals of care  Subjective: Patient appears to be more awake today but unable to follow commands, looks at me while I am talking to him, no family at bedside  Length of Stay: 4  Current Medications: Scheduled Meds:   Chlorhexidine Gluconate Cloth  6 each Topical Daily   feeding supplement (PROSource TF)  45 mL Per Tube BID   finasteride  5 mg Oral Daily   free water  150 mL Per Tube Q4H   tamsulosin  0.4 mg Oral Daily    Continuous Infusions:  cefTRIAXone (ROCEPHIN)  IV 2 g (03/01/22 1200)   doxycycline (VIBRAMYCIN) IV 100 mg (03/02/22 0934)   feeding supplement (JEVITY 1.5 CAL/FIBER) 35 mL/hr at 03/02/22 0730   potassium PHOSPHATE IVPB (in mmol)     thiamine injection 500 mg (03/02/22 0650)    PRN Meds: albuterol, LORazepam, ondansetron **OR** ondansetron (ZOFRAN) IV  Physical Exam Constitutional:      General: He is not in acute distress.    Appearance: He is ill-appearing.  Pulmonary:     Effort: Pulmonary effort is normal.  Skin:    General: Skin is warm and dry.  Neurological:     Mental Status: He is alert.  Psychiatric:     Comments: Slightly agitated, restless            Vital Signs: BP 136/60 (BP Location: Left Arm)   Pulse 63   Temp 97.8 F (36.6 C) (Axillary)   Resp 18   Ht 5\' 5"  (1.651 m)   Wt 62.2 kg   SpO2 93%   BMI 22.82 kg/m  SpO2: SpO2: 93 % O2 Device: O2 Device: Room Air O2 Flow Rate: O2 Flow Rate (L/min): 1 L/min  Intake/output summary:  Intake/Output Summary  (Last 24 hours) at 03/02/2022 1019 Last data filed at 03/02/2022 0506 Gross per 24 hour  Intake 1003.33 ml  Output 1650 ml  Net -646.67 ml   LBM:   Baseline Weight: Weight: 68 kg Most recent weight: Weight: 62.2 kg       Palliative Assessment/Data: PPS 20%    Flowsheet Rows    Flowsheet Row Most Recent Value  Intake Tab   Referral Department Hospitalist  Unit at Time of Referral Intermediate Care Unit  Palliative Care Primary Diagnosis Sepsis/Infectious Disease  Date Notified 02/28/22  Palliative Care Type New Palliative care  Reason for referral Clarify Goals of Care  Date of Admission 02/26/22  Date first seen by Palliative Care 03/01/22  # of days Palliative referral response time 1 Day(s)  # of days IP prior to Palliative referral 2  Clinical Assessment   Psychosocial & Spiritual Assessment   Palliative Care Outcomes        Patient Active Problem List   Diagnosis Date Noted   Protein-calorie malnutrition, severe 03/01/2022   Thrombocytopenia (HCC) 02/27/2022   Abdominal pain 02/27/2022   Bilateral calf pain 02/27/2022   Orchitis, epididymitis,  and epididymo-orchitis 02/27/2022   Acute encephalopathy 02/26/2022   COPD exacerbation (HCC) 12/13/2015   Elevated troponin 12/13/2015   Hypokalemia 12/13/2015   Retention of urine    Hyperlipidemia    Hypertension    COPD (chronic obstructive pulmonary disease) (HCC)    Arthritis    BPH with elevated PSA    Elevated PSA 10/16/2010    Palliative Care Assessment & Plan   HPI: 88 y.o. male  with past medical history of BPH, HTN, and COPD admitted on 02/26/2022 with AMS. Pt has been battling with orchitis and epididymitis for past month. Most recently pt seen in ED on 5/4.  Epididymitis and orchitis was worsening as demonstrated on [place] then despite ABx treatment as outpt. This admission, patient's acute encephalopathy continues with unclear etiology. Neuroimaging negative for acute abnormalities and EEG negative for  seizures. Poor PO intake d/t mental status - pursuing Cortrak placement 5/17.  Also with concern about DIC.  Patient continues on antibiotics given concern for possible infectious etiology.  MRI pelvis negative for any acute process.  PMT consulted to discuss goals of care.  Assessment: No family at bedside during my visit.  Called son but no answer left voicemail with my callback number and requesting call back to further discuss goals of care.  Patient appears more alert today.  Feeding tube has been placed along with restraints.  Nurse reports patient also seems more alert to her today, tells me he did well with swallowing evaluation and will be started on a dysphagia 2 diet.  No other concerns per nursing staff.  Recommendations/Plan: Per conversation with son yesterday he is interested in continuing current course and allowing time for outcomes -seems there is some improvement mental status and p.o. intake Attempted to follow-up with son today however not at bedside no answer when I called -DNR has been recommended but needs further discussion Palliative will follow along  Code Status: Full code   Care plan was discussed with RN  Thank you for allowing the Palliative Medicine Team to assist in the care of this patient.   *Please note that this is a verbal dictation therefore any spelling or grammatical errors are due to the "Dragon [employer] One" system interpretation.  Gerlean Ren, DNP, [facility] Palliative Medicine Team Team Phone # [phone]  Pager [phone]

## 2022-03-02 NOTE — Progress Notes (Signed)
[facility]  Date of Admission:  02/26/2022     Abx: 5/16-c doxy 5/15-c ceftriaxone     5/14 vanc 5/14 cefepime                                                    Outpatient bactrim 5/04-16; levoflox 4/14-5/16     Assessment: 88 yo male with bph/boo on tamsulosin/finasteride, recurrent epididymitis here for same complicated by ams and leukopenia/thrombocytopenia   He has been followed by urology and has received 2 courses abx, initially with 3 weeks moxifloxacin and then bactrim. He had near resolution of sx each time and was able to function again but recurrent symptoms of severe testicular pain/swelling and this admission also metabolic encephalopathy   He was seen by urology 5/15 who performed right sided hydrocele aspiration. There was bilateral scrotal swelling and mild tenderness on their exam. He has no erythema there. A foley was placed due to bph/urinary retention. At this time they do not detect any abscess   He also has relative leukopenia and thrombocytopenia thought to be sepsis/dic related. I also consider bactrim side effect. onc evaluated         As of today 5/16 he remains rather stable sick since admission on 5/14. I suspect his prostate enlargement a contributing factor to relapse. I also consider rare infection due to mycoplasma which could be fairly resistant. As susceptibility is difficult to achieve, doxycycline with potential repeat quinolone course wouldn't be unreasonable   An mri is suggested to look for gu abscess   I am concerned though he had received rather a long course with appropriate empiric abx (1 month levo). Urology is considering prostate embolization to help with obstruction, which is not unreasonable if current treatment is without help  ----------------- 5/18 assessment Mri on 5/16 of pelvis doesn't suggest any obvious abscess/orchitis/epididymitis. Clinically acting like orchitis/epididymitis  although his response to abx is rather atypical for this process and a viral process course is also rather atypical  Would benefit from ongoing urology evaluation for bph treatment -- query if related to sx/hydrocele?  The last 48 hours mentation much better and not complaining of pain in his scrotal area  Hydrocele fluid negative on cx   Plan: Finish ctrx on 5/20 and doxy on 5/22 No need for id clinic f/u Would need urology outpatient follow up Will sign off Discussed with primary team    I spent more than 35 minute reviewing data/chart, and coordinating care and >50% direct face to face time providing counseling/discussing diagnostics/treatment plan with patient   Principal Problem:   Acute encephalopathy Active Problems:   Retention of urine   Thrombocytopenia (HCC)   Abdominal pain   Bilateral calf pain   Orchitis, epididymitis, and epididymo-orchitis   Protein-calorie malnutrition, severe   Allergies  Allergen Reactions   Penicillins Anaphylaxis    Scheduled Meds:  Chlorhexidine Gluconate Cloth  6 each Topical Daily   feeding supplement (PROSource TF)  45 mL Per Tube BID   finasteride  5 mg Oral Daily   free water  150 mL Per Tube Q4H   tamsulosin  0.4 mg Oral Daily   Continuous Infusions:  cefTRIAXone (ROCEPHIN)  IV 2 g (03/02/22 1145)   doxycycline (VIBRAMYCIN) IV 100 mg (03/02/22 0934)  feeding supplement (JEVITY 1.5 CAL/FIBER) 35 mL/hr at 03/02/22 0730   potassium PHOSPHATE IVPB (in mmol) 30 mmol (03/02/22 1229)   thiamine injection 500 mg (03/02/22 0650)   PRN Meds:.albuterol, LORazepam, ondansetron **OR** ondansetron (ZOFRAN) IV   SUBJECTIVE: Mentating better Son said patient doing much better Afebrile No n/v/diarrhea  No complaint of groin pain   Review of Systems: ROS All other ROS was negative, except mentioned above     OBJECTIVE: Vitals:   03/01/22 2301 03/02/22 0309 03/02/22 0751 03/02/22 1201  BP: 125/72 (!) 139/91 136/60 (!)  140/58  Pulse: 70 70 63 (!) 56  Resp: 17 19 18 20   Temp: 97.9 F (36.6 C) 98 F (36.7 C) 97.8 F (36.6 C) 98 F (36.7 C)  TempSrc: Oral Oral Axillary Axillary  SpO2: 92% 93% 93% 94%  Weight:      Height:       Body mass index is 22.82 kg/m.  Physical Exam General/constitutional: no distress, pleasant; not very talkative but verbalize appropriately HEENT: Normocephalic, PER, Conj Clear, EOMI, Oropharynx clear Neck supple CV: rrr no mrg Lungs: clear to auscultation, normal respiratory effort Abd: Soft, Nontender Ext: no edema Skin: No Rash Neuro: nonfocal    Lab Results Lab Results  Component Value Date   WBC 13.3 (H) 03/02/2022   HGB 12.5 (L) 03/02/2022   HCT 38.2 (L) 03/02/2022   MCV 87.0 03/02/2022   PLT 104 (L) 03/02/2022    Lab Results  Component Value Date   CREATININE 0.81 03/02/2022   BUN 24 (H) 03/02/2022   NA 142 03/02/2022   K 3.9 03/02/2022   CL 114 (H) 03/02/2022   CO2 25 03/02/2022    Lab Results  Component Value Date   ALT 24 03/02/2022   AST 39 03/02/2022   ALKPHOS 44 03/02/2022   BILITOT 0.6 03/02/2022      Microbiology: Recent Results (from the past 240 hour(s))  Urine Culture     Status: None   Collection Time: 02/26/22  2:01 PM   Specimen: In/Out Cath Urine  Result Value Ref Range Status   Specimen Description   Final    IN/OUT CATH URINE Performed at [facility], [address] [place], [place]    Special Requests   Final    NONE Performed at [facility], [address] [place], [place]    Culture   Final    NO GROWTH Performed at [facility], [address], [place], [place]    Report Status 02/28/2022 FINAL  Final  Culture, blood (Routine X 2) w Reflex to ID Panel     Status: None (Preliminary result)   Collection Time: 02/26/22  4:05 PM   Specimen: BLOOD LEFT FOREARM  Result Value Ref Range Status   Specimen Description   Final    BLOOD  LEFT FOREARM Performed at [facility], [address] [place], [place]    Special Requests   Final    BOTTLES DRAWN AEROBIC AND ANAEROBIC Blood Culture adequate volume Performed at [facility], [address] [place], [place]    Culture   Final    NO GROWTH 4 DAYS Performed at [facility], [address], [place], [place]    Report Status PENDING  Incomplete  Culture, blood (Routine X 2) w Reflex to ID Panel     Status: None (Preliminary result)   Collection Time: 02/26/22  4:20 PM  Specimen: BLOOD LEFT WRIST  Result Value Ref Range Status   Specimen Description   Final    BLOOD LEFT WRIST Performed at [facility], [address] [place], [place] [address]    Special Requests   Final    BOTTLES DRAWN AEROBIC AND ANAEROBIC Blood Culture results may not be optimal due to an inadequate volume of blood received in culture bottles Performed at [facility], [address], [place], [place] [address]    Culture   Final    NO GROWTH 4 DAYS Performed at [facility], [address], [place], [place] [address]    Report Status PENDING  Incomplete  Resp Panel by RT-PCR (Flu A&B, Covid) Nasopharyngeal Swab     Status: None   Collection Time: 02/27/22  1:01 AM   Specimen: Nasopharyngeal Swab; Nasopharyngeal(NP) swabs in vial transport medium  Result Value Ref Range Status   SARS Coronavirus 2 by RT PCR NEGATIVE NEGATIVE Final    Comment: (NOTE) SARS-CoV-2 target nucleic acids are NOT DETECTED.  The SARS-CoV-2 RNA is generally detectable in upper respiratory specimens during the acute phase of infection. The lowest concentration of SARS-CoV-2 viral copies this assay can detect is 138 copies/mL. A negative result does not preclude SARS-Cov-2 infection and should not be used as the sole basis for treatment or other patient management decisions. A negative result  may occur with  improper specimen collection/handling, submission of specimen other than nasopharyngeal swab, presence of viral mutation(s) within the areas targeted by this assay, and inadequate number of viral copies(<138 copies/mL). A negative result must be combined with clinical observations, patient history, and epidemiological information. The expected result is Negative.  Fact Sheet for Patients:  BloggerCourse.com  Fact Sheet for Healthcare Providers:  SeriousBroker.it  This test is no t yet approved or cleared by the [place] FDA and  has been authorized for detection and/or diagnosis of SARS-CoV-2 by FDA under an Emergency Use Authorization (EUA). This EUA will remain  in effect (meaning this test can be used) for the duration of the COVID-19 declaration under Section 564(b)(1) of the Act, 21 U.S.C.section 360bbb-3(b)(1), unless the authorization is terminated  or revoked sooner.       Influenza A by PCR NEGATIVE NEGATIVE Final   Influenza B by PCR NEGATIVE NEGATIVE Final    Comment: (NOTE) The Xpert Xpress SARS-CoV-2/FLU/RSV plus assay is intended as an aid in the diagnosis of influenza from Nasopharyngeal swab specimens and should not be used as a sole basis for treatment. Nasal washings and aspirates are unacceptable for Xpert Xpress SARS-CoV-2/FLU/RSV testing.  Fact Sheet for Patients: BloggerCourse.com  Fact Sheet for Healthcare Providers: SeriousBroker.it  This test is not yet approved or cleared by the [place] FDA and has been authorized for detection and/or diagnosis of SARS-CoV-2 by FDA under an Emergency Use Authorization (EUA). This EUA will remain in effect (meaning this test can be used) for the duration of the COVID-19 declaration under Section 564(b)(1) of the Act, 21 U.S.C. section 360bbb-3(b)(1), unless the authorization is terminated  or revoked.  Performed at [facility], [address], [place], [place] [address]   MRSA Next Gen by PCR, Nasal     Status: None   Collection Time: 02/27/22  4:03 AM   Specimen: Nasal Mucosa; Nasal Swab  Result Value Ref Range Status   MRSA by PCR Next Gen NOT DETECTED NOT DETECTED Final    Comment: (NOTE) The GeneXpert MRSA Assay (FDA approved  for NASAL specimens only), is one component of a comprehensive MRSA colonization surveillance program. It is not intended to diagnose MRSA infection nor to guide or monitor treatment for MRSA infections. Test performance is not FDA approved in patients less than 74 years old. Performed at [facility], [address] [place], [place] [address]   Body fluid culture w Gram Stain     Status: None   Collection Time: 02/27/22  7:36 AM   Specimen: Body Fluid  Result Value Ref Range Status   Specimen Description FLUID  Final   Special Requests HYDROCELE SAC  Final   Gram Stain NO WBC SEEN NO ORGANISMS SEEN   Final   Culture   Final    NO GROWTH 3 DAYS Performed at [facility], [address] [place], [place] [address]    Report Status 03/02/2022 FINAL  Final     Serology:   Imaging: If present, new imagings (plain films, ct scans, and mri) have been personally visualized and interpreted; radiology reports have been reviewed. Decision making incorporated into the Impression / Recommendations.  5/16 mri pelvis Markedly limited evaluation due to patient motion.   No findings suspicious for periurethral abscess on MR.   Right scrotal hydrocele, poorly visualized, better evaluated recent scrotal ultrasound.   Marked BPH. Thick-walled bladder, decompressed by an indwelling Foley catheter.   5/16 mri brain 1. No acute intracranial abnormality. 2. Generalized cerebral volume loss and chronic small vessel disease.  Raymondo Band, MD [facility] [phone]  pager    03/02/2022, 3:28 PM

## 2022-03-03 DIAGNOSIS — Z7189 Other specified counseling: Secondary | ICD-10-CM | POA: Diagnosis not present

## 2022-03-03 DIAGNOSIS — Z515 Encounter for palliative care: Secondary | ICD-10-CM | POA: Diagnosis not present

## 2022-03-03 DIAGNOSIS — N179 Acute kidney failure, unspecified: Secondary | ICD-10-CM | POA: Diagnosis not present

## 2022-03-03 DIAGNOSIS — G9340 Encephalopathy, unspecified: Secondary | ICD-10-CM | POA: Diagnosis not present

## 2022-03-03 DIAGNOSIS — M79661 Pain in right lower leg: Secondary | ICD-10-CM | POA: Diagnosis not present

## 2022-03-03 DIAGNOSIS — N452 Orchitis: Secondary | ICD-10-CM | POA: Diagnosis not present

## 2022-03-03 LAB — CBC
HCT: 38.5 % — ABNORMAL LOW (ref 39.0–52.0)
Hemoglobin: 13.1 g/dL (ref 13.0–17.0)
MCH: 29 pg (ref 26.0–34.0)
MCHC: 34 g/dL (ref 30.0–36.0)
MCV: 85.4 fL (ref 80.0–100.0)
Platelets: 128 10*3/uL — ABNORMAL LOW (ref 150–400)
RBC: 4.51 MIL/uL (ref 4.22–5.81)
RDW: 14.8 % (ref 11.5–15.5)
WBC: 8.2 10*3/uL (ref 4.0–10.5)
nRBC: 0.4 % — ABNORMAL HIGH (ref 0.0–0.2)

## 2022-03-03 LAB — COMPREHENSIVE METABOLIC PANEL
ALT: 19 U/L (ref 0–44)
AST: 32 U/L (ref 15–41)
Albumin: 2.1 g/dL — ABNORMAL LOW (ref 3.5–5.0)
Alkaline Phosphatase: 56 U/L (ref 38–126)
Anion gap: 4 — ABNORMAL LOW (ref 5–15)
BUN: 19 mg/dL (ref 8–23)
CO2: 28 mmol/L (ref 22–32)
Calcium: 8.6 mg/dL — ABNORMAL LOW (ref 8.9–10.3)
Chloride: 108 mmol/L (ref 98–111)
Creatinine, Ser: 0.7 mg/dL (ref 0.61–1.24)
GFR, Estimated: >60 mL/min (ref >60)
Glucose, Bld: 177 mg/dL — ABNORMAL HIGH (ref 70–99)
Potassium: 3.9 mmol/L (ref 3.5–5.1)
Sodium: 140 mmol/L (ref 135–145)
Total Bilirubin: 0.5 mg/dL (ref 0.3–1.2)
Total Protein: 4.7 g/dL — ABNORMAL LOW (ref 6.5–8.1)

## 2022-03-03 LAB — PHOSPHORUS: Phosphorus: 2.7 mg/dL (ref 2.5–4.6)

## 2022-03-03 LAB — GLUCOSE, CAPILLARY
Glucose-Capillary: 177 mg/dL — ABNORMAL HIGH (ref 70–99)
Glucose-Capillary: 153 mg/dL — ABNORMAL HIGH (ref 70–99)
Glucose-Capillary: 119 mg/dL — ABNORMAL HIGH (ref 70–99)
Glucose-Capillary: 152 mg/dL — ABNORMAL HIGH (ref 70–99)
Glucose-Capillary: 178 mg/dL — ABNORMAL HIGH (ref 70–99)

## 2022-03-03 LAB — CULTURE, BLOOD (ROUTINE X 2)
Culture: NO GROWTH
Culture: NO GROWTH
Special Requests: ADEQUATE

## 2022-03-03 LAB — MAGNESIUM: Magnesium: 2 mg/dL (ref 1.7–2.4)

## 2022-03-03 MED ORDER — ACETAMINOPHEN 325 MG PO TABS
650.0000 mg | ORAL_TABLET | Freq: Four times a day (QID) | ORAL | Status: DC | PRN
  Administered 2022-03-03: 650 mg via ORAL
  Filled 2022-03-03: qty 2

## 2022-03-03 NOTE — Progress Notes (Signed)
Pt would not eat breakfast this am after multiple to feed pt. Dr. Sloan Leiter made aware.

## 2022-03-03 NOTE — Progress Notes (Signed)
Pt bladder distended. Foley with urine in the tubing. Balloon deflated and advanced; urine begin to flow and distention relieved and pt voiced relief. 1100 noted in bag

## 2022-03-03 NOTE — Progress Notes (Signed)
Speech Language Pathology Treatment: Dysphagia  Patient Details Name: Kenneth Woods MRN: [id number] DOB: 03/04/1934 Today's Date: 03/03/2022 Time: 6384-6659 SLP Time Calculation (min) (ACUTE ONLY): 19 min  Assessment / Plan / Recommendation Clinical Impression  Pt was seen for dysphagia treatment. He was alert and cooperative during the session. Pt's breakfast tray was present and boluses of scrambled eggs and potatoes given. Pt demonstrated reduced bolus awareness with prolonged mastication which continued beyond the point of necessity. With cues, pt did not swallow these boluses and a liquid wash was ineffective. These boluses were ultimately removed from the oral cavity via suction. Pt tolerated puree solids and thin liquids with only mildly prolonged bolus manipulation and prompts were not necessary for swallowing. No s/sx of aspiration were noted with solids or liquids. Pt's diet will be modified to dysphagia 1 and thin liquids. SLP will continue to follow pt.     HPI HPI: Pt is an 88 y.o. male who presented 5/14 with AMS. CXR on admission: Linear densities in the left lower lung fields may suggest crowding of markings due to poor inspiration or subsegmental atelectasis. MRI brain 5/16 and EEG negative. Dx acute metabolic encephalopathy. PMT GOC meeting 5/17: pt's son will speak with family, but would like full scope at this time. Cortrak placed 5/16. Pt battling orchitis and epididymitis for past month. PMH: BPH, HTN, COPD.      SLP Plan  Continue with current plan of care      Recommendations for follow up therapy are one component of a multi-disciplinary discharge planning process, led by the attending physician.  Recommendations may be updated based on patient status, additional functional criteria and insurance authorization.    Recommendations  Diet recommendations: Dysphagia 1 (puree);Thin liquid Liquids provided via: Cup;Straw Medication Administration: Via alternative means  (or crushed with puree) Supervision: Trained caregiver to feed patient Compensations: Slow rate;Small sips/bites;Follow solids with liquid Postural Changes and/or Swallow Maneuvers: Seated upright 90 degrees;Upright 30-60 min after meal                Oral Care Recommendations: Oral care BID Follow Up Recommendations:  (TBD) Assistance recommended at discharge: Frequent or constant Supervision/Assistance SLP Visit Diagnosis: Dysphagia, unspecified (R13.10) Plan: Continue with current plan of care         Kenneth Woods I. Vear Clock, MS, CCC-SLP Acute Rehabilitation Services Office number [phone] Pager [phone]  Kenneth Woods  03/03/2022, 9:52 AM

## 2022-03-03 NOTE — Progress Notes (Signed)
Daily Progress Note   Patient Name: Kenneth Woods       Date: 03/03/2022 DOB: 1934/08/31  Age: 88 y.o. MRN#: [id number] Attending Physician: Maretta Bees, MD Primary Care Physician: No primary care provider on file. Admit Date: 02/26/2022  Reason for Consultation/Follow-up: Establishing goals of care  Subjective: Son at bedside -again seems more alert today than yesterday -tells me he feels "alright" -does not follow commands -remains in restraints with feeding tube -Per SLP note tolerated dysphagia 1 diet  Length of Stay: 5  Current Medications: Scheduled Meds:   Chlorhexidine Gluconate Cloth  6 each Topical Daily   feeding supplement (PROSource TF)  45 mL Per Tube BID   finasteride  5 mg Oral Daily   free water  150 mL Per Tube Q4H   tamsulosin  0.4 mg Oral Daily    Continuous Infusions:  cefTRIAXone (ROCEPHIN)  IV Stopped (03/03/22 1143)   doxycycline (VIBRAMYCIN) IV Stopped (03/03/22 1039)   feeding supplement (JEVITY 1.5 CAL/FIBER) 1,000 mL (03/03/22 0400)   thiamine injection Stopped (03/03/22 0700)    PRN Meds: albuterol, LORazepam, ondansetron **OR** ondansetron (ZOFRAN) IV  Physical Exam Constitutional:      General: He is not in acute distress.    Appearance: He is ill-appearing.  Pulmonary:     Effort: Pulmonary effort is normal.  Skin:    General: Skin is warm and dry.  Neurological:     Mental Status: He is alert. He is disoriented.  Psychiatric:     Comments: Slightly agitated, restless            Vital Signs: BP (!) 120/55 (BP Location: Left Arm)   Pulse (!) 52   Temp 97.6 F (36.4 C) (Axillary)   Resp 20   Ht 5\' 5"  (1.651 m)   Wt 67.8 kg   SpO2 94%   BMI 24.87 kg/m  SpO2: SpO2: 94 % O2 Device: O2 Device: Room Air O2 Flow Rate: O2 Flow Rate (L/min):  1 L/min  Intake/output summary:  Intake/Output Summary (Last 24 hours) at 03/03/2022 1350 Last data filed at 03/03/2022 1239 Gross per 24 hour  Intake 3104.05 ml  Output 1500 ml  Net 1604.05 ml    LBM:   Baseline Weight: Weight: 68 kg Most recent weight: Weight: 67.8 kg       Palliative Assessment/Data: PPS 20%    Flowsheet Rows    Flowsheet Row Most Recent Value  Intake Tab   Referral Department Hospitalist  Unit at Time of Referral Intermediate Care Unit  Palliative Care Primary Diagnosis Sepsis/Infectious Disease  Date Notified 02/28/22  Palliative Care Type New Palliative care  Reason for referral Clarify Goals of Care  Date of Admission 02/26/22  Date first seen by Palliative Care 03/01/22  # of days Palliative referral response time 1 Day(s)  # of days IP prior to Palliative referral 2  Clinical Assessment   Psychosocial & Spiritual Assessment   Palliative Care Outcomes        Patient Active Problem List   Diagnosis Date Noted   Protein-calorie malnutrition, severe 03/01/2022   Thrombocytopenia (HCC) 02/27/2022   Abdominal pain 02/27/2022   Bilateral calf pain 02/27/2022   Orchitis, epididymitis,  and epididymo-orchitis 02/27/2022   Acute encephalopathy 02/26/2022   COPD exacerbation (HCC) 12/13/2015   Elevated troponin 12/13/2015   Hypokalemia 12/13/2015   Retention of urine    Hyperlipidemia    Hypertension    COPD (chronic obstructive pulmonary disease) (HCC)    Arthritis    BPH with elevated PSA    Elevated PSA 10/16/2010    Palliative Care Assessment & Plan   HPI: 88 y.o. male  with past medical history of BPH, HTN, and COPD admitted on 02/26/2022 with AMS. Pt has been battling with orchitis and epididymitis for past month. Most recently pt seen in ED on 5/4.  Epididymitis and orchitis was worsening as demonstrated on [place] then despite ABx treatment as outpt. This admission, patient's acute encephalopathy continues with unclear etiology.  Neuroimaging negative for acute abnormalities and EEG negative for seizures. Poor PO intake d/t mental status - pursuing Cortrak placement 5/17.  Also with concern about DIC.  Patient continues on antibiotics given concern for possible infectious etiology.  MRI pelvis negative for any acute process.  PMT consulted to discuss goals of care.  Assessment: Son at bedside today.  We reviewed patient's clinical course.  Son is very pleased with patient's improvement in mental status.  Son continues to focus conversation on patient's need for vitamins.  We discussed patient is receiving nutrition through his feeding tube.  Son tells me of his plans for nutritional intake once patient is able to DC home.  Also attempted to review goals of care, advance care planning.  Son tells me he has not discussed this with other family members yet and is not sure.  He also tells me he is hopeful that patient becomes more oriented and can participate in conversation himself.  Recommendations/Plan: Continue current course, allow time for outcomes Attempted more goals of care/advance care planning conversation with son however son not interested in any decision making, wants to discuss this with other family members and is hopeful patient can become more involved in conversations PMT will follow chart   Code Status: Full code   Care plan was discussed with son  Thank you for allowing the Palliative Medicine Team to assist in the care of this patient.   *Please note that this is a verbal dictation therefore any spelling or grammatical errors are due to the "Dragon Medical One" system interpretation.  Gerlean Ren, DNP, [facility] Palliative Medicine Team Team Phone # [phone]  Pager [phone]

## 2022-03-03 NOTE — Progress Notes (Signed)
PROGRESS NOTE        PATIENT DETAILS Name: Kenneth Woods Age: 88 y.o. Sex: male Date of Birth: [birth year] Admit Date: 02/26/2022 Admitting Physician Ollen Bowl, MD PCP:No primary care provider on file.  Brief Summary: Patient is a 88 y.o.  male with history of BPH, recent history of right epididymitis-has completed a course of Levaquin-most recently placed on Bactrim on 5/4 ED visit-presented to the hospital with altered mental status with possible DIC-subsequently admitted to the hospitalist service for further evaluation and treatment.  Significant events: 5/14>> admit to [facility] for AMS/leukopenia/thrombocytopenia.  Acute urinary retention-requiring Foley catheter insertion. 5/17>>Cortak tube inserted-tube feeds started 5/18>> encephalopathy improving-more awake and more alert.  Significant studies: 5/14>> CT head: No acute findings. 5/14>> CT abdomen/pelvis: No acute findings-marked prostate hypertrophy/chronic bladder outlet obstruction. 5/14>> x-ray left knee: No fracture/dislocation 5/14>> x-ray hip/pelvis: No fracture/dislocation 5/14>> CXR: No pneumonia 5/14>>NH4: normal 5/15>>B12 level:normal 5/15>>B1 level: 81 (low normal) 5/15>> ultrasound scrotum: No torsion-stable complex right hydrocele with mass effect on the right testes. 5/15>>EEG:no seizures 5/15>> lower extremity Doppler: No DVT. 5/16>> MRI pelvis: No findings suspicious of periurethral abscess. 5/16>> MRI brain: No acute infarct.  Significant microbiology data: 5/14>> blood culture: No growth 5/14>> urine culture: No growth 5/15>> COVID/influenza PCR: Negative 5/15>> hydrocele culture: No growth  Procedures: 5/15>> bedside aspiration from right hydrocele by urology  Consults: Urology, hematology, neurology, palliative care, infectious disease  Subjective: Was slightly delirious this morning-but this afternoon-he is completely awake-speaking and answering most of my  questions appropriately.  Nursing tech in the room trying to feed him lunch.  He was confused this morning and was not able to eat breakfast.  Objective: Vitals: Blood pressure (!) 120/55, pulse (!) 52, temperature 97.6 F (36.4 C), temperature source Axillary, resp. rate 20, height  (1.651 m), weight 67.8 kg, SpO2 94 %.   Exam: Gen Exam: Much more awake and alert this afternoon-not in any distress HEENT:atraumatic, normocephalic Chest: B/L clear to auscultation anteriorly CVS:S1S2 regular Abdomen:soft non tender, non distended Extremities:no edema Neurology: Non focal Skin: no rash   Pertinent Labs/Radiology:    Latest Ref Rng & Units 03/03/2022    5:39 AM 03/02/2022    5:17 AM 03/01/2022    1:25 AM  CBC  WBC 4.0 - 10.5 K/uL 8.2   13.3   7.6    Hemoglobin 13.0 - 17.0 g/dL 40.9   81.1   91.4    Hematocrit 39.0 - 52.0 % 38.5   38.2   40.2    Platelets 150 - 400 K/uL 128   104   74     73      Lab Results  Component Value Date   NA 140 03/03/2022   K 3.9 03/03/2022   CL 108 03/03/2022   CO2 28 03/03/2022       Assessment/Plan: Acute metabolic encephalopathy: Etiology unclear-neuroimaging negative for significant abnormalities-EEG negative for seizures.  Although felt unlikely to have Wernicke's encephalopathy-remains on high-dose IV thiamine (person used to drink heavily till last year-now a social drinker).  Encephalopathy gradually improving-he at times has waxing and waning delirium-he is much more awake and alert this afternoon.  Continue supportive care.    Pancytopenia-mild coagulopathy-possible DIC: Unclear etiology-probable lingering epididymitis may have caused DIC.  Counts have improved-hematology has signed off.    Recurrent right epididymitis-reactive hydrocele: Had  completed 2 courses of fluoroquinolone in the outpatient setting-Bactrim started on 5/4 on ED visit.  Clinically does not have any significant epididymitis on exam.  Has a reactive hydrocele-that  was aspirated at bedside by urology-cultures negative as well.  Remains on empiric Rocephin (end date 5/20) and doxycycline (end date 5/22) per ID  Note-sepsis ruled out.  AKI: Mild-resolved-probably hemodynamic related kidney injury and from acute urinary retention.  Acute urinary retention: S/p Foley catheter inserted in the ED-remains on finasteride/Flomax-discussed with urology-Dr. Benancio Deeds on 5/17-continue Foley catheter on discharge-patient has a very large prostate-urology will address Foley catheter upon follow-up in the office.    Dysphagia: Due to severe encephalopathy-cortrak tube in place-NG feedings ongoing-since encephalopathy improved-if he tolerates dysphagia 1 diet well today-suspect we could discontinue the NG tube later today.    BPH: Continue finasteride/Flomax  Bilateral calf pain: Dopplers negative.  HTN: BP stable without the use of any antihypertensives-follow-up.  3.4 cm abdominal aortic aneurysm: Defer further to the outpatient setting  Debility/deconditioning: Now that encephalopathy has improved-we will obtain PT/OT.  Palliative care: Full code-continue full scope of treatment-palliative care following.  Nutrition Status: Nutrition Problem: Severe Malnutrition Etiology: chronic illness (COPD) Signs/Symptoms: severe muscle depletion, severe fat depletion Interventions: Tube feeding, MVI, Prostat    BMI: Estimated body mass index is 24.87 kg/m as calculated from the following:   Height as of this encounter:  (1.651 m).   Weight as of this encounter: 67.8 kg.   Code status:   Code Status: Full Code   DVT Prophylaxis: SCDs Start: 02/27/22 0102   Family Communication: [name] [phone]-updated over the phone 5/15, updated son Onalee Hua at bedside on 5/18   Disposition Plan: Status is: Inpatient Remains inpatient appropriate because: Severe encephalopathy-not yet at baseline-we will require several days of hospitalization.   Planned  Discharge Destination:Skilled nursing facility vs SNF   Diet: Diet Order             DIET - DYS 1 Room service appropriate? Yes with Assist; Fluid consistency: Thin  Diet effective now                     Antimicrobial agents: Anti-infectives (From admission, onward)    Start     Dose/Rate Route Frequency Ordered Stop   02/28/22 1400  doxycycline (VIBRAMYCIN) 100 mg in sodium chloride 0.9 % 250 mL IVPB        100 mg 125 mL/hr over 120 Minutes Intravenous Every 12 hours 02/28/22 1314 03/07/22 0959   02/28/22 1230  doxycycline (VIBRA-TABS) tablet 100 mg  Status:  Discontinued        100 mg Oral Every 12 hours 02/28/22 1133 02/28/22 1314   02/27/22 1700  vancomycin (VANCOREADY) IVPB 750 mg/150 mL  Status:  Discontinued        750 mg 150 mL/hr over 60 Minutes Intravenous Every 24 hours 02/26/22 1638 02/27/22 1018   02/27/22 1115  cefTRIAXone (ROCEPHIN) 2 g in sodium chloride 0.9 % 100 mL IVPB        2 g 200 mL/hr over 30 Minutes Intravenous Every 24 hours 02/27/22 1021 03/04/22 2359   02/26/22 2200  ceFEPIme (MAXIPIME) 2 g in sodium chloride 0.9 % 100 mL IVPB  Status:  Discontinued        2 g 200 mL/hr over 30 Minutes Intravenous Every 12 hours 02/26/22 1618 02/27/22 1018   02/26/22 1615  vancomycin (VANCOCIN) IVPB 1000 mg/200 mL premix  1,000 mg 200 mL/hr over 60 Minutes Intravenous  Once 02/26/22 1601 02/26/22 1811        MEDICATIONS: Scheduled Meds:  Chlorhexidine Gluconate Cloth  6 each Topical Daily   feeding supplement (PROSource TF)  45 mL Per Tube BID   finasteride  5 mg Oral Daily   free water  150 mL Per Tube Q4H   tamsulosin  0.4 mg Oral Daily   Continuous Infusions:  cefTRIAXone (ROCEPHIN)  IV Stopped (03/03/22 1143)   doxycycline (VIBRAMYCIN) IV Stopped (03/03/22 1039)   feeding supplement (JEVITY 1.5 CAL/FIBER) 1,000 mL (03/03/22 0400)   thiamine injection Stopped (03/03/22 0700)   PRN Meds:.albuterol, LORazepam, ondansetron **OR** ondansetron  (ZOFRAN) IV   I have personally reviewed following labs and imaging studies  LABORATORY DATA: CBC: Recent Labs  Lab 02/26/22 1401 02/26/22 1503 02/27/22 0408 02/28/22 0120 03/01/22 0125 03/02/22 0517 03/02/22 0709 03/03/22 0539  WBC 3.2*  --  3.0* 3.4* 7.6 13.3*  --  8.2  NEUTROABS 2.4  --   --   --  1.4*  --  1.4*  --   HGB 13.9   < > 11.6* 11.4* 13.0 12.5*  --  13.1  HCT 42.3   < > 34.1* 35.4* 40.2 38.2*  --  38.5*  MCV 87.0  --  86.8 87.8 88.5 87.0  --  85.4  PLT 42*   < > 39* 54*  52* 73*  74* 104*  --  128*   < > = values in this interval not displayed.     Basic Metabolic Panel: Recent Labs  Lab 02/26/22 1207 02/26/22 1503 02/27/22 0123 02/28/22 0120 03/01/22 0125 03/01/22 1647 03/02/22 0517 03/02/22 1659 03/03/22 0539  NA 135   < > 134* 138 143  --  142  --  140  K 4.4   < > 4.4 3.9 4.1  --  3.9  --  3.9  CL 98  --  107 111 116*  --  114*  --  108  CO2 30  --  20* 21* 21*  --  25  --  28  GLUCOSE 141*  --  97 90 78  --  119*  --  177*  BUN 43*  --  35* 28* 27*  --  24*  --  19  CREATININE 1.33*  --  0.97 1.04 0.98  --  0.81  --  0.70  CALCIUM 9.4  --  8.5* 8.3* 8.8*  --  8.6*  --  8.6*  MG 2.9*  --   --   --   --  2.2 2.1 2.0 2.0  PHOS  --   --   --   --   --  1.9* 1.9* 2.9 2.7   < > = values in this interval not displayed.     GFR: Estimated Creatinine Clearance: 57.7 mL/min (by C-G formula based on SCr of 0.7 mg/dL).  Liver Function Tests: Recent Labs  Lab 02/27/22 0123 02/28/22 0120 03/01/22 0125 03/02/22 0517 03/03/22 0539  AST 47* 37 40 39 32  ALT 32 26 25 24 19   ALKPHOS 57 47 47 44 56  BILITOT 0.5 0.3 0.2* 0.6 0.5  PROT 5.2* 4.5* 4.7* 4.6* 4.7*  ALBUMIN 2.4* 2.0* 2.0* 2.0* 2.1*    No results for input(s): LIPASE, AMYLASE in the last 168 hours. Recent Labs  Lab 02/26/22 1401  AMMONIA 33     Coagulation Profile: Recent Labs  Lab 02/26/22 1401 02/26/22 2314 02/28/22 0120 03/01/22  0125  INR 1.1 1.2 1.4* 1.7*      Cardiac Enzymes: Recent Labs  Lab 02/27/22 0123  CKTOTAL 103     BNP (last 3 results) No results for input(s): PROBNP in the last 8760 hours.  Lipid Profile: No results for input(s): CHOL, HDL, LDLCALC, TRIG, CHOLHDL, LDLDIRECT in the last 72 hours.  Thyroid Function Tests: No results for input(s): TSH, T4TOTAL, FREET4, T3FREE, THYROIDAB in the last 72 hours.   Anemia Panel: No results for input(s): VITAMINB12, FOLATE, FERRITIN, TIBC, IRON, RETICCTPCT in the last 72 hours.   Urine analysis:    Component Value Date/Time   COLORURINE YELLOW 02/26/2022 1401   APPEARANCEUR CLEAR 02/26/2022 1401   LABSPEC 1.022 02/26/2022 1401   PHURINE 5.5 02/26/2022 1401   GLUCOSEU NEGATIVE 02/26/2022 1401   HGBUR MODERATE (A) 02/26/2022 1401   BILIRUBINUR NEGATIVE 02/26/2022 1401   KETONESUR NEGATIVE 02/26/2022 1401   PROTEINUR 30 (A) 02/26/2022 1401   UROBILINOGEN 0.2 06/20/2013 2028   NITRITE NEGATIVE 02/26/2022 1401   LEUKOCYTESUR NEGATIVE 02/26/2022 1401    Sepsis Labs: Lactic Acid, Venous    Component Value Date/Time   LATICACIDVEN 1.7 02/27/2022 0408    MICROBIOLOGY: Recent Results (from the past 240 hour(s))  Urine Culture     Status: None   Collection Time: 02/26/22  2:01 PM   Specimen: In/Out Cath Urine  Result Value Ref Range Status   Specimen Description   Final    IN/OUT CATH URINE Performed at [facility], [address], [place], [place] [address]    Special Requests   Final    NONE Performed at [facility], [address] [place], [place] [address]    Culture   Final    NO GROWTH Performed at [facility], [address], [place], [place] [address]    Report Status 02/28/2022 FINAL  Final  Culture, blood (Routine X 2) w Reflex to ID Panel     Status: None   Collection Time: 02/26/22  4:05 PM   Specimen: BLOOD LEFT FOREARM  Result Value Ref Range Status   Specimen Description   Final    BLOOD  LEFT FOREARM Performed at [facility], [address], [place], [place] [address]    Special Requests   Final    BOTTLES DRAWN AEROBIC AND ANAEROBIC Blood Culture adequate volume Performed at [facility], [address], [place], [place] [address]    Culture   Final    NO GROWTH 5 DAYS Performed at [facility], [address] [place], [place] [address]    Report Status 03/03/2022 FINAL  Final  Culture, blood (Routine X 2) w Reflex to ID Panel     Status: None   Collection Time: 02/26/22  4:20 PM   Specimen: BLOOD LEFT WRIST  Result Value Ref Range Status   Specimen Description   Final    BLOOD LEFT WRIST Performed at [facility], [address], [place], [place] [address]    Special Requests   Final    BOTTLES DRAWN AEROBIC AND ANAEROBIC Blood Culture results may not be optimal due to an inadequate volume of blood received in culture bottles Performed at [facility], [address] [place], [place] [address]    Culture   Final    NO GROWTH 5 DAYS Performed at [facility], [address] [place], [place] [address]    Report Status 03/03/2022 FINAL  Final  Resp  Panel by RT-PCR (Flu A&B, Covid) Nasopharyngeal Swab     Status: None   Collection Time: 02/27/22  1:01 AM   Specimen: Nasopharyngeal Swab; Nasopharyngeal(NP) swabs in vial transport medium  Result Value Ref Range Status   SARS Coronavirus 2 by RT PCR NEGATIVE NEGATIVE Final    Comment: (NOTE) SARS-CoV-2 target nucleic acids are NOT DETECTED.  The SARS-CoV-2 RNA is generally detectable in upper respiratory specimens during the acute phase of infection. The lowest concentration of SARS-CoV-2 viral copies this assay can detect is 138 copies/mL. A negative result does not preclude SARS-Cov-2 infection and should not be used as the sole basis for treatment or other patient management decisions. A negative result may occur  with  improper specimen collection/handling, submission of specimen other than nasopharyngeal swab, presence of viral mutation(s) within the areas targeted by this assay, and inadequate number of viral copies(<138 copies/mL). A negative result must be combined with clinical observations, patient history, and epidemiological information. The expected result is Negative.  Fact Sheet for Patients:  BloggerCourse.comhttps://www.fda.gov/media/152166/download  Fact Sheet for Healthcare Providers:  SeriousBroker.ithttps://www.fda.gov/media/152162/download  This test is no t yet approved or cleared by the [place] FDA and  has been authorized for detection and/or diagnosis of SARS-CoV-2 by FDA under an Emergency Use Authorization (EUA). This EUA will remain  in effect (meaning this test can be used) for the duration of the COVID-19 declaration under Section 564(b)(1) of the Act, 21 U.S.C.section 360bbb-3(b)(1), unless the authorization is terminated  or revoked sooner.       Influenza A by PCR NEGATIVE NEGATIVE Final   Influenza B by PCR NEGATIVE NEGATIVE Final    Comment: (NOTE) The Xpert Xpress SARS-CoV-2/FLU/RSV plus assay is intended as an aid in the diagnosis of influenza from Nasopharyngeal swab specimens and should not be used as a sole basis for treatment. Nasal washings and aspirates are unacceptable for Xpert Xpress SARS-CoV-2/FLU/RSV testing.  Fact Sheet for Patients: BloggerCourse.comhttps://www.fda.gov/media/152166/download  Fact Sheet for Healthcare Providers: SeriousBroker.ithttps://www.fda.gov/media/152162/download  This test is not yet approved or cleared by the [place] FDA and has been authorized for detection and/or diagnosis of SARS-CoV-2 by FDA under an Emergency Use Authorization (EUA). This EUA will remain in effect (meaning this test can be used) for the duration of the COVID-19 declaration under Section 564(b)(1) of the Act, 21 U.S.C. section 360bbb-3(b)(1), unless the authorization is terminated  or revoked.  Performed at [facility], [address] [place], [place] [address]   MRSA Next Gen by PCR, Nasal     Status: None   Collection Time: 02/27/22  4:03 AM   Specimen: Nasal Mucosa; Nasal Swab  Result Value Ref Range Status   MRSA by PCR Next Gen NOT DETECTED NOT DETECTED Final    Comment: (NOTE) The GeneXpert MRSA Assay (FDA approved for NASAL specimens only), is one component of a comprehensive MRSA colonization surveillance program. It is not intended to diagnose MRSA infection nor to guide or monitor treatment for MRSA infections. Test performance is not FDA approved in patients less than 86 years old. Performed at [facility], [address] [place], [place] [address]   Body fluid culture w Gram Stain     Status: None   Collection Time: 02/27/22  7:36 AM   Specimen: Body Fluid  Result Value Ref Range Status   Specimen Description FLUID  Final   Special Requests HYDROCELE SAC  Final   Gram Stain NO WBC SEEN NO ORGANISMS SEEN   Final  Culture   Final    NO GROWTH 3 DAYS Performed at [facility], [address] [place], [place] [address]    Report Status 03/02/2022 FINAL  Final    RADIOLOGY STUDIES/RESULTS: DG Abd Portable 1V  Result Date: 03/01/2022 CLINICAL DATA:  Feeding tube placement. EXAM: PORTABLE ABDOMEN - 1 VIEW COMPARISON:  CT exam on 02/26/2022 FINDINGS: Interval placement of feeding tube, tip overlying the level of the distal stomach or proximal duodenum. LEFT pleural effusion suspected. Visualized bowel gas pattern is nonobstructive. No evidence for free intraperitoneal air. IMPRESSION: Feeding tube tip overlies the distal stomach/duodenum. Electronically Signed   By: Norva Pavlov M.D.   On: 03/01/2022 16:15     LOS: 5 days   Jeoffrey Massed, MD  [facility]    To contact the attending provider between 7A-7P or the covering provider during after hours 7P-7A, please log into the web site [email or web address] and  access using universal [place] password for that web site. If you do not have the password, please call the hospital operator.  03/03/2022, 2:18 PM

## 2022-03-04 DIAGNOSIS — G9340 Encephalopathy, unspecified: Secondary | ICD-10-CM | POA: Diagnosis not present

## 2022-03-04 LAB — CBC
HCT: 37.5 % — ABNORMAL LOW (ref 39.0–52.0)
Hemoglobin: 12.2 g/dL — ABNORMAL LOW (ref 13.0–17.0)
MCH: 28.2 pg (ref 26.0–34.0)
MCHC: 32.5 g/dL (ref 30.0–36.0)
MCV: 86.6 fL (ref 80.0–100.0)
Platelets: 136 10*3/uL — ABNORMAL LOW (ref 150–400)
RBC: 4.33 MIL/uL (ref 4.22–5.81)
RDW: 14.8 % (ref 11.5–15.5)
WBC: 5.4 10*3/uL (ref 4.0–10.5)
nRBC: 0 % (ref 0.0–0.2)

## 2022-03-04 LAB — GLUCOSE, CAPILLARY
Glucose-Capillary: 105 mg/dL — ABNORMAL HIGH (ref 70–99)
Glucose-Capillary: 107 mg/dL — ABNORMAL HIGH (ref 70–99)
Glucose-Capillary: 109 mg/dL — ABNORMAL HIGH (ref 70–99)
Glucose-Capillary: 112 mg/dL — ABNORMAL HIGH (ref 70–99)
Glucose-Capillary: 135 mg/dL — ABNORMAL HIGH (ref 70–99)
Glucose-Capillary: 162 mg/dL — ABNORMAL HIGH (ref 70–99)

## 2022-03-04 MED ORDER — LACTATED RINGERS IV SOLN: INTRAVENOUS | Status: AC

## 2022-03-04 MED ORDER — DOXYCYCLINE HYCLATE 100 MG PO TABS
100.0000 mg | ORAL_TABLET | Freq: Two times a day (BID) | ORAL | Status: AC
  Administered 2022-03-04 – 2022-03-06 (×5): 100 mg via ORAL
  Filled 2022-03-04 (×5): qty 1

## 2022-03-04 NOTE — Evaluation (Signed)
Occupational Therapy Evaluation Patient Details Name: Kenneth Woods MRN: [id number] DOB: May 05, 1934 Today's Date: 03/04/2022   History of Present Illness 88 yo male with onset of painful perineum after battling orchitis and epididymitis was admitted 5/14 and now is less painful but still more confused than previously.  Dx with acute encephalitis, ongoing ABT for his testicular infection.  PMHx:  BPH, HTN, COPD, OA, HLD, back pain, R UE fracture,   Clinical Impression   Pt typically lives alone with son next door, does own ADL/IADL and enjoys working on Therapist, art. Walks without DME. Today he is confused (maybe delirious - talking about the cops and court/judges) but pleasant and agreeable with therapy. Pt is mod to max A +2 for sit<>stand from bed, max A +2 to pivot to recliner. He requires hand over hand assist to initiate grooming tasks and overall min to mod for UB ADL and max A for LB ADL.  OT will continue to follow acutely and recommending SNF post-acute to maximize safety and independence in ADL and functional transfers to return to PLOF.      Recommendations for follow up therapy are one component of a multi-disciplinary discharge planning process, led by the attending physician.  Recommendations may be updated based on patient status, additional functional criteria and insurance authorization.   Follow Up Recommendations  Skilled nursing-short term rehab (<3 hours/day)    Assistance Recommended at Discharge Frequent or constant Supervision/Assistance  Patient can return home with the following Two people to help with walking and/or transfers;A lot of help with bathing/dressing/bathroom;Assistance with cooking/housework;Direct supervision/assist for medications management;Direct supervision/assist for financial management;Assist for transportation;Help with stairs or ramp for entrance    Functional Status Assessment  Patient has had a recent decline in their functional status  and demonstrates the ability to make significant improvements in function in a reasonable and predictable amount of time.  Equipment Recommendations  BSC/3in1    Recommendations for Other Services PT consult     Precautions / Restrictions Precautions Precautions: Fall Restrictions Weight Bearing Restrictions: No      Mobility Bed Mobility Overal bed mobility: Needs Assistance Bed Mobility: Supine to Sit, Rolling Rolling: Mod assist   Supine to sit: Mod assist, +2 for physical assistance, +2 for safety/equipment     General bed mobility comments: mod assist, use of bed pad to facilitate. Pt stating "my leg is broken" this is inaccurate - imaging negative    Transfers Overall transfer level: Needs assistance Equipment used: 2 person hand held assist Transfers: Sit to/from Stand, Bed to chair/wheelchair/BSC Sit to Stand: Max assist, +2 physical assistance, +2 safety/equipment Stand pivot transfers: Max assist, +2 physical assistance, +2 safety/equipment   Step pivot transfers: Max assist, +2 physical assistance, +2 safety/equipment     General transfer comment: pt is in flexed posture of hips and knees      Balance Overall balance assessment: Needs assistance Sitting-balance support: Feet supported, Bilateral upper extremity supported (upper body support needed) Sitting balance-Leahy Scale: Poor   Postural control: Posterior lean, Right lateral lean Standing balance support: Bilateral upper extremity supported, During functional activity Standing balance-Leahy Scale: Poor Standing balance comment: dependent on therapist support                           ADL either performed or assessed with clinical judgement   ADL Overall ADL's : Needs assistance/impaired Eating/Feeding: Set up;Sitting   Grooming: Wash/dry face;Moderate assistance;Sitting Grooming Details (indicate cue type and  reason): hand over hand to initiate Upper Body Bathing: Moderate  assistance;Sitting   Lower Body Bathing: Maximal assistance   Upper Body Dressing : Moderate assistance;Sitting   Lower Body Dressing: Maximal assistance   Toilet Transfer: Maximal assistance;+2 for physical assistance;+2 for safety/equipment;Stand-pivot Toilet Transfer Details (indicate cue type and reason): 2 person face to face Toileting- Clothing Manipulation and Hygiene: Maximal assistance       Functional mobility during ADLs: Maximal assistance;+2 for physical assistance;+2 for safety/equipment General ADL Comments: Pt pleasantly confused, thinks he can do more than he can     Vision         Perception     Praxis      Pertinent Vitals/Pain Pain Assessment Pain Assessment: Faces Faces Pain Scale: No hurt Pain Intervention(s): Monitored during session, Repositioned     Hand Dominance Right   Extremity/Trunk Assessment Upper Extremity Assessment Upper Extremity Assessment: Generalized weakness   Lower Extremity Assessment Lower Extremity Assessment: Defer to PT evaluation   Cervical / Trunk Assessment Cervical / Trunk Assessment: Kyphotic   Communication Communication Communication: HOH;Receptive difficulties (may be both issues)   Cognition Arousal/Alertness: Awake/alert Behavior During Therapy: Flat affect Overall Cognitive Status: Impaired/Different from baseline Area of Impairment: Problem solving, Awareness, Safety/judgement, Following commands, Memory, Attention, Orientation                 Orientation Level: Situation, Time Current Attention Level: Selective Memory: Decreased short-term memory Following Commands: Follows one step commands with increased time, Follows one step commands inconsistently Safety/Judgement: Decreased awareness of safety, Decreased awareness of deficits Awareness: Intellectual Problem Solving: Slow processing, Requires verbal cues, Requires tactile cues General Comments: family in at end of session to determine  his PLOF     General Comments  Pt initially on 1L O2 via [place], on RA Pt maintained >90% (typically 93% - did drop to 87% with poor pleth)    Exercises     Shoulder Instructions      Home Living Family/patient expects to be discharged to:: Unsure Living Arrangements: Alone Available Help at Discharge: Family;Available PRN/intermittently Type of Home: House Home Access: Stairs to enter [employer] of Steps: 2 Entrance Stairs-Rails: Can reach both Home Layout: One level     Bathroom Shower/Tub: Chief Strategy Officer: Standard     Home Equipment: None   Additional Comments: per son the other son is next door and drops in to help      Prior Functioning/Environment Prior Level of Function : Independent/Modified Independent             Mobility Comments: no AD used or in the home ADLs Comments: per family was independent        OT Problem List: Decreased strength;Decreased activity tolerance;Impaired balance (sitting and/or standing);Decreased cognition      OT Treatment/Interventions: Self-care/ADL training;DME and/or AE instruction;Therapeutic activities;Patient/family education;Balance training    OT Goals(Current goals can be found in the care plan section) Acute Rehab OT Goals Patient Stated Goal: sit up in the chair OT Goal Formulation: With patient/family Time For Goal Achievement: 03/18/22 Potential to Achieve Goals: Good ADL Goals Pt Will Perform Grooming: with modified independence;sitting Pt Will Perform Upper Body Dressing: with supervision;sitting Pt Will Perform Lower Body Dressing: with min assist;sit to/from stand Pt Will Transfer to Toilet: with mod assist;ambulating Pt Will Perform Toileting - Clothing Manipulation and hygiene: with mod assist;sit to/from stand  OT Frequency: Min 2X/week    Co-evaluation PT/OT/SLP Co-Evaluation/Treatment: Yes Reason for Co-Treatment: Necessary to  address cognition/behavior during  functional activity;For patient/therapist safety;To address functional/ADL transfers PT goals addressed during session: Mobility/safety with mobility;Balance;Strengthening/ROM OT goals addressed during session: ADL's and self-care;Strengthening/ROM      AM-PAC OT "6 Clicks" Daily Activity     Outcome Measure Help from another person eating meals?: A Little Help from another person taking care of personal grooming?: A Little Help from another person toileting, which includes using toliet, bedpan, or urinal?: A Lot Help from another person bathing (including washing, rinsing, drying)?: A Lot Help from another person to put on and taking off regular upper body clothing?: A Lot Help from another person to put on and taking off regular lower body clothing?: A Lot 6 Click Score: 14   End of Session Equipment Utilized During Treatment: Gait belt Nurse Communication: Mobility status;Precautions (on RA due to SpO2 >90%)  Activity Tolerance: Patient tolerated treatment well Patient left: in chair;with call bell/phone within reach;with chair alarm set  OT Visit Diagnosis: Unsteadiness on feet (R26.81);Muscle weakness (generalized) (M62.81);Other abnormalities of gait and mobility (R26.89);Other symptoms and signs involving cognitive function                Time: 1135-1210 OT Time Calculation (min): 35 min Charges:  OT General Charges $OT Visit: 1 Visit OT Evaluation $OT Eval Moderate Complexity: 1 Mod  Nyoka CowdenLaura H OTR/L Acute Rehabilitation Services Pager: [phone] Office: [phone]   Evern BioLaura J Leary Mcnulty 03/04/2022, 4:07 PM

## 2022-03-04 NOTE — NC FL2 (Signed)
[place] MEDICAID FL2 LEVEL OF CARE SCREENING TOOL     IDENTIFICATION  Patient Name: ANIKET PAYE Birthdate: [birth year] Sex: male Admission Date (Current Location): 02/26/2022  [facility] and [place] Number:  Producer, television/film/video and Address:  [facility], [address], [place], [place] [address]      Provider Number: [id number]  Attending Physician Name and Address:  Leroy Sea, MD  Relative Name and Phone Number:  Suheyb, Raucci [phone]  [phone]    Current Level of Care: Hospital Recommended Level of Care: Skilled Nursing Facility Prior Approval Number:    Date Approved/Denied:   PASRR Number: [id number]  Discharge Plan: SNF    Current Diagnoses: Patient Active Problem List   Diagnosis Date Noted   Protein-calorie malnutrition, severe 03/01/2022   Thrombocytopenia (HCC) 02/27/2022   Abdominal pain 02/27/2022   Bilateral calf pain 02/27/2022   Orchitis, epididymitis, and epididymo-orchitis 02/27/2022   Acute encephalopathy 02/26/2022   COPD exacerbation (HCC) 12/13/2015   Elevated troponin 12/13/2015   Hypokalemia 12/13/2015   Retention of urine    Hyperlipidemia    Hypertension    COPD (chronic obstructive pulmonary disease) (HCC)    Arthritis    BPH with elevated PSA    Elevated PSA 10/16/2010    Orientation RESPIRATION BLADDER Height & Weight     Self  O2 Continent, Indwelling catheter Weight: 147 lb 11.3 oz (67 kg) Height:  5\' 5"  (165.1 cm)  BEHAVIORAL SYMPTOMS/MOOD NEUROLOGICAL BOWEL NUTRITION STATUS      Incontinent Diet (see discharge summary)  AMBULATORY STATUS COMMUNICATION OF NEEDS Skin   Limited Assist Verbally Other (Comment) (redness)                       Personal Care Assistance Level of Assistance  Bathing, Feeding, Dressing Bathing Assistance: Limited assistance Feeding assistance: Limited assistance Dressing Assistance: Limited assistance     Functional Limitations Info   Sight, Hearing, Speech Sight Info: Adequate Hearing Info: Adequate Speech Info: Adequate    SPECIAL CARE FACTORS FREQUENCY  PT (By licensed PT), OT (By licensed OT)     PT Frequency: 5x week OT Frequency: 5x week            Contractures Contractures Info: Not present    Additional Factors Info  Code Status, Allergies Code Status Info: full Allergies Info: penicillins           Current Medications (03/04/2022):  This is the current hospital active medication list Current Facility-Administered Medications  Medication Dose Route Frequency Provider Last Rate Last Admin   acetaminophen (TYLENOL) tablet 650 mg  650 mg Oral Q6H PRN 03/06/2022 N, DO   650 mg at 03/03/22 1953   albuterol (VENTOLIN HFA) 108 (90 Base) MCG/ACT inhaler 2 puff  2 puff Inhalation Q6H PRN Loeffler, Grace C, PA-C       cefTRIAXone (ROCEPHIN) 2 g in sodium chloride 0.9 % 100 mL IVPB  2 g Intravenous Q24H Vu, 03/05/22, MD   Stopped at 03/03/22 1143   Chlorhexidine Gluconate Cloth 2 % PADS 6 each  6 each Topical Daily 03/05/22, MD   6 each at 03/04/22 03/06/22   doxycycline (VIBRA-TABS) tablet 100 mg  100 mg Oral Q12H 4132, MD       finasteride (PROSCAR) tablet 5 mg  5 mg Oral Daily Leroy Sea, MD   5 mg at 03/04/22 0919   lactated ringers infusion  Intravenous Continuous Leroy Sea, MD       LORazepam (ATIVAN) injection 0.5 mg  0.5 mg Intravenous Once PRN John Giovanni, MD       ondansetron [facility]) tablet 4 mg  4 mg Oral Q6H PRN Hillary Bow, DO       Or   ondansetron [facility]) injection 4 mg  4 mg Intravenous Q6H PRN Hillary Bow, DO   4 mg at 03/03/22 1949   tamsulosin (FLOMAX) capsule 0.4 mg  0.4 mg Oral Daily Maretta Bees, MD   0.4 mg at 03/04/22 0919     Discharge Medications: Please see discharge summary for a list of discharge medications.  Relevant Imaging Results:  Relevant Lab Results:   Additional Information SSN: [id number]. Pt is  not vaccinated for covid.  Lorri Frederick, LCSW

## 2022-03-04 NOTE — TOC Initial Note (Signed)
Transition of Care [facility]) - Initial/Assessment Note    Patient Details  Name: Kenneth Woods MRN: [id number] Date of Birth: 1934/01/04  [facility]) CM/SW Contact:    Lorri Frederick, LCSW Phone Number: 03/04/2022, 2:41 PM  Clinical Narrative:     Pt oriented x1.  Pt son [name] in room, discussed    PT recommendation for SNF.  Bobby agreeable, choice document given, he requests referral be sent to [facility] and [employer].  Pt lives alone with his other son, Onalee Hua, living next door.  No services.  Pt is not vaccinated for covid.           Expected Discharge Plan: Skilled Nursing Facility Barriers to Discharge: Continued Medical Work up, SNF Pending bed offer   Patient Goals and CMS Choice   CMS Medicare.gov Compare Post Acute Care list provided to:: Patient Represenative (must comment) Choice offered to / list presented to : Adult Children (son Molly Maduro)  Expected Discharge Plan and Services Expected Discharge Plan: Skilled Nursing Facility In-house Referral: Clinical Social Work   Post Acute Care Choice: Skilled Nursing Facility Living arrangements for the past 2 months: Single Family Home                                      Prior Living Arrangements/Services Living arrangements for the past 2 months: Single Family Home Lives with:: Self Patient language and need for interpreter reviewed:: Yes        Need for Family Participation in Patient Care: Yes (Comment) Care giver support system in place?: Yes (comment) Current home services: Other (comment) (none) Criminal Activity/Legal Involvement Pertinent to Current Situation/Hospitalization: No - Comment as needed  Activities of Daily Living      Permission Sought/Granted                  Emotional Assessment Appearance:: Appears stated age Attitude/Demeanor/Rapport: Unable to Assess Affect (typically observed): Unable to Assess Orientation: : Oriented to Self Alcohol /  Substance Use: Not Applicable Psych Involvement: No (comment)  Admission diagnosis:  Thrombocytopenia (HCC) [D69.6] Encephalopathy acute [G93.40] AKI (acute kidney injury) (HCC) [N17.9] Leukopenia, unspecified type [D72.819] AMS (altered mental status) [R41.82] Patient Active Problem List   Diagnosis Date Noted   Protein-calorie malnutrition, severe 03/01/2022   Thrombocytopenia (HCC) 02/27/2022   Abdominal pain 02/27/2022   Bilateral calf pain 02/27/2022   Orchitis, epididymitis, and epididymo-orchitis 02/27/2022   Acute encephalopathy 02/26/2022   COPD exacerbation (HCC) 12/13/2015   Elevated troponin 12/13/2015   Hypokalemia 12/13/2015   Retention of urine    Hyperlipidemia    Hypertension    COPD (chronic obstructive pulmonary disease) (HCC)    Arthritis    BPH with elevated PSA    Elevated PSA 10/16/2010   PCP:  No primary care provider on file. Pharmacy:   [id number] [place], [place] - [address] [place] [address] Phone: [phone] Fax: [phone]  [facility] [address] [place] [address] Phone: [phone] Fax: [phone]     Social Determinants of Health (SDOH) Interventions    Readmission Risk Interventions     View : No data to display.

## 2022-03-04 NOTE — Progress Notes (Signed)
PROGRESS NOTE        PATIENT DETAILS Name: Kenneth Woods Age: 88 y.o. Sex: male Date of Birth: 1933/10/22 Admit Date: 02/26/2022 Admitting Physician Ollen Bowl, MD PCP:No primary care provider on file.  Brief Summary: Patient is a 88 y.o.  male with history of BPH, recent history of right epididymitis-has completed a course of Levaquin-most recently placed on Bactrim on 5/4 ED visit-presented to the hospital with altered mental status with possible DIC-subsequently admitted to the hospitalist service for further evaluation and treatment.  Significant events: 5/14>> admit to [facility] for AMS/leukopenia/thrombocytopenia.  Acute urinary retention-requiring Foley catheter insertion. 5/17>>Cortak tube inserted-tube feeds started 5/18>> encephalopathy improving-more awake and more alert.  Significant studies: 5/14>> CT head: No acute findings. 5/14>> CT abdomen/pelvis: No acute findings-marked prostate hypertrophy/chronic bladder outlet obstruction. 5/14>> x-ray left knee: No fracture/dislocation 5/14>> x-ray hip/pelvis: No fracture/dislocation 5/14>> CXR: No pneumonia 5/14>>NH4: normal 5/15>>B12 level:normal 5/15>>B1 level: 81 (low normal) 5/15>> ultrasound scrotum: No torsion-stable complex right hydrocele with mass effect on the right testes. 5/15>>EEG:no seizures 5/15>> lower extremity Doppler: No DVT. 5/16>> MRI pelvis: No findings suspicious of periurethral abscess. 5/16>> MRI brain: No acute infarct.  Significant microbiology data: 5/14>> blood culture: No growth 5/14>> urine culture: No growth 5/15>> COVID/influenza PCR: Negative 5/15>> hydrocele culture: No growth  Procedures: 5/15>> bedside aspiration from right hydrocele by urology  Consults: Urology, hematology, neurology, palliative care, infectious disease  Subjective: Patient in bed, appears comfortable, denies any headache, no fever, no chest pain or pressure, no shortness of breath ,  no abdominal pain. No new focal weakness.   Objective: Vitals: Blood pressure (!) 105/55, pulse 61, temperature 98 F (36.7 C), temperature source Axillary, resp. rate 18, height 5\' 5"  (1.651 m), weight 67 kg, SpO2 91 %.   Exam:  Awake Alert, No new F.N deficits, Normal affect [place].AT,PERRAL Supple Neck, No JVD,   Symmetrical Chest wall movement, Good air movement bilaterally, CTAB RRR,No Gallops, Rubs or new Murmurs,  +ve B.Sounds, Abd Soft, No tenderness,   No Cyanosis, Clubbing or edema     Assessment/Plan:  Acute metabolic encephalopathy: Etiology unclear-neuroimaging negative for significant abnormalities-EEG negative for seizures.  Although felt unlikely to have Wernicke's encephalopathy-remains on high-dose IV thiamine (person used to drink heavily till last year-now a social drinker).  Encephalopathy gradually improving-continue to monitor with supportive care overall much improved.  Pancytopenia-mild coagulopathy-possible DIC: Unclear etiology-probable lingering epididymitis may have caused DIC.  Counts have improved-hematology has signed off.    Recurrent right epididymitis-reactive hydrocele: Had completed 2 courses of fluoroquinolone in the outpatient setting-Bactrim started on 5/4 on ED visit.  Clinically does not have any significant epididymitis on exam.  Has a reactive hydrocele-that was aspirated at bedside by urology-cultures negative as well.  Remains on empiric Rocephin (end date 5/20) and doxycycline (end date 5/22) per ID.  Note-sepsis ruled out.  AKI: Mild-resolved-probably hemodynamic related kidney injury and from acute urinary retention.     Acute urinary retention: S/p Foley catheter inserted in the ED-remains on finasteride/Flomax-discussed with urology-Dr. 6/22 on 5/17-continue Foley catheter on discharge-patient has a very large prostate-urology will address Foley catheter upon follow-up in the office.    Dysphagia: Due to severe encephalopathy-initially  he had NG tube now removed tolerating oral diet.  BPH: Continue finasteride/Flomax  Bilateral calf pain: Dopplers negative.  HTN: BP stable without the use  of any antihypertensives-follow-up.  3.4 cm abdominal aortic aneurysm: Defer further to the outpatient setting, blood pressure too low for beta-blocker.  Debility/deconditioning: Now that encephalopathy has improved-we will obtain PT/OT.  Palliative care: Full code-continue full scope of treatment-palliative care following.  Nutrition Status: Nutrition Problem: Severe Malnutrition Etiology: chronic illness (COPD) Signs/Symptoms: severe muscle depletion, severe fat depletion Interventions: Tube feeding, MVI, Prostat    BMI: Estimated body mass index is 24.58 kg/m as calculated from the following:   Height as of this encounter:  (1.651 m).   Weight as of this encounter: 67 kg.   Code status:   Code Status: Full Code   DVT Prophylaxis: SCDs Start: 02/27/22 0102   Family Communication: [name] [phone]-updated over the phone 5/15, updated son Onalee Hua at bedside on 03/02/22 by previous MD   Disposition Plan: Status is: Inpatient Remains inpatient appropriate because: Severe encephalopathy-not yet at baseline-we will require several days of hospitalization.   Planned Discharge Destination:Skilled nursing facility vs SNF   Diet: Diet Order             DIET - DYS 1 Room service appropriate? Yes with Assist; Fluid consistency: Thin  Diet effective now                      MEDICATIONS: Scheduled Meds:  Chlorhexidine Gluconate Cloth  6 each Topical Daily   doxycycline  100 mg Oral Q12H   finasteride  5 mg Oral Daily   tamsulosin  0.4 mg Oral Daily   Continuous Infusions:  cefTRIAXone (ROCEPHIN)  IV Stopped (03/03/22 1143)   PRN Meds:.acetaminophen, albuterol, LORazepam, ondansetron **OR** ondansetron (ZOFRAN) IV   I have personally reviewed following labs and imaging studies  LABORATORY  DATA:  Recent Labs  Lab 02/26/22 1401 02/26/22 1503 02/28/22 0120 03/01/22 0125 03/02/22 0517 03/02/22 0709 03/03/22 0539 03/04/22 0106  WBC 3.2*   < > 3.4* 7.6 13.3*  --  8.2 5.4  HGB 13.9   < > 11.4* 13.0 12.5*  --  13.1 12.2*  HCT 42.3   < > 35.4* 40.2 38.2*  --  38.5* 37.5*  PLT 42*   < > 54*  52* 73*  74* 104*  --  128* 136*  MCV 87.0   < > 87.8 88.5 87.0  --  85.4 86.6  MCH 28.6   < > 28.3 28.6 28.5  --  29.0 28.2  MCHC 32.9   < > 32.2 32.3 32.7  --  34.0 32.5  RDW 14.1   < > 14.5 14.8 14.9  --  14.8 14.8  LYMPHSABS 0.6*  --   --  5.6*  --  10.6*  --   --   MONOABS 0.3  --   --  0.4  --  0.5  --   --   EOSABS 0.0  --   --  0.0  --  0.0  --   --   BASOSABS 0.0  --   --  0.2*  --  0.1  --   --    < > = values in this interval not displayed.    Recent Labs  Lab  0000 02/26/22 1206 02/26/22 1207 02/26/22 1401 02/26/22 1503 02/26/22 2314 02/27/22 0123 02/27/22 0408 02/28/22 0120 03/01/22 0125 03/01/22 1647 03/02/22 0517 03/02/22 1659 03/03/22 0539  NA  --   --  135  --    < >  --  134*  --  138 143  --  142  --  140  K  --   --  4.4  --    < >  --  4.4  --  3.9 4.1  --  3.9  --  3.9  CL   < >  --  98  --   --   --  107  --  111 116*  --  114*  --  108  CO2   < >  --  30  --   --   --  20*  --  21* 21*  --  25  --  28  GLUCOSE   < >  --  141*  --   --   --  97  --  90 78  --  119*  --  177*  BUN   < >  --  43*  --   --   --  35*  --  28* 27*  --  24*  --  19  CREATININE   < >  --  1.33*  --   --   --  0.97  --  1.04 0.98  --  0.81  --  0.70  CALCIUM   < >  --  9.4  --   --   --  8.5*  --  8.3* 8.8*  --  8.6*  --  8.6*  AST   < >  --   --  47*  --   --  47*  --  37 40  --  39  --  32  ALT   < >  --   --  33  --   --  32  --  26 25  --  24  --  19  ALKPHOS   < >  --   --  64  --   --  57  --  47 47  --  44  --  56  BILITOT   < >  --   --  0.5  --   --  0.5  --  0.3 0.2*  --  0.6  --  0.5  ALBUMIN   < >  --   --  3.4*  --   --  2.4*  --  2.0* 2.0*  --  2.0*  --   2.1*  MG  --   --  2.9*  --   --   --   --   --   --   --  2.2 2.1 2.0 2.0  PHOS  --   --   --   --   --   --   --   --   --   --  1.9* 1.9* 2.9 2.7  DDIMER  --   --   --   --   --  11.04*  --   --  12.10* 9.29*  --   --   --   --   LATICACIDVEN  --  1.6  --  2.0*  --   --  2.5* 1.7  --   --   --   --   --   --   INR  --   --   --  1.1  --  1.2  --   --  1.4* 1.7*  --   --   --   --   TSH  --   --   --   --   --   --  0.722  --   --   --   --   --   --   --  AMMONIA  --   --   --  33  --   --   --   --   --   --   --   --   --   --    < > = values in this interval not displayed.          RADIOLOGY STUDIES/RESULTS: No results found.   LOS: 6 days   Signature  Susa RaringPrashant Uchechukwu Dhawan M.D on 03/04/2022 at 12:16 PM   -  To page go to [email or web address]

## 2022-03-04 NOTE — Evaluation (Signed)
Physical Therapy Evaluation Patient Details Name: Kenneth Woods MRN: [id number] DOB: [birth year] Today's Date: 03/04/2022  History of Present Illness  88 yo male with onset of painful perineum after battling orchitis and epididymitis was admitted 5/14 and now is less painful but still more confused than previously.  Dx with acute encephalitis, ongoing ABT for his testicular infection.  PMHx:  BPH, HTN, COPD, OA, HLD, back pain, R UE fracture,  Clinical Impression  Pt was seen for initial assessment of mobility, and note his struggle to answer questions with confusion and possibly poor hearing.  Pt is typically home alone, and will recommend he have 24/7 care for now given the two person assist to move that is required, the depth of his deficits and his cognition.  Family in to give accurate home history for pt, who was so mobile that he has no adaptive walking or other devices or equipment.   Follow acutely for goals of PT with focus on standing control and his quality of mobility with reinforcement of safety and sequence.  SNF care required due to 24/7 care needed along with the depth of deficits, and length of time potentially to recover more independence.  Family involved with his care and should be supportive of follow up needs.     Recommendations for follow up therapy are one component of a multi-disciplinary discharge planning process, led by the attending physician.  Recommendations may be updated based on patient status, additional functional criteria and insurance authorization.  Follow Up Recommendations Skilled nursing-short term rehab (<3 hours/day)    Assistance Recommended at Discharge Frequent or constant Supervision/Assistance  Patient can return home with the following  Two people to help with walking and/or transfers;Two people to help with bathing/dressing/bathroom;Assistance with cooking/housework;Direct supervision/assist for medications management;Direct supervision/assist  for financial management;Assist for transportation;Help with stairs or ramp for entrance    Equipment Recommendations None recommended by PT (await decisions in SNF)  Recommendations for Other Services       Functional Status Assessment Patient has had a recent decline in their functional status and demonstrates the ability to make significant improvements in function in a reasonable and predictable amount of time.     Precautions / Restrictions Precautions Precautions: Fall Precaution Comments: new O2 use Restrictions Weight Bearing Restrictions: No      Mobility  Bed Mobility Overal bed mobility: Needs Assistance Bed Mobility: Supine to Sit, Rolling Rolling: Mod assist   Supine to sit: Mod assist, +2 for physical assistance, +2 for safety/equipment     General bed mobility comments: mod assist due to pt indicating LLE is not working to move it, might be broken    Transfers Overall transfer level: Needs assistance Equipment used: 2 person hand held assist Transfers: Sit to/from Stand, Bed to chair/wheelchair/BSC Sit to Stand: Max assist, +2 physical assistance, +2 safety/equipment Stand pivot transfers: Max assist, +2 physical assistance, +2 safety/equipment Step pivot transfers: Max assist, +2 physical assistance, +2 safety/equipment       General transfer comment: pt is in flexed posture of hips and knees, unable to fully stand up to move to chair    Ambulation/Gait               General Gait Details: unsafe to attempt  Stairs            Wheelchair Mobility    Modified Rankin (Stroke Patients Only)       Balance Overall balance assessment: Needs assistance Sitting-balance support: Feet supported, Bilateral upper extremity  supported (upper body support needed) Sitting balance-Leahy Scale: Poor   Postural control: Posterior lean, Right lateral lean Standing balance support: Bilateral upper extremity supported, During functional  activity Standing balance-Leahy Scale: Zero                               Pertinent Vitals/Pain Pain Assessment Pain Assessment: No/denies pain    Home Living Family/patient expects to be discharged to:: Unsure Living Arrangements: Alone Available Help at Discharge: Family;Available PRN/intermittently Type of Home: House Home Access: Stairs to enter Entrance Stairs-Rails: Can reach both Entrance Stairs-Number of Steps: 2   Home Layout: One level Home Equipment: None Additional Comments: per son the other son is next door and drops in to help    Prior Function Prior Level of Function : Independent/Modified Independent             Mobility Comments: no AD used or in the home ADLs Comments: per family was independent     Hand Dominance   Dominant Hand: Right    Extremity/Trunk Assessment   Upper Extremity Assessment Upper Extremity Assessment: Defer to OT evaluation    Lower Extremity Assessment Lower Extremity Assessment: Generalized weakness    Cervical / Trunk Assessment Cervical / Trunk Assessment: Kyphotic  Communication   Communication: HOH;Receptive difficulties (may be both issues)  Cognition Arousal/Alertness: Awake/alert Behavior During Therapy: Flat affect Overall Cognitive Status: Impaired/Different from baseline Area of Impairment: Problem solving, Awareness, Safety/judgement, Following commands, Memory, Attention, Orientation                 Orientation Level: Situation, Time Current Attention Level: Selective Memory: Decreased short-term memory Following Commands: Follows one step commands with increased time, Follows one step commands inconsistently Safety/Judgement: Decreased awareness of safety, Decreased awareness of deficits Awareness: Intellectual Problem Solving: Slow processing, Requires verbal cues, Requires tactile cues General Comments: family in at end of session to determine his PLOF        General  Comments General comments (skin integrity, edema, etc.): pt is up to side of bed with help to pivot on bedpad, then standing only with two person help to support and pull up to his feet    Exercises     Assessment/Plan    PT Assessment Patient needs continued PT services  PT Problem List Decreased strength;Decreased activity tolerance;Decreased balance;Decreased mobility;Decreased coordination;Decreased cognition;Decreased knowledge of use of DME;Decreased safety awareness;Cardiopulmonary status limiting activity       PT Treatment Interventions DME instruction;Gait training;Functional mobility training;Therapeutic activities;Therapeutic exercise;Balance training;Neuromuscular re-education;Stair training;Patient/family education    PT Goals (Current goals can be found in the Care Plan section)  Acute Rehab PT Goals Patient Stated Goal: none stated PT Goal Formulation: With family Time For Goal Achievement: 03/17/22 Potential to Achieve Goals: Good    Frequency Min 2X/week     Co-evaluation PT/OT/SLP Co-Evaluation/Treatment: Yes Reason for Co-Treatment: Complexity of the patient's impairments (multi-system involvement);For patient/therapist safety;To address functional/ADL transfers;Necessary to address cognition/behavior during functional activity PT goals addressed during session: Mobility/safety with mobility;Balance         AM-PAC PT "6 Clicks" Mobility  Outcome Measure Help needed turning from your back to your side while in a flat bed without using bedrails?: A Lot Help needed moving from lying on your back to sitting on the side of a flat bed without using bedrails?: Total Help needed moving to and from a bed to a chair (including a wheelchair)?: Total Help needed standing  up from a chair using your arms (e.g., wheelchair or bedside chair)?: Total Help needed to walk in hospital room?: Total Help needed climbing 3-5 steps with a railing? : Total 6 Click Score: 7     End of Session Equipment Utilized During Treatment: Gait belt Activity Tolerance: Patient limited by fatigue;Patient limited by lethargy Patient left: in chair;with call bell/phone within reach;with chair alarm set Nurse Communication: Mobility status PT Visit Diagnosis: Unsteadiness on feet (R26.81);Muscle weakness (generalized) (M62.81);Difficulty in walking, not elsewhere classified (R26.2)    Time: [id number] PT Time Calculation (min) (ACUTE ONLY): 40 min   Charges:   PT Evaluation $PT Eval Moderate Complexity: 1 Mod PT Treatments $Therapeutic Activity: 8-22 mins       Ivar DrapeRuth E Sylver Vantassell 03/04/2022, 1:12 PM  Samul Dadauth Beecher Furio, PT PhD Acute Rehab Dept. Number: [facility] [id number] and [facility] [phone]

## 2022-03-05 DIAGNOSIS — G9340 Encephalopathy, unspecified: Secondary | ICD-10-CM | POA: Diagnosis not present

## 2022-03-05 LAB — BASIC METABOLIC PANEL
Anion gap: 5 (ref 5–15)
BUN: 15 mg/dL (ref 8–23)
CO2: 30 mmol/L (ref 22–32)
Calcium: 8.6 mg/dL — ABNORMAL LOW (ref 8.9–10.3)
Chloride: 104 mmol/L (ref 98–111)
Creatinine, Ser: 0.66 mg/dL (ref 0.61–1.24)
GFR, Estimated: >60 mL/min (ref >60)
Glucose, Bld: 88 mg/dL (ref 70–99)
Potassium: 3.7 mmol/L (ref 3.5–5.1)
Sodium: 139 mmol/L (ref 135–145)

## 2022-03-05 LAB — CBC
HCT: 35 % — ABNORMAL LOW (ref 39.0–52.0)
Hemoglobin: 11.8 g/dL — ABNORMAL LOW (ref 13.0–17.0)
MCH: 28.9 pg (ref 26.0–34.0)
MCHC: 33.7 g/dL (ref 30.0–36.0)
MCV: 85.8 fL (ref 80.0–100.0)
Platelets: 154 10*3/uL (ref 150–400)
RBC: 4.08 MIL/uL — ABNORMAL LOW (ref 4.22–5.81)
RDW: 14.8 % (ref 11.5–15.5)
WBC: 4.1 10*3/uL (ref 4.0–10.5)
nRBC: 0 % (ref 0.0–0.2)

## 2022-03-05 LAB — GLUCOSE, CAPILLARY
Glucose-Capillary: 66 mg/dL — ABNORMAL LOW (ref 70–99)
Glucose-Capillary: 80 mg/dL (ref 70–99)
Glucose-Capillary: 95 mg/dL (ref 70–99)

## 2022-03-05 LAB — MAGNESIUM: Magnesium: 2.2 mg/dL (ref 1.7–2.4)

## 2022-03-05 MED ORDER — HALOPERIDOL LACTATE 5 MG/ML IJ SOLN: 1.0000 mg | Freq: Four times a day (QID) | INTRAMUSCULAR | Status: DC | PRN

## 2022-03-05 MED ORDER — QUETIAPINE FUMARATE 25 MG PO TABS
12.5000 mg | ORAL_TABLET | Freq: Every day | ORAL | Status: DC
  Administered 2022-03-05: 12.5 mg via ORAL
  Filled 2022-03-05: qty 1

## 2022-03-05 NOTE — Progress Notes (Signed)
PROGRESS NOTE        PATIENT DETAILS Name: Kenneth Woods Age: 88 y.o. Sex: male Date of Birth: 02/01/1934 Admit Date: 02/26/2022 Admitting Physician Ollen Bowl, MD PCP:No primary care provider on file.  Brief Summary: Patient is a 88 y.o.  male with history of BPH, recent history of right epididymitis-has completed a course of Levaquin-most recently placed on Bactrim on 5/4 ED visit-presented to the hospital with altered mental status with possible DIC-subsequently admitted to the hospitalist service for further evaluation and treatment.  Significant events: 5/14>> admit to [facility] for AMS/leukopenia/thrombocytopenia.  Acute urinary retention-requiring Foley catheter insertion. 5/17>>Cortak tube inserted-tube feeds started 5/18>> encephalopathy improving-more awake and more alert.  Significant studies: 5/14>> CT head: No acute findings. 5/14>> CT abdomen/pelvis: No acute findings-marked prostate hypertrophy/chronic bladder outlet obstruction. 5/14>> x-ray left knee: No fracture/dislocation 5/14>> x-ray hip/pelvis: No fracture/dislocation 5/14>> CXR: No pneumonia 5/14>>NH4: normal 5/15>>B12 level:normal 5/15>>B1 level: 81 (low normal) 5/15>> ultrasound scrotum: No torsion-stable complex right hydrocele with mass effect on the right testes. 5/15>>EEG:no seizures 5/15>> lower extremity Doppler: No DVT. 5/16>> MRI pelvis: No findings suspicious of periurethral abscess. 5/16>> MRI brain: No acute infarct.  Significant microbiology data: 5/14>> blood culture: No growth 5/14>> urine culture: No growth 5/15>> COVID/influenza PCR: Negative 5/15>> hydrocele culture: No growth  Procedures: 5/15>> bedside aspiration from right hydrocele by urology  Consults: Urology, hematology, neurology, palliative care, infectious disease  Subjective: Patient in bed, appears comfortable, denies any headache, no fever, no chest pain or pressure, no shortness of breath ,  no abdominal pain. No new focal weakness.  Objective: Vitals: Blood pressure (!) 144/68, pulse 69, temperature 98.2 F (36.8 C), temperature source Oral, resp. rate (!) 23, height 5\' 5"  (1.651 m), weight 66 kg, SpO2 94 %.   Exam:  Awake mildly confused, No new F.N deficits,  [place].AT,PERRAL Supple Neck, No JVD,   Symmetrical Chest wall movement, Good air movement bilaterally, CTAB RRR,No Gallops, Rubs or new Murmurs,  +ve B.Sounds, Abd Soft, No tenderness,   No Cyanosis, Clubbing or edema    Assessment/Plan:  Acute metabolic encephalopathy: Etiology unclear-neuroimaging negative for significant abnormalities-EEG negative for seizures.  Although felt unlikely to have Wernicke's encephalopathy-remains on high-dose IV thiamine (person used to drink heavily till last year-now a social drinker).  Encephalopathy gradually improving-continue to monitor with supportive care overall much improved.  Still has mild intermittent delirium which is hospital-acquired.  As needed Haldol at nighttime Seroquel added  Pancytopenia-mild coagulopathy-possible DIC: Unclear etiology-probable lingering epididymitis may have caused DIC.  Counts have improved-hematology has signed off.    Recurrent right epididymitis-reactive hydrocele: Had completed 2 courses of fluoroquinolone in the outpatient setting-Bactrim started on 5/4 on ED visit.  Clinically does not have any significant epididymitis on exam.  Has a reactive hydrocele-that was aspirated at bedside by urology-cultures negative as well.  Remains on empiric Rocephin (end date 5/20) and doxycycline (end date 5/22) per ID.  Note-sepsis ruled out.  AKI: Mild-resolved-probably hemodynamic related kidney injury and from acute urinary retention.     Acute urinary retention: S/p Foley catheter inserted in the ED-remains on finasteride/Flomax-discussed with urology-Dr. 6/22 on 5/17-continue Foley catheter on discharge-patient has a very large prostate-urology will  address Foley catheter upon follow-up in the office.    Dysphagia: Due to severe encephalopathy-initially he had NG tube now removed tolerating oral diet.  BPH: Continue finasteride/Flomax  Bilateral calf pain: Dopplers negative.  HTN: BP stable without the use of any antihypertensives-follow-up.  3.4 cm abdominal aortic aneurysm: Defer further to the outpatient setting, blood pressure too low for beta-blocker.  Debility/deconditioning: Now that encephalopathy has improved-we will obtain PT/OT.  Palliative care: Full code-continue full scope of treatment-palliative care following.  Nutrition Status: Nutrition Problem: Severe Malnutrition Etiology: chronic illness (COPD) Signs/Symptoms: severe muscle depletion, severe fat depletion Interventions: Tube feeding, MVI, Prostat    BMI: Estimated body mass index is 24.21 kg/m as calculated from the following:   Height as of this encounter:  (1.651 m).   Weight as of this encounter: 66 kg.   Code status:   Code Status: Full Code   DVT Prophylaxis: SCDs Start: 02/27/22 0102   Family Communication: [name] [phone]-updated over the phone 5/15, updated son Onalee Hua at bedside on 03/02/22 by previous MD, son updated at bedside on 03/04/2022   Disposition Plan: Status is: Inpatient Remains inpatient appropriate because: Severe encephalopathy-not yet at baseline-we will require several days of hospitalization.   Planned Discharge Destination:Skilled nursing facility vs SNF   Diet: Diet Order             DIET - DYS 1 Room service appropriate? Yes with Assist; Fluid consistency: Thin  Diet effective now                  MEDICATIONS: Scheduled Meds:  Chlorhexidine Gluconate Cloth  6 each Topical Daily   doxycycline  100 mg Oral Q12H   finasteride  5 mg Oral Daily   QUEtiapine  12.5 mg Oral QHS   tamsulosin  0.4 mg Oral Daily   Continuous Infusions:   PRN Meds:.acetaminophen, albuterol, haloperidol  lactate, ondansetron **OR** ondansetron (ZOFRAN) IV   I have personally reviewed following labs and imaging studies  LABORATORY DATA:  Recent Labs  Lab 02/26/22 1401 02/26/22 1503 03/01/22 0125 03/02/22 0517 03/02/22 0709 03/03/22 0539 03/04/22 0106 03/05/22 0624  WBC 3.2*   < > 7.6 13.3*  --  8.2 5.4 4.1  HGB 13.9   < > 13.0 12.5*  --  13.1 12.2* 11.8*  HCT 42.3   < > 40.2 38.2*  --  38.5* 37.5* 35.0*  PLT 42*   < > 73*  74* 104*  --  128* 136* 154  MCV 87.0   < > 88.5 87.0  --  85.4 86.6 85.8  MCH 28.6   < > 28.6 28.5  --  29.0 28.2 28.9  MCHC 32.9   < > 32.3 32.7  --  34.0 32.5 33.7  RDW 14.1   < > 14.8 14.9  --  14.8 14.8 14.8  LYMPHSABS 0.6*  --  5.6*  --  10.6*  --   --   --   MONOABS 0.3  --  0.4  --  0.5  --   --   --   EOSABS 0.0  --  0.0  --  0.0  --   --   --   BASOSABS 0.0  --  0.2*  --  0.1  --   --   --    < > = values in this interval not displayed.    Recent Labs  Lab 02/26/22 1206 02/26/22 1207 02/26/22 1401 02/26/22 1503 02/26/22 2314 02/27/22 0123 02/27/22 0408 02/28/22 0120 03/01/22 0125 03/01/22 1647 03/02/22 0517 03/02/22 1659 03/03/22 0539 03/05/22 0624  NA  --    < >  --    < >  --  134*  --  138 143  --  142  --  140 139  K  --    < >  --    < >  --  4.4  --  3.9 4.1  --  3.9  --  3.9 3.7  CL  --    < >  --   --   --  107  --  111 116*  --  114*  --  108 104  CO2  --    < >  --   --   --  20*  --  21* 21*  --  25  --  28 30  GLUCOSE  --    < >  --   --   --  97  --  90 78  --  119*  --  177* 88  BUN  --    < >  --   --   --  35*  --  28* 27*  --  24*  --  19 15  CREATININE  --    < >  --   --   --  0.97  --  1.04 0.98  --  0.81  --  0.70 0.66  CALCIUM  --    < >  --   --   --  8.5*  --  8.3* 8.8*  --  8.6*  --  8.6* 8.6*  AST  --    < > 47*  --   --  47*  --  37 40  --  39  --  32  --   ALT  --    < > 33  --   --  32  --  26 25  --  24  --  19  --   ALKPHOS  --    < > 64  --   --  57  --  47 47  --  44  --  56  --   BILITOT  --    <  > 0.5  --   --  0.5  --  0.3 0.2*  --  0.6  --  0.5  --   ALBUMIN  --    < > 3.4*  --   --  2.4*  --  2.0* 2.0*  --  2.0*  --  2.1*  --   MG  --    < >  --   --   --   --   --   --   --  2.2 2.1 2.0 2.0 2.2  PHOS  --   --   --   --   --   --   --   --   --  1.9* 1.9* 2.9 2.7  --   DDIMER  --   --   --   --  11.04*  --   --  12.10* 9.29*  --   --   --   --   --   LATICACIDVEN 1.6  --  2.0*  --   --  2.5* 1.7  --   --   --   --   --   --   --   INR  --   --  1.1  --  1.2  --   --  1.4* 1.7*  --   --   --   --   --   TSH  --   --   --   --   --  0.722  --   --   --   --   --   --   --   --   AMMONIA  --   --  33  --   --   --   --   --   --   --   --   --   --   --    < > = values in this interval not displayed.   RADIOLOGY STUDIES/RESULTS: No results found.   LOS: 7 days   Signature  Susa RaringPrashant Jericca Russett M.D on 03/05/2022 at 11:02 AM   -  To page go to [email or web address]

## 2022-03-06 ENCOUNTER — Inpatient Hospital Stay (HOSPITAL_COMMUNITY): Payer: Medicare Other

## 2022-03-06 DIAGNOSIS — I5021 Acute systolic (congestive) heart failure: Secondary | ICD-10-CM

## 2022-03-06 DIAGNOSIS — G9340 Encephalopathy, unspecified: Secondary | ICD-10-CM | POA: Diagnosis not present

## 2022-03-06 LAB — BRAIN NATRIURETIC PEPTIDE: B Natriuretic Peptide: 529.7 pg/mL — ABNORMAL HIGH (ref 0.0–100.0)

## 2022-03-06 LAB — COMPREHENSIVE METABOLIC PANEL
ALT: 32 U/L (ref 0–44)
AST: 51 U/L — ABNORMAL HIGH (ref 15–41)
Albumin: 2.3 g/dL — ABNORMAL LOW (ref 3.5–5.0)
Alkaline Phosphatase: 47 U/L (ref 38–126)
Anion gap: 4 — ABNORMAL LOW (ref 5–15)
BUN: 17 mg/dL (ref 8–23)
CO2: 29 mmol/L (ref 22–32)
Calcium: 8.7 mg/dL — ABNORMAL LOW (ref 8.9–10.3)
Chloride: 105 mmol/L (ref 98–111)
Creatinine, Ser: 0.73 mg/dL (ref 0.61–1.24)
GFR, Estimated: >60 mL/min (ref >60)
Glucose, Bld: 96 mg/dL (ref 70–99)
Potassium: 3.7 mmol/L (ref 3.5–5.1)
Sodium: 138 mmol/L (ref 135–145)
Total Bilirubin: 0.8 mg/dL (ref 0.3–1.2)
Total Protein: 5.2 g/dL — ABNORMAL LOW (ref 6.5–8.1)

## 2022-03-06 LAB — ECHOCARDIOGRAM COMPLETE
Area-P 1/2: 3.2 cm2
Height: 65 in
S' Lateral: 3.1 cm
Weight: 2278.67 oz

## 2022-03-06 LAB — CBC WITH DIFFERENTIAL/PLATELET
Abs Immature Granulocytes: 0.02 10*3/uL (ref 0.00–0.07)
Basophils Absolute: 0 10*3/uL (ref 0.0–0.1)
Basophils Relative: 0 %
Eosinophils Absolute: 0.1 10*3/uL (ref 0.0–0.5)
Eosinophils Relative: 1 %
HCT: 35.1 % — ABNORMAL LOW (ref 39.0–52.0)
Hemoglobin: 11.9 g/dL — ABNORMAL LOW (ref 13.0–17.0)
Immature Granulocytes: 1 %
Lymphocytes Relative: 60 %
Lymphs Abs: 2.6 10*3/uL (ref 0.7–4.0)
MCH: 28.9 pg (ref 26.0–34.0)
MCHC: 33.9 g/dL (ref 30.0–36.0)
MCV: 85.2 fL (ref 80.0–100.0)
Monocytes Absolute: 0.4 10*3/uL (ref 0.1–1.0)
Monocytes Relative: 9 %
Neutro Abs: 1.3 10*3/uL — ABNORMAL LOW (ref 1.7–7.7)
Neutrophils Relative %: 29 %
Platelets: 184 10*3/uL (ref 150–400)
RBC: 4.12 MIL/uL — ABNORMAL LOW (ref 4.22–5.81)
RDW: 14.7 % (ref 11.5–15.5)
WBC: 4.4 10*3/uL (ref 4.0–10.5)
nRBC: 0 % (ref 0.0–0.2)

## 2022-03-06 LAB — MAGNESIUM: Magnesium: 2.2 mg/dL (ref 1.7–2.4)

## 2022-03-06 LAB — PATHOLOGIST SMEAR REVIEW

## 2022-03-06 MED ORDER — ISOSORBIDE MONONITRATE ER 30 MG PO TB24
30.0000 mg | ORAL_TABLET | Freq: Every day | ORAL | Status: DC
  Administered 2022-03-06: 15 mg via ORAL
  Filled 2022-03-06: qty 1

## 2022-03-06 MED ORDER — ROSUVASTATIN CALCIUM 5 MG PO TABS
10.0000 mg | ORAL_TABLET | Freq: Every day | ORAL | Status: DC
  Administered 2022-03-06 – 2022-03-07 (×2): 10 mg via ORAL
  Filled 2022-03-06 (×3): qty 2

## 2022-03-06 MED ORDER — HEPARIN SODIUM (PORCINE) 5000 UNIT/ML IJ SOLN
5000.0000 [IU] | Freq: Three times a day (TID) | INTRAMUSCULAR | Status: DC
Start: 2022-03-06 — End: 2022-03-07
  Administered 2022-03-06 – 2022-03-07 (×3): 5000 [IU] via SUBCUTANEOUS
  Filled 2022-03-06 (×4): qty 1

## 2022-03-06 MED ORDER — CARVEDILOL 3.125 MG PO TABS
3.1250 mg | ORAL_TABLET | Freq: Two times a day (BID) | ORAL | Status: DC
  Administered 2022-03-06: 3.125 mg via ORAL
  Filled 2022-03-06 (×2): qty 1

## 2022-03-06 MED ORDER — ISOSORBIDE MONONITRATE ER 30 MG PO TB24
15.0000 mg | ORAL_TABLET | Freq: Every day | ORAL | Status: DC
  Administered 2022-03-07: 15 mg via ORAL
  Filled 2022-03-06: qty 1

## 2022-03-06 MED ORDER — ASPIRIN 81 MG PO CHEW
81.0000 mg | CHEWABLE_TABLET | Freq: Every day | ORAL | Status: DC
Start: 2022-03-06 — End: 2022-03-07
  Administered 2022-03-06 – 2022-03-07 (×2): 81 mg via ORAL
  Filled 2022-03-06 (×2): qty 1

## 2022-03-06 NOTE — Progress Notes (Signed)
PROGRESS NOTE        PATIENT DETAILS Name: Kenneth Woods Age: 88 y.o. Sex: male Date of Birth: 1934-08-15 Admit Date: 02/26/2022 Admitting Physician Ollen Bowl, MD PCP:No primary care provider on file.  Brief Summary: Patient is a 88 y.o.  male with history of BPH, recent history of right epididymitis-has completed a course of Levaquin-most recently placed on Bactrim on 5/4 ED visit-presented to the hospital with altered mental status with possible DIC-subsequently admitted to the hospitalist service for further evaluation and treatment.  Significant events: 5/14>> admit to [facility] for AMS/leukopenia/thrombocytopenia.  Acute urinary retention-requiring Foley catheter insertion. 5/17>>Cortak tube inserted-tube feeds started 5/18>> encephalopathy improving-more awake and more alert.  Significant studies: 5/14>> CT head: No acute findings. 5/14>> CT abdomen/pelvis: No acute findings-marked prostate hypertrophy/chronic bladder outlet obstruction. 5/14>> x-ray left knee: No fracture/dislocation 5/14>> x-ray hip/pelvis: No fracture/dislocation 5/14>> CXR: No pneumonia 5/14>>NH4: normal 5/15>>B12 level:normal 5/15>>B1 level: 81 (low normal) 5/15>> ultrasound scrotum: No torsion-stable complex right hydrocele with mass effect on the right testes. 5/15>>EEG:no seizures 5/15>> lower extremity Doppler: No DVT. 5/16>> MRI pelvis: No findings suspicious of periurethral abscess. 5/16>> MRI brain: No acute infarct.  Significant microbiology data: 5/14>> blood culture: No growth 5/14>> urine culture: No growth 5/15>> COVID/influenza PCR: Negative 5/15>> hydrocele culture: No growth  Procedures: 5/15>> bedside aspiration from right hydrocele by urology 5/22.  Echocardiogram -    Consults: Urology, hematology, neurology, palliative care, infectious disease  Subjective: Patient in bed, appears comfortable, denies any headache, no fever, no chest pain or  pressure, no shortness of breath , no abdominal pain. No new focal weakness.   Objective: Vitals: Blood pressure (!) 161/57, pulse (!) 55, temperature 97.6 F (36.4 C), temperature source Oral, resp. rate (!) 22, height 5\' 5"  (1.651 m), weight 64.6 kg, SpO2 93 %.   Exam:  Awake Alert x1, No new F.N deficits, Normal affect [place].AT,PERRAL Supple Neck, No JVD,   Symmetrical Chest wall movement, Good air movement bilaterally, CTAB RRR,No Gallops, Rubs or new Murmurs,  +ve B.Sounds, Abd Soft, No tenderness,   No Cyanosis, Clubbing or edema    Assessment/Plan:  Acute metabolic encephalopathy: Etiology unclear-neuroimaging negative for significant abnormalities-EEG negative for seizures.  Although felt unlikely to have Wernicke's encephalopathy-remains on high-dose IV thiamine (person used to drink heavily till last year-now a social drinker).  Encephalopathy gradually improving-continue to monitor with supportive care overall much improved.  Still has mild intermittent delirium which is hospital-acquired.  As needed Haldol at nighttime Seroquel added  Pancytopenia-mild coagulopathy-possible DIC: Unclear etiology-probable lingering epididymitis may have caused DIC.  Counts have improved-hematology has signed off.  Will require outpatient hematology follow-up.  Recurrent right epididymitis-reactive hydrocele: Had completed 2 courses of fluoroquinolone in the outpatient setting-Bactrim started on 5/4 on ED visit.  Clinically does not have any significant epididymitis on exam.  Has a reactive hydrocele-that was aspirated at bedside by urology-cultures negative as well.  Remains on empiric Rocephin (end date 5/20) and doxycycline (end date 5/22) per ID.  Note-sepsis ruled out.  AKI: Mild-resolved-probably hemodynamic related kidney injury and from acute urinary retention.     Acute urinary retention: S/p Foley catheter inserted in the ED-remains on finasteride/Flomax-discussed with urology-Dr.  6/22 on 5/17-continue Foley catheter on discharge-patient has a very large prostate-urology will address Foley catheter upon follow-up in the office.    Dysphagia: Due  to severe encephalopathy-initially he had NG tube now removed tolerating oral diet.  Nonspecific telemetry and EKG changes.  Chest pain-free, check echocardiogram to evaluate EF and wall motion, not a candidate for invasive testing or procedures, discussed with son [name] on 03/06/2022 in detail, medical management, low-dose aspirin, statin, beta-blocker and Imdur if tolerated by blood pressure.    BPH: Continue finasteride/Flomax  Bilateral calf pain: Dopplers negative.  HTN: BP stable patient on low-dose Coreg along with Imdur.  3.4 cm abdominal aortic aneurysm: Defer further to the outpatient setting, trial low-dose beta-blocker if blood pressure tolerates.  Debility/deconditioning: Now that encephalopathy has improved-we will obtain PT/OT.  Palliative care: Full code-continue full scope of treatment-palliative care following.  Nutrition Status: Nutrition Problem: Severe Malnutrition Etiology: chronic illness (COPD) Signs/Symptoms: severe muscle depletion, severe fat depletion Interventions: Tube feeding, MVI, Prostat    BMI: Estimated body mass index is 23.7 kg/m as calculated from the following:   Height as of this encounter: 5\' 5"  (1.651 m).   Weight as of this encounter: 64.6 kg.   Code status:   Code Status: Full Code   DVT Prophylaxis: heparin injection 5,000 Units Start: 03/06/22 1400 SCDs Start: 02/27/22 0102   Family Communication:   Son-[name] [phone]-updated bedside on 03/04/2022, over the phone 03/06/2022   updated son 03/08/2022 at bedside on 03/02/22 by previous MD,   Disposition Plan: Status is: Inpatient Remains inpatient appropriate because: Severe encephalopathy-not yet at baseline-we will require several days of hospitalization.   Planned Discharge Destination:Skilled nursing  facility vs SNF   Diet: Diet Order             DIET - DYS 1 Room service appropriate? Yes with Assist; Fluid consistency: Thin  Diet effective now                  MEDICATIONS: Scheduled Meds:  aspirin  81 mg Oral Daily   carvedilol  3.125 mg Oral BID WC   Chlorhexidine Gluconate Cloth  6 each Topical Daily   doxycycline  100 mg Oral Q12H   finasteride  5 mg Oral Daily   heparin injection (subcutaneous)  5,000 Units Subcutaneous Q8H   [START ON 03/07/2022] isosorbide mononitrate  15 mg Oral Daily   rosuvastatin  10 mg Oral Daily   tamsulosin  0.4 mg Oral Daily   Continuous Infusions:   PRN Meds:.acetaminophen, albuterol, ondansetron **OR** ondansetron (ZOFRAN) IV   I have personally reviewed following labs and imaging studies  LABORATORY DATA:  Recent Labs  Lab 03/01/22 0125 03/02/22 0517 03/02/22 0709 03/03/22 0539 03/04/22 0106 03/05/22 0624 03/06/22 0153  WBC 7.6 13.3*  --  8.2 5.4 4.1 4.4  HGB 13.0 12.5*  --  13.1 12.2* 11.8* 11.9*  HCT 40.2 38.2*  --  38.5* 37.5* 35.0* 35.1*  PLT 73*  74* 104*  --  128* 136* 154 184  MCV 88.5 87.0  --  85.4 86.6 85.8 85.2  MCH 28.6 28.5  --  29.0 28.2 28.9 28.9  MCHC 32.3 32.7  --  34.0 32.5 33.7 33.9  RDW 14.8 14.9  --  14.8 14.8 14.8 14.7  LYMPHSABS 5.6*  --  10.6*  --   --   --  2.6  MONOABS 0.4  --  0.5  --   --   --  0.4  EOSABS 0.0  --  0.0  --   --   --  0.1  BASOSABS 0.2*  --  0.1  --   --   --  0.0    Recent Labs  Lab 02/28/22 0120 03/01/22 0125 03/01/22 1647 03/01/22 1647 03/02/22 0517 03/02/22 1659 03/03/22 0539 03/05/22 0624 03/06/22 0153  NA 138 143  --   --  142  --  140 139 138  K 3.9 4.1  --   --  3.9  --  3.9 3.7 3.7  CL 111 116*  --   --  114*  --  108 104 105  CO2 21* 21*  --   --  25  --  28 30 29   GLUCOSE 90 78  --   --  119*  --  177* 88 96  BUN 28* 27*  --   --  24*  --  19 15 17   CREATININE 1.04 0.98  --   --  0.81  --  0.70 0.66 0.73  CALCIUM 8.3* 8.8*  --   --  8.6*  --  8.6*  8.6* 8.7*  AST 37 40  --   --  39  --  32  --  51*  ALT 26 25  --   --  24  --  19  --  32  ALKPHOS 47 47  --   --  44  --  56  --  47  BILITOT 0.3 0.2*  --   --  0.6  --  0.5  --  0.8  ALBUMIN 2.0* 2.0*  --   --  2.0*  --  2.1*  --  2.3*  MG  --   --  2.2   < > 2.1 2.0 2.0 2.2 2.2  PHOS  --   --  1.9*  --  1.9* 2.9 2.7  --   --   DDIMER 12.10* 9.29*  --   --   --   --   --   --   --   INR 1.4* 1.7*  --   --   --   --   --   --   --   BNP  --   --   --   --   --   --   --   --  529.7*   < > = values in this interval not displayed.   RADIOLOGY STUDIES/RESULTS: No results found.   LOS: 8 days   Signature  Susa RaringPrashant Lem Peary M.D on 03/06/2022 at 10:42 AM   -  To page go to [email or web address]

## 2022-03-06 NOTE — Progress Notes (Signed)
  Echocardiogram 2D Echocardiogram has been performed.  Leta Jungling M 03/06/2022, 10:55 AM

## 2022-03-06 NOTE — TOC Progression Note (Signed)
Transition of Care [facility]) - Progression Note    Patient Details  Name: SIDNEY KANN MRN: [id number] Date of Birth: 12/24/1933  [facility]) CM/SW Contact  Mearl Latin, LCSW Phone Number: 03/06/2022, 4:27 PM  Clinical Narrative:    CSW provided SNF bed offers to patient's son, [name]. He stated he has talked with his brother and sister and they are hoping for [facility] in [place]. CSW contacted Debbie with [facility] and they are able to accept patient. CSW started insurance authorization, Ref# [id number].    Expected Discharge Plan: Skilled Nursing Facility Barriers to Discharge: English as a second language teacher, Continued Medical Work up  Expected Discharge Plan and Services Expected Discharge Plan: Skilled Nursing Facility In-house Referral: Clinical Social Work   Post Acute Care Choice: Skilled Nursing Facility Living arrangements for the past 2 months: Single Family Home                                       Social Determinants of Health (SDOH) Interventions    Readmission Risk Interventions     View : No data to display.

## 2022-03-07 DIAGNOSIS — M549 Dorsalgia, unspecified: Secondary | ICD-10-CM | POA: Diagnosis not present

## 2022-03-07 DIAGNOSIS — I10 Essential (primary) hypertension: Secondary | ICD-10-CM | POA: Diagnosis not present

## 2022-03-07 DIAGNOSIS — Z743 Need for continuous supervision: Secondary | ICD-10-CM | POA: Diagnosis not present

## 2022-03-07 DIAGNOSIS — Z20822 Contact with and (suspected) exposure to covid-19: Secondary | ICD-10-CM | POA: Diagnosis not present

## 2022-03-07 DIAGNOSIS — G9341 Metabolic encephalopathy: Secondary | ICD-10-CM | POA: Diagnosis not present

## 2022-03-07 DIAGNOSIS — M545 Low back pain, unspecified: Secondary | ICD-10-CM | POA: Diagnosis not present

## 2022-03-07 DIAGNOSIS — M6281 Muscle weakness (generalized): Secondary | ICD-10-CM | POA: Diagnosis not present

## 2022-03-07 DIAGNOSIS — J449 Chronic obstructive pulmonary disease, unspecified: Secondary | ICD-10-CM | POA: Diagnosis not present

## 2022-03-07 DIAGNOSIS — T83091D Other mechanical complication of indwelling urethral catheter, subsequent encounter: Secondary | ICD-10-CM | POA: Diagnosis not present

## 2022-03-07 DIAGNOSIS — T83098D Other mechanical complication of other indwelling urethral catheter, subsequent encounter: Secondary | ICD-10-CM | POA: Diagnosis not present

## 2022-03-07 DIAGNOSIS — G9340 Encephalopathy, unspecified: Secondary | ICD-10-CM | POA: Diagnosis not present

## 2022-03-07 DIAGNOSIS — N179 Acute kidney failure, unspecified: Secondary | ICD-10-CM | POA: Diagnosis not present

## 2022-03-07 DIAGNOSIS — I110 Hypertensive heart disease with heart failure: Secondary | ICD-10-CM | POA: Diagnosis not present

## 2022-03-07 DIAGNOSIS — T83098A Other mechanical complication of other indwelling urethral catheter, initial encounter: Secondary | ICD-10-CM | POA: Diagnosis not present

## 2022-03-07 DIAGNOSIS — D61818 Other pancytopenia: Secondary | ICD-10-CM | POA: Diagnosis not present

## 2022-03-07 DIAGNOSIS — R404 Transient alteration of awareness: Secondary | ICD-10-CM | POA: Diagnosis not present

## 2022-03-07 DIAGNOSIS — R1310 Dysphagia, unspecified: Secondary | ICD-10-CM | POA: Diagnosis not present

## 2022-03-07 DIAGNOSIS — D65 Disseminated intravascular coagulation [defibrination syndrome]: Secondary | ICD-10-CM | POA: Diagnosis not present

## 2022-03-07 DIAGNOSIS — R140 Abdominal distension (gaseous): Secondary | ICD-10-CM | POA: Diagnosis not present

## 2022-03-07 DIAGNOSIS — R269 Unspecified abnormalities of gait and mobility: Secondary | ICD-10-CM | POA: Diagnosis not present

## 2022-03-07 DIAGNOSIS — R339 Retention of urine, unspecified: Secondary | ICD-10-CM | POA: Diagnosis not present

## 2022-03-07 DIAGNOSIS — N453 Epididymo-orchitis: Secondary | ICD-10-CM | POA: Diagnosis not present

## 2022-03-07 DIAGNOSIS — R2689 Other abnormalities of gait and mobility: Secondary | ICD-10-CM | POA: Diagnosis not present

## 2022-03-07 DIAGNOSIS — E43 Unspecified severe protein-calorie malnutrition: Secondary | ICD-10-CM | POA: Diagnosis not present

## 2022-03-07 DIAGNOSIS — R109 Unspecified abdominal pain: Secondary | ICD-10-CM | POA: Diagnosis not present

## 2022-03-07 DIAGNOSIS — G8929 Other chronic pain: Secondary | ICD-10-CM | POA: Diagnosis not present

## 2022-03-07 DIAGNOSIS — Z7982 Long term (current) use of aspirin: Secondary | ICD-10-CM | POA: Diagnosis not present

## 2022-03-07 DIAGNOSIS — R197 Diarrhea, unspecified: Secondary | ICD-10-CM | POA: Diagnosis not present

## 2022-03-07 DIAGNOSIS — I5022 Chronic systolic (congestive) heart failure: Secondary | ICD-10-CM | POA: Diagnosis not present

## 2022-03-07 DIAGNOSIS — Z515 Encounter for palliative care: Secondary | ICD-10-CM | POA: Diagnosis not present

## 2022-03-07 LAB — COMPREHENSIVE METABOLIC PANEL
ALT: 31 U/L (ref 0–44)
AST: 40 U/L (ref 15–41)
Albumin: 2.3 g/dL — ABNORMAL LOW (ref 3.5–5.0)
Alkaline Phosphatase: 52 U/L (ref 38–126)
Anion gap: 6 (ref 5–15)
BUN: 23 mg/dL (ref 8–23)
CO2: 26 mmol/L (ref 22–32)
Calcium: 8.6 mg/dL — ABNORMAL LOW (ref 8.9–10.3)
Chloride: 104 mmol/L (ref 98–111)
Creatinine, Ser: 0.74 mg/dL (ref 0.61–1.24)
GFR, Estimated: >60 mL/min (ref >60)
Glucose, Bld: 89 mg/dL (ref 70–99)
Potassium: 3.8 mmol/L (ref 3.5–5.1)
Sodium: 136 mmol/L (ref 135–145)
Total Bilirubin: 0.8 mg/dL (ref 0.3–1.2)
Total Protein: 5.1 g/dL — ABNORMAL LOW (ref 6.5–8.1)

## 2022-03-07 LAB — CBC WITH DIFFERENTIAL/PLATELET
Abs Immature Granulocytes: 0.02 10*3/uL (ref 0.00–0.07)
Basophils Absolute: 0 10*3/uL (ref 0.0–0.1)
Basophils Relative: 0 %
Eosinophils Absolute: 0.1 10*3/uL (ref 0.0–0.5)
Eosinophils Relative: 2 %
HCT: 35.2 % — ABNORMAL LOW (ref 39.0–52.0)
Hemoglobin: 12 g/dL — ABNORMAL LOW (ref 13.0–17.0)
Immature Granulocytes: 1 %
Lymphocytes Relative: 54 %
Lymphs Abs: 2.3 10*3/uL (ref 0.7–4.0)
MCH: 28.7 pg (ref 26.0–34.0)
MCHC: 34.1 g/dL (ref 30.0–36.0)
MCV: 84.2 fL (ref 80.0–100.0)
Monocytes Absolute: 0.4 10*3/uL (ref 0.1–1.0)
Monocytes Relative: 11 %
Neutro Abs: 1.3 10*3/uL — ABNORMAL LOW (ref 1.7–7.7)
Neutrophils Relative %: 32 %
Platelets: 195 10*3/uL (ref 150–400)
RBC: 4.18 MIL/uL — ABNORMAL LOW (ref 4.22–5.81)
RDW: 14.8 % (ref 11.5–15.5)
WBC: 4.1 10*3/uL (ref 4.0–10.5)
nRBC: 0 % (ref 0.0–0.2)

## 2022-03-07 LAB — MAGNESIUM: Magnesium: 2.3 mg/dL (ref 1.7–2.4)

## 2022-03-07 LAB — BRAIN NATRIURETIC PEPTIDE: B Natriuretic Peptide: 127.4 pg/mL — ABNORMAL HIGH (ref 0.0–100.0)

## 2022-03-07 MED ORDER — ROSUVASTATIN CALCIUM 10 MG PO TABS: 10.0000 mg | ORAL_TABLET | Freq: Every day | ORAL | Status: AC

## 2022-03-07 MED ORDER — FOLIC ACID 1 MG PO TABS: 1.0000 mg | ORAL_TABLET | Freq: Every day | ORAL | 0 refills | Status: AC

## 2022-03-07 MED ORDER — ASPIRIN 81 MG PO CHEW: 81.0000 mg | CHEWABLE_TABLET | Freq: Every day | ORAL | Status: AC

## 2022-03-07 MED ORDER — QUETIAPINE FUMARATE 25 MG PO TABS
12.5000 mg | ORAL_TABLET | ORAL | Status: AC
  Administered 2022-03-07: 12.5 mg via ORAL
  Filled 2022-03-07: qty 1

## 2022-03-07 MED ORDER — THIAMINE HCL 100 MG PO TABS: 100.0000 mg | ORAL_TABLET | Freq: Every day | ORAL | 0 refills | Status: AC

## 2022-03-07 MED ORDER — MELATONIN 5 MG PO TABS
5.0000 mg | ORAL_TABLET | ORAL | Status: AC
  Administered 2022-03-07: 5 mg via ORAL
  Filled 2022-03-07: qty 1

## 2022-03-07 NOTE — TOC Progression Note (Signed)
Transition of Care [facility]) - Progression Note    Patient Details  Name: DESSIE DELCARLO MRN: [id number] Date of Birth: Jan 27, 1934  Transition of Care [facility]) CM/SW Contact  Mearl Latin, LCSW Phone Number: 03/07/2022, 10:10 AM  Clinical Narrative:    Insurance approval received for [place], Ref# [id number], Auth ID# [id number], effective 03/07/2022-03/09/2022.   CSW updated patient's son [name] on the plan and he is in agreement for [facility]AR as he is a Naval architect and won't be off until [employer]. [facility] can accept patient after 2pm.   Room B18 bed 1   Expected Discharge Plan: Skilled Nursing Facility Barriers to Discharge: English as a second language teacher, Continued Medical Work up  Expected Discharge Plan and Services Expected Discharge Plan: Skilled Nursing Facility In-house Referral: Clinical Social Work   Post Acute Care Choice: Skilled Nursing Facility Living arrangements for the past 2 months: Single Family Home Expected Discharge Date: 03/07/22                                     Social Determinants of Health (SDOH) Interventions    Readmission Risk Interventions     View : No data to display.

## 2022-03-07 NOTE — Progress Notes (Signed)
Occupational Therapy Treatment Patient Details Name: Kenneth Woods MRN: [id number] DOB: Feb 18, 1934 Today's Date: 03/07/2022   History of present illness 88 yo male with onset of painful perineum after battling orchitis and epididymitis was admitted 5/14 and now is less painful but still more confused than previously.  Dx with acute encephalitis, ongoing ABT for his testicular infection.  PMHx:  BPH, HTN, COPD, OA, HLD, back pain, R UE fracture,   OT comments  Pt extremely lethargic today. Total A +2 to roll and go from sidelying to sit. Actively resisting participation in grooming tasks while sitting EOB, pushing away hand with wash cloth and jerking when therapist tried to clean around eyes. Pt able to maintain varying levels of seated balance from max A to brief moments of min guard. Pt max A +2 for sit<>stand and unable to take side steps today. Return supine at max A +2. Very different and more lethargic than initial evaluation. POC remains appropriate and Pt will require SNF post-acute to maximize safety and independence in ADL and basic transfers.    Recommendations for follow up therapy are one component of a multi-disciplinary discharge planning process, led by the attending physician.  Recommendations may be updated based on patient status, additional functional criteria and insurance authorization.    Follow Up Recommendations  Skilled nursing-short term rehab (<3 hours/day)    Assistance Recommended at Discharge Frequent or constant Supervision/Assistance  Patient can return home with the following  Two people to help with walking and/or transfers;A lot of help with bathing/dressing/bathroom;Assistance with cooking/housework;Direct supervision/assist for medications management;Direct supervision/assist for financial management;Assist for transportation;Help with stairs or ramp for entrance   Equipment Recommendations  BSC/3in1    Recommendations for Other Services PT consult     Precautions / Restrictions Precautions Precautions: Fall Restrictions Weight Bearing Restrictions: No       Mobility Bed Mobility Overal bed mobility: Needs Assistance Bed Mobility: Rolling, Sidelying to Sit, Sit to Supine Rolling: Total assist Sidelying to sit: Total assist, +2 for physical assistance, +2 for safety/equipment   Sit to supine: Max assist, +2 for physical assistance, +2 for safety/equipment   General bed mobility comments: Pt lethrgy impacting ability to participate in bed mobility    Transfers Overall transfer level: Needs assistance Equipment used: 2 person hand held assist Transfers: Sit to/from Stand Sit to Stand: Max assist, +2 physical assistance, +2 safety/equipment           General transfer comment: counted and rocked for cueing, Pt with minor adjustment of leg placement to facilitate transfer, but dependent on support from bed and therapists     Balance Overall balance assessment: Needs assistance Sitting-balance support: Bilateral upper extremity supported, Feet supported Sitting balance-Leahy Scale: Poor Sitting balance - Comments: brief moments of min guard Postural control: Posterior lean Standing balance support: Bilateral upper extremity supported, During functional activity Standing balance-Leahy Scale: Zero Standing balance comment: dependent on therapist support                           ADL either performed or assessed with clinical judgement   ADL Overall ADL's : Needs assistance/impaired     Grooming: Wash/dry face;Sitting Grooming Details (indicate cue type and reason): attempted to facilitate hand over hand, and Pt actively resisting             Lower Body Dressing: Total assistance;Bed level  General ADL Comments: Pt resistant to ADL today, pushing away    Extremity/Trunk Assessment Upper Extremity Assessment Upper Extremity Assessment: Generalized weakness   Lower Extremity  Assessment Lower Extremity Assessment: Defer to PT evaluation        Vision   Additional Comments: keeps eyes closed throughout session   Perception     Praxis      Cognition Arousal/Alertness: Lethargic Behavior During Therapy: Flat affect Overall Cognitive Status: Impaired/Different from baseline                                 General Comments: Pt very lethargic today, stated name was "wild bill" otherwise did not verbalize or open eyes throughout session        Exercises      Shoulder Instructions       General Comments      Pertinent Vitals/ Pain       Pain Assessment Pain Assessment: Faces Faces Pain Scale: Hurts a little bit Pain Location: LLE with movement Pain Descriptors / Indicators: Discomfort, Grimacing, Tender Pain Intervention(s): Monitored during session, Repositioned  Home Living                                          Prior Functioning/Environment              Frequency  Min 2X/week        Progress Toward Goals  OT Goals(current goals can now be found in the care plan section)  Progress towards OT goals: Not progressing toward goals - comment (limited by lethargy today)  Acute Rehab OT Goals Patient Stated Goal: none stated OT Goal Formulation: With patient/family Time For Goal Achievement: 03/18/22 Potential to Achieve Goals: Good  Plan Discharge plan remains appropriate;Frequency remains appropriate    Co-evaluation    PT/OT/SLP Co-Evaluation/Treatment: Yes Reason for Co-Treatment: Necessary to address cognition/behavior during functional activity;For patient/therapist safety;To address functional/ADL transfers PT goals addressed during session: Mobility/safety with mobility;Balance;Strengthening/ROM OT goals addressed during session: ADL's and self-care;Strengthening/ROM      AM-PAC OT "6 Clicks" Daily Activity     Outcome Measure   Help from another person eating meals?: A Lot Help  from another person taking care of personal grooming?: A Lot Help from another person toileting, which includes using toliet, bedpan, or urinal?: A Lot Help from another person bathing (including washing, rinsing, drying)?: A Lot Help from another person to put on and taking off regular upper body clothing?: A Lot Help from another person to put on and taking off regular lower body clothing?: A Lot 6 Click Score: 12    End of Session Equipment Utilized During Treatment: Gait belt  OT Visit Diagnosis: Unsteadiness on feet (R26.81);Muscle weakness (generalized) (M62.81);Other abnormalities of gait and mobility (R26.89);Other symptoms and signs involving cognitive function   Activity Tolerance Patient limited by lethargy   Patient Left in bed;with bed alarm set;with call bell/phone within reach;with SCD's reapplied   Nurse Communication Mobility status;Other (comment) (about a 12 inch circle of wetness on sheets with foley)        Time: 5638-9373 OT Time Calculation (min): 19 min  Charges: OT General Charges $OT Visit: 1 Visit OT Treatments $Self Care/Home Management : 8-22 mins Nyoka Cowden OTR/L Acute Rehabilitation Services Pager: [phone] Office: [phone]  Evern Bio Katera Rybka 03/07/2022, 9:28 AM

## 2022-03-07 NOTE — Progress Notes (Signed)
Pt discharging to SNF,[facility] in [place].    At [phone] --Report called to facility at [phone] and given to nurse [facility].   Packet of information put together by SW and in pt's chart for [facility] when they arrive to transport pt to facility.  Waiting [facility] to arrive to transport pt.

## 2022-03-07 NOTE — TOC Transition Note (Signed)
Transition of Care [facility]) - CM/SW Discharge Note   Patient Details  Name: Kenneth Woods MRN: [id number] Date of Birth: 11/14/1933  [facility] of [facility]) CM/SW Contact:  Mearl Latin, LCSW Phone Number: 03/07/2022, 1:26 PM   Clinical Narrative:    Patient will DC to: [facility] SNF Anticipated DC date: 03/07/22 Family notified: Son, [name] Transport by: Dayna Barker   Per MD patient ready for DC to [facility]. RN to call report prior to discharge [phone] Room B18 bed 1 ). RN, patient, patient's family, and facility notified of DC. Discharge Summary and FL2 sent to facility. DC packet on chart. Ambulance transport requested for patient.   CSW will sign off for now as social work intervention is no longer needed. Please consult [place] again if new needs arise.     Final next level of care: Skilled Nursing Facility Barriers to Discharge: Barriers Resolved   Patient Goals and CMS Choice Patient states their goals for this hospitalization and ongoing recovery are:: Rehab CMS Medicare.gov Compare Post Acute Care list provided to:: Patient Represenative (must comment) Choice offered to / list presented to : Adult Children (son Molly Maduro)  Discharge Placement   Existing PASRR number confirmed : 03/07/22          Patient chooses bed at: [facility] Patient to be transferred to facility by: PT[facility] Name of family member notified: Bobby Patient and family notified of of transfer: 03/07/22  Discharge Plan and Services In-house Referral: Clinical Social Work   Post Acute Care Choice: Skilled Nursing Facility                               Social Determinants of Health (SDOH) Interventions     Readmission Risk Interventions     View : No data to display.

## 2022-03-07 NOTE — Discharge Summary (Signed)
Kenneth Woods [id number] DOB: 03/16/1934 DOA: 02/26/2022  PCP: No primary care provider on file.  Admit date: 02/26/2022  Discharge date: 03/07/2022  Admitted From: Home   Disposition:  SNF   Recommendations for Outpatient Follow-up:   Follow up with PCP in 1-2 weeks  PCP Please obtain BMP/CBC, 2 view CXR in 1week,  (see Discharge instructions)   PCP Please follow up on the following pending results: needs to follow-up with urologist along with hematologist within 1 to 2 weeks of discharge.   Home Health: None   Equipment/Devices: None  Consultations: Urology, hematology, neurology, palliative care, infectious disease Discharge Condition: Stable    CODE STATUS: Full    Diet Recommendation: Dysphagia 1 diet with feeding assistance and aspiration precautions    Chief Complaint  Patient presents with   Fatigue     Brief history of present illness from the day of admission and additional interim summary    88 y.o.  male with history of BPH, recent history of right epididymitis-has completed a course of [place] recently placed on Bactrim on 5/4 ED visit-presented to the hospital with altered mental status with possible DIC-subsequently admitted to the hospitalist service for further evaluation and treatment.   Significant events: 5/14>> admit to [facility] for AMS/leukopenia/thrombocytopenia.  Acute urinary retention-requiring Foley catheter insertion. 5/17>>Cortak tube inserted-tube feeds started 5/18>> encephalopathy improving-more awake and more alert.   Significant studies: 5/14>> CT head: No acute findings. 5/14>> CT abdomen/pelvis: No acute findings-marked prostate hypertrophy/chronic bladder outlet obstruction. 5/14>> x-ray left knee: No fracture/dislocation 5/14>> x-ray hip/pelvis: No  fracture/dislocation 5/14>> CXR: No pneumonia 5/14>>NH4: normal 5/15>>B12 level:normal 5/15>>B1 level: 81 (low normal) 5/15>> ultrasound scrotum: No torsion-stable complex right hydrocele with mass effect on the right testes. 5/15>>EEG:no seizures 5/15>> lower extremity Doppler: No DVT. 5/16>> MRI pelvis: No findings suspicious of periurethral abscess. 5/16>> MRI brain: No acute infarct.   Significant microbiology data: 5/14>> blood culture: No growth 5/14>> urine culture: No growth 5/15>> COVID/influenza PCR: Negative 5/15>> hydrocele culture: No growth   Procedures: 5/15>> bedside aspiration from right hydrocele by urology                                                                 Hospital Course    Acute metabolic encephalopathy: Etiology unclear-neuroimaging negative for significant abnormalities-EEG negative for seizures.  Although felt unlikely to have Wernicke's encephalopathy-based on folic acid and thiamine due to past history of heavy alcohol use.  Encephalopathy gradually improving-continue to monitor with supportive care overall much improved.  Still has mild intermittent delirium which is hospital-acquired.  Overall stable, will be discharged to SNF.   Pancytopenia-mild coagulopathy-possible DIC: Unclear etiology-probable lingering epididymitis may have caused DIC.  Counts have improved-hematology has signed off.  Will require outpatient hematology follow-up.   Recurrent right epididymitis-reactive hydrocele:  Had completed 2 courses of fluoroquinolone in the outpatient setting-Bactrim started on 5/4 on ED visit.  Clinically does not have any significant epididymitis on exam.  Has a reactive hydrocele-that was aspirated at bedside by urology-cultures negative as well.  Remains on empiric Rocephin (end date 5/20) and doxycycline (end date 5/22) per ID.  Note-sepsis ruled out.  Will require outpatient urology follow-up postdischarge.   AKI: Mild-resolved-probably  hemodynamic related kidney injury and from acute urinary retention.      Acute urinary retention: S/p Foley catheter inserted in the ED-remains on finasteride/Flomax-discussed with urology-Dr. Milford Cage on 5/17-continue Foley catheter on discharge-patient has a very large prostate-urology will address Foley catheter upon follow-up in the office.     Dysphagia: Due to severe encephalopathy-initially he had NG tube now removed tolerating oral diet.   Nonspecific telemetry and EKG changes.  Chest pain-free, able echocardiogram, not a candidate for invasive testing or procedures.  Placed on aspirin and statin for secondary prevention.  Heart rate too low to allow beta-blocker.   BPH: Continue finasteride/Flomax, will be discharged with Foley catheter with outpatient urology follow-up.   Bilateral calf pain: Dopplers negative.   HTN: Continue home regimen BP on the lower side, monitor at SNF.   3.4 cm abdominal aortic aneurysm: Defer further to the outpatient setting, heart rate too low to tolerate beta-blocker.  Debility/deconditioning: PT OT, will require SNF.     Discharge diagnosis     Principal Problem:   Acute encephalopathy Active Problems:   Thrombocytopenia (HCC)   Abdominal pain   Bilateral calf pain   Orchitis, epididymitis, and epididymo-orchitis   Retention of urine   Protein-calorie malnutrition, severe    Discharge instructions    Discharge Instructions     Discharge instructions   Complete by: As directed    Follow with Primary MD  in 7 days   Get CBC, CMP, 2 view Chest X ray -  checked next visit within 1 week by Primary MD or SNF MD    Activity: As tolerated with Full fall precautions use walker/cane & assistance as needed  Disposition SNF  Diet: Dysphagia 1 diet with feeding assistance and aspiration precautions.  Special Instructions: If you have smoked or chewed Tobacco  in the last 2 yrs please stop smoking, stop any regular Alcohol  and or any  Recreational drug use.  On your next visit with your primary care physician please Get Medicines reviewed and adjusted.  Please request your Prim.MD to go over all Hospital Tests and Procedure/Radiological results at the follow up, please get all Hospital records sent to your Prim MD by signing hospital release before you go home.  If you experience worsening of your admission symptoms, develop shortness of breath, life threatening emergency, suicidal or homicidal thoughts you must seek medical attention immediately by calling 911 or calling your MD immediately  if symptoms less severe.  You Must read complete instructions/literature along with all the possible adverse reactions/side effects for all the Medicines you take and that have been prescribed to you. Take any new Medicines after you have completely understood and accpet all the possible adverse reactions/side effects.   Increase activity slowly   Complete by: As directed        Discharge Medications   Allergies as of 03/07/2022       Reactions   Penicillins Anaphylaxis        Medication List     STOP taking these medications    albuterol 108 (90 Base) MCG/ACT  inhaler Commonly known as: VENTOLIN HFA   naproxen sodium 220 MG tablet Commonly known as: ALEVE   sulfamethoxazole-trimethoprim 800-160 MG tablet Commonly known as: BACTRIM DS       TAKE these medications    acetaminophen 500 MG tablet Commonly known as: TYLENOL Take 1,000 mg by mouth every 6 (six) hours as needed for mild pain.   aspirin 81 MG chewable tablet Chew 1 tablet (81 mg total) by mouth daily.   finasteride 5 MG tablet Commonly known as: PROSCAR TAKE 1 TABLET (5 MG TOTAL) BY MOUTH DAILY.   folic acid 1 MG tablet Commonly known as: FOLVITE Take 1 tablet (1 mg total) by mouth daily.   OVER THE COUNTER MEDICATION Take 2 capsules by mouth 2 (two) times daily. PROSTA-STRONG (saw palmetto,lycopene,pumpkin seed extract)   rosuvastatin 10  MG tablet Commonly known as: CRESTOR Take 1 tablet (10 mg total) by mouth daily.   tamsulosin 0.4 MG Caps capsule Commonly known as: FLOMAX TAKE ONE CAPSULE BY MOUTH EVERY DAY AFTER SUPPER What changed: See the new instructions.   thiamine 100 MG tablet Take 1 tablet (100 mg total) by mouth daily.   Wixela Inhub 250-50 MCG/ACT Aepb Generic drug: fluticasone-salmeterol Inhale 1 puff into the lungs 2 (two) times daily.         Contact information for follow-up providers     Alexis Frock, MD. Schedule an appointment as soon as possible for a visit in 1 week(s).   Specialty: Urology Why: Bladder outlet obstruction requiring Foley catheter Contact information: [place] [address] [phone]         Ladell Pier, MD. Schedule an appointment as soon as possible for a visit in 1 week(s).   Specialty: Oncology Contact information: [place] [address] [phone]              Contact information for after-discharge care     [facility] .   Service: Skilled Nursing Contact information: [address] [place] [phone]                     Major procedures and Radiology Reports - PLEASE review detailed and final reports thoroughly  -       CT Abdomen Pelvis Wo Contrast  Result Date: 02/26/2022 CLINICAL DATA:  Generalized abdominal pain. EXAM: CT ABDOMEN AND PELVIS WITHOUT CONTRAST TECHNIQUE: Multidetector CT imaging of the abdomen and pelvis was performed following the standard protocol without IV contrast. RADIATION DOSE REDUCTION: This exam was performed according to the departmental dose-optimization program which includes automated exposure control, adjustment of the mA and/or kV according to patient size and/or use of iterative reconstruction technique. COMPARISON:  01/27/2022. FINDINGS: Lower chest: No acute findings.   Chronic interstitial thickening. Hepatobiliary: Several subcentimeter low-attenuation lesions, stable, consistent with cysts. Liver normal in size and overall attenuation. No other masses. Normal gallbladder. No bile duct dilation. Pancreas: No mass or inflammation. Spleen: Normal in size without focal abnormality. Adrenals/Urinary Tract: No adrenal masses. 1.9 cm exophytic low-attenuation mass, upper pole of the right kidney. 1.9 cm low-attenuation mass, midpole the right kidney, both stable and consistent with cysts. No collecting system stones. No hydronephrosis. Ureters normal in course and in caliber. Bladder is distended with irregular wall thickening and cellule formation mostly along the superior aspect. Enlarged prostate bulges into the bladder base. These findings are stable. Stomach/Bowel: Normal stomach. Small bowel and  colon are normal in caliber. No wall thickening. No inflammation. Mild generalized increase in the colonic stool burden. Vascular/Lymphatic: Aorta is tortuous with diffuse atherosclerosis. Is dilated to a maximum of 3.4 cm infrarenal, unchanged. No enlarged lymph nodes. Reproductive: Marked enlargement of prostate, 7.6 x 6.7 x 9.8 cm, unchanged. Other: No hernia.  No ascites. Musculoskeletal: Chronic bilateral pars defects at L5-S1 with a grade 1 to grade 2 anterolisthesis. No acute fracture. No bone lesion. Skeletal structures are demineralized. Advanced degenerative changes noted of the visualized spine. IMPRESSION: 1. No acute findings within the abdomen or pelvis. 2. Multiple stable chronic findings including marked prostate hypertrophy and bladder changes consistent with chronic bladder outlet obstruction. 3. Abdominal aortic aneurysm, 3.4 cm, stable from the recent prior CT. Recommend follow-up ultrasound every 3 years. This recommendation follows ACR consensus guidelines: White Paper of the ACR Incidental Findings Committee II on Vascular Findings. J Am Coll Radiol 2013;  10:789-794. 4. Mild generalized increase in the colonic stool burden. Electronically Signed   By: Lajean Manes M.D.   On: 02/26/2022 15:38   DG Chest 2 View  Result Date: 02/26/2022 CLINICAL DATA:  Trauma, fall EXAM: CHEST - 2 VIEW COMPARISON:  Previous studies including the examination done on 12/13/2015 FINDINGS: Transverse diameter of heart is slightly increased. Central pulmonary vessels are prominent. There are no signs of alveolar pulmonary edema. Left hemidiaphragm is elevated. There is crowding of markings in the left lower lung fields. There is blunting of left lateral CP angle. IMPRESSION: Linear densities in the left lower lung fields may suggest crowding of markings due to poor inspiration or subsegmental atelectasis. Blunting of left lateral CP angle may be due to small effusion or pleural thickening. Electronically Signed   By: Elmer Picker M.D.   On: 02/26/2022 13:18   CT HEAD WO CONTRAST  Result Date: 02/26/2022 CLINICAL DATA:  Altered mental status EXAM: CT HEAD WITHOUT CONTRAST TECHNIQUE: Contiguous axial images were obtained from the base of the skull through the vertex without intravenous contrast. RADIATION DOSE REDUCTION: This exam was performed according to the departmental dose-optimization program which includes automated exposure control, adjustment of the mA and/or kV according to patient size and/or use of iterative reconstruction technique. COMPARISON:  06/02/2019 FINDINGS: There is patient motion in many of the images limiting the study. Brain: No acute intracranial findings are seen. There are no signs of intracranial bleeding. Cortical sulci are prominent. There is decreased density in periventricular and subcortical white matter. Vascular: Unremarkable. Skull: No displaced fractures are seen. Sinuses/Orbits: Unremarkable. Other: None IMPRESSION: Motion limited study. As far as seen, no acute intracranial findings are noted. Atrophy. Small-vessel disease.  Electronically Signed   By: Elmer Picker M.D.   On: 02/26/2022 15:35   MR BRAIN WO CONTRAST  Result Date: 02/28/2022 CLINICAL DATA:  Mental status change. EXAM: MRI HEAD WITHOUT CONTRAST TECHNIQUE: Multiplanar, multiecho pulse sequences of the brain and surrounding structures were obtained without intravenous contrast. COMPARISON:  None Available. FINDINGS: Brain: No acute infarct, mass effect or extra-axial collection. Chronic Microhemorrhage in the left frontal operculum. There is multifocal hyperintense T2-weighted signal within the white matter. Generalized cerebral volume loss. The midline structures are normal. Vascular: Major flow voids are preserved. Skull and upper cervical spine: Normal calvarium and skull base. Visualized upper cervical spine and soft tissues are normal. Sinuses/Orbits:No paranasal sinus fluid levels or advanced mucosal thickening. No mastoid or middle ear effusion. Normal orbits. IMPRESSION: 1. No acute intracranial abnormality. 2. Generalized cerebral volume loss and  chronic small vessel disease. Electronically Signed   By: Ulyses Jarred M.D.   On: 02/28/2022 22:30   MR PELVIS W WO CONTRAST  Result Date: 02/28/2022 CLINICAL DATA:  Orchitis and epididymitis. Evaluate possible lesion at the base of the urethra on CT (per urology note). EXAM: MRI PELVIS WITHOUT AND WITH CONTRAST TECHNIQUE: Multiplanar multisequence MR imaging of the pelvis was performed both before and after administration of intravenous contrast. CONTRAST:  47mL GADAVIST GADOBUTROL 1 MMOL/ML IV SOLN COMPARISON:  Scrotal ultrasound dated 02/27/2022. CT abdomen/pelvis dated 02/26/2022. FINDINGS: Markedly limited evaluation due to patient motion. Urinary Tract: Thick-walled bladder, decompressed by an indwelling Foley catheter. Bowel:  Visualized bowel is grossly unremarkable. Vascular/Lymphatic: No evidence of aneurysm. No suspicious pelvic lymphadenopathy. Reproductive: Prostatomegaly, with marked enlargement  of the central gland indenting the base of the bladder, reflecting BPH. Right scrotal hydrocele (series 9/image 18), poorly visualized. No abnormality of the penile urethra. Specifically, no findings suspicious for periurethral abscess on MR. Other:  Small volume pelvic ascites. Musculoskeletal: Degenerative changes of the lumbar spine, with grade 1 anterolisthesis of L5 on S1. IMPRESSION: Markedly limited evaluation due to patient motion. No findings suspicious for periurethral abscess on MR. Right scrotal hydrocele, poorly visualized, better evaluated recent scrotal ultrasound. Marked BPH. Thick-walled bladder, decompressed by an indwelling Foley catheter. Electronically Signed   By: Julian Hy M.D.   On: 02/28/2022 21:43   DG Knee Complete 4 Views Left  Result Date: 02/26/2022 CLINICAL DATA:  Trauma, fall EXAM: LEFT KNEE - COMPLETE 4+ VIEW COMPARISON:  None FINDINGS: No recent fracture or dislocation is seen. There is 3 mm linear smooth marginated calcification anterior to the patella, possibly soft tissue calcification from previous injury. There is no significant effusion in the left knee. Tiny bony spurs seen in the patella and medial compartment. Arterial calcifications are seen in the soft tissues. IMPRESSION: No recent fracture or dislocation is seen. Degenerative changes with minimal bony spurs are noted in the medial and patellofemoral compartments. Electronically Signed   By: Elmer Picker M.D.   On: 02/26/2022 13:20   DG Abd Portable 1V  Result Date: 03/01/2022 CLINICAL DATA:  Feeding tube placement. EXAM: PORTABLE ABDOMEN - 1 VIEW COMPARISON:  CT exam on 02/26/2022 FINDINGS: Interval placement of feeding tube, tip overlying the level of the distal stomach or proximal duodenum. LEFT pleural effusion suspected. Visualized bowel gas pattern is nonobstructive. No evidence for free intraperitoneal air. IMPRESSION: Feeding tube tip overlies the distal stomach/duodenum. Electronically  Signed   By: Nolon Nations M.D.   On: 03/01/2022 16:15   EEG adult  Result Date: 02/27/2022 Lora Havens, MD     02/27/2022 11:49 AM Patient Name: ARWIN HAAG MRN: [id number] Epilepsy Attending: Lora Havens Referring Physician/Provider: Jonetta Osgood, MD Date: 02/27/2022 Duration: 22.05 mins Patient history: 88 year old male with altered mental status.  EEG to evaluate for seizure. Level of alertness: Awake AEDs during EEG study: None Technical aspects: This EEG study was done with scalp electrodes positioned according to the 10-20 International system of electrode placement. Electrical activity was acquired at a sampling rate of 500Hz  and reviewed with a high frequency filter of 70Hz  and a low frequency filter of 1Hz . EEG data were recorded continuously and digitally stored. Description: No clear posterior dominant rhythm was seen.  EEG showed continuous generalized 3 to 5 Hz theta-delta slowing.  Hyperventilation and photic stimulation were not performed.   ABNORMALITY - Continuous slow, generalized IMPRESSION: This study is suggestive of moderate  diffuse encephalopathy, nonspecific etiology. No seizures or epileptiform discharges were seen throughout the recording. Lora Havens   ECHOCARDIOGRAM COMPLETE  Result Date: 03/06/2022    ECHOCARDIOGRAM REPORT   Patient Name:   TOLGA VOLKMAN [facility] Date of Exam: 03/06/2022 Medical Rec #:  [id number]       Height:       65.0 in Accession #:    [id number]      Weight:       142.4 lb Date of Birth:  January 21, 1934       BSA:          1.712 m Patient Age:    29 years        BP:           161/57 mmHg Patient Gender: M               HR:           82 bpm. Exam Location:  Inpatient Procedure: 2D Echo, Cardiac Doppler and Color Doppler Indications:    CHF-Acute Systolic [id number]  History:        Patient has prior history of Echocardiogram examinations, most                 recent 12/13/2015. Signs/Symptoms:Altered mental status; Risk                  Factors:Hypertension. Nonspecific telemetry and EKG changes.                 abdominal Aortic aneurysm.  Sonographer:    Darlina Sicilian RDCS Referring Phys: Arnell Asal Margaree Mackintosh Barada  1. Left ventricular ejection fraction, by estimation, is 50 to 55%. The left ventricle has low normal function. The left ventricle has no regional wall motion abnormalities. There is moderate concentric left ventricular hypertrophy. Left ventricular diastolic parameters are consistent with Grade I diastolic dysfunction (impaired relaxation).  2. Right ventricular systolic function is normal. The right ventricular size is normal. There is normal pulmonary artery systolic pressure. The estimated right ventricular systolic pressure is 123XX123 mmHg.  3. Left atrial size was mildly dilated.  4. The mitral valve is degenerative. Trivial mitral valve regurgitation. Moderate mitral annular calcification.  5. The aortic valve is tricuspid. There is moderate calcification of the aortic valve. There is moderate thickening of the aortic valve. Aortic valve regurgitation is mild. Aortic valve sclerosis/calcification is present, without any evidence of aortic stenosis.  6. Aortic dilatation noted. There is borderline dilatation of the aortic root, measuring 36 mm.  7. The inferior vena cava is normal in size with <50% respiratory variability, suggesting right atrial pressure of 8 mmHg. Comparison(s): Compared to prior TTE report in 2017, the LVEF appears slightly less at 50-55% (previously 55-60%). Otherwise, there is no significant change. FINDINGS  Left Ventricle: Left ventricular ejection fraction, by estimation, is 50 to 55%. The left ventricle has low normal function. The left ventricle has no regional wall motion abnormalities. The left ventricular internal cavity size was normal in size. There is moderate concentric left ventricular hypertrophy. Left ventricular diastolic parameters are consistent with Grade I diastolic dysfunction  (impaired relaxation). Right Ventricle: The right ventricular size is normal. No increase in right ventricular wall thickness. Right ventricular systolic function is normal. There is normal pulmonary artery systolic pressure. The tricuspid regurgitant velocity is 2.53 m/s, and  with an assumed right atrial pressure of 8 mmHg, the estimated right ventricular systolic pressure is 123XX123 mmHg. Left Atrium: Left atrial size was  mildly dilated. Right Atrium: Right atrial size was normal in size. Pericardium: There is no evidence of pericardial effusion. Mitral Valve: The mitral valve is degenerative in appearance. There is moderate thickening of the mitral valve leaflet(s). There is mild calcification of the mitral valve leaflet(s). Moderate mitral annular calcification. Trivial mitral valve regurgitation. Tricuspid Valve: The tricuspid valve is normal in structure. Tricuspid valve regurgitation is trivial. Aortic Valve: The aortic valve is tricuspid. There is moderate calcification of the aortic valve. There is moderate thickening of the aortic valve. Aortic valve regurgitation is mild. Aortic valve sclerosis/calcification is present, without any evidence of aortic stenosis. Pulmonic Valve: The pulmonic valve was normal in structure. Pulmonic valve regurgitation is trivial. Aorta: Aortic dilatation noted. There is borderline dilatation of the aortic root, measuring 36 mm. Venous: The inferior vena cava is normal in size with less than 50% respiratory variability, suggesting right atrial pressure of 8 mmHg. IAS/Shunts: The atrial septum is grossly normal.  LEFT VENTRICLE PLAX 2D LVIDd:         4.20 cm   Diastology LVIDs:         3.10 cm   LV e' medial:    5.34 cm/s LV PW:         1.20 cm   LV E/e' medial:  11.7 LV IVS:        1.40 cm   LV e' lateral:   4.12 cm/s LVOT diam:     1.90 cm   LV E/e' lateral: 15.2 LV SV:         49 LV SV Index:   29 LVOT Area:     2.84 cm  RIGHT VENTRICLE RV Basal diam:  3.90 cm RV Mid diam:     2.20 cm RV S prime:     17.70 cm/s TAPSE (M-mode): 2.6 cm LEFT ATRIUM             Index        RIGHT ATRIUM           Index LA diam:        3.20 cm 1.87 cm/m   RA Area:     12.90 cm LA Vol (A2C):   40.0 ml 23.33 ml/m  RA Volume:   25.10 ml  14.66 ml/m LA Vol (A4C):   38.1 ml 22.22 ml/m LA Biplane Vol: 50.8 ml 29.67 ml/m  AORTIC VALVE LVOT Vmax:   92.90 cm/s LVOT Vmean:  66.800 cm/s LVOT VTI:    0.173 m  AORTA Ao Root diam: 3.60 cm Ao Asc diam:  3.20 cm MITRAL VALVE               TRICUSPID VALVE MV Area (PHT): 3.20 cm    TR Peak grad:   25.6 mmHg MV Decel Time: 237 msec    TR Vmax:        253.00 cm/s MV E velocity: 62.70 cm/s MV A velocity: 97.90 cm/s  SHUNTS MV E/A ratio:  0.64        Systemic VTI:  0.17 m                            Systemic Diam: 1.90 cm Gwyndolyn Kaufman MD Electronically signed by Gwyndolyn Kaufman MD Signature Date/Time: 03/06/2022/1:42:14 PM    Final    US SCROTUM W/DOPPLER  Result Date: 02/27/2022 CLINICAL DATA:  Orchitis and epididymitis EXAM: SCROTAL ULTRASOUND DOPPLER ULTRASOUND OF THE TESTICLES TECHNIQUE: Complete ultrasound examination of the  testicles, epididymis, and other scrotal structures was performed. Color and spectral Doppler ultrasound were also utilized to evaluate blood flow to the testicles. COMPARISON:  02/16/2022 and 01/27/2022 FINDINGS: Right testicle Measurements: 4.8 x 1.8 x 3.5 cm. Mass effect on the right testis due to the right scrotal collection (described above), grossly unchanged. No mass or microlithiasis visualized. Left testicle Measurements: 4.4 x 2.5 x 2.7 cm. Stable heterogeneous parenchymal appearance. No mass or microlithiasis visualized. Right epididymis:  Poorly visualized due to mass effect. Left epididymis:  Normal in size and appearance. Hydrocele: Present bilaterally, right greater than left. Septated right scrotal collection measures 7.2 x 3.5 x 6.1 cm, previously 7.3 x 4.4 x 5.1 cm, grossly unchanged. Varicocele:  None visualized. Pulsed  Doppler interrogation of both testes demonstrates a normal low resistance arterial and venous waveforms on the left, with a sluggish/diminished arterial peak on the right secondary to mass effect, although patent. IMPRESSION: No evidence of testicular torsion. Stable complex right hydrocele with mass effect on the right testis. Electronically Signed   By: Julian Hy M.D.   On: 02/27/2022 02:39   US SCROTUM W/DOPPLER  Addendum Date: 02/16/2022   ADDENDUM REPORT: 02/16/2022 10:28 ADDENDUM: Mass effect with resultant early vascular compromise/increased vascular resistance involving the RIGHT testis secondary to enlargement of RIGHT scrotal collection was discussed with the provider as outlined below. Critical Value/emergent results were called by telephone at the time of interpretation on 02/16/2022 at 10:28 am to provider [facility] , who verbally acknowledged these results. Electronically Signed   By: Zetta Bills M.D.   On: 02/16/2022 10:28   Result Date: 02/16/2022 CLINICAL DATA:  Testicular pain with worsening since prior imaging. EXAM: SCROTAL ULTRASOUND DOPPLER ULTRASOUND OF THE TESTICLES TECHNIQUE: Complete ultrasound examination of the testicles, epididymis, and other scrotal structures was performed. Color and spectral Doppler ultrasound were also utilized to evaluate blood flow to the testicles. COMPARISON:  Comparison made with imaging from January 27, 2022 FINDINGS: Right testicle Measurements: 6.1 x 2.0 x 1.9 cm. Heterogeneous testis which is elongated and suffers from mass effect from an adjacent complex hydrocele such that the border along the margin with the hydrocele and the testis is straight implying mass effect and altering the normal shape of the RIGHT testis. Venous and arterial flow can be documented within the testis though with tardus parvus waveform of the testis exhibited and some increased resistance as compared to previous imaging within the testis. Left testicle Measurements:  4.7 x 2.3 x 2.6 cm. Heterogeneity of the LEFT testis is very similar to the prior exam. No focal mass lesion. Right epididymis:  Not well visualized. Left epididymis: Potential tiny cyst in the LEFT epididymis. Heterogeneous appearance of the LEFT epididymis. Hydrocele: Increase in size and resultant mass-effect upon the RIGHT testis. Hydroceles shows septations and some internal debris. Scrotal collection measuring 7.3 x 4.4 x 5.1 cm, markedly increased in size compared to previous imaging. Varicocele:  None visualized. Pulsed Doppler interrogation of both testes demonstrates tardus parvus waveform on the RIGHT with preserved venous and arterial flow but with increased vascular resistance due to testicular compression. LEFT testis with normal low resistance arterial and venous waveforms. IMPRESSION: Findings of orchitis seen on the prior study have progressed with marked enlargement of a complex RIGHT hydrocele, potentially pyocele of the RIGHT scrotum which shows mass effect upon the RIGHT testis such that there is a tardus parvus waveform and early vascular compromise. Mass effect exhibited by flattening/elongation of the testis and straightening of the  boundary between the testicular/scrotal collection interface. Urologic consultation is suggested. LEFT testis with heterogeneity favored to be related to prior orchitis. Consider 6-8 week follow-up to ensure stability. Electronically Signed: By: Zetta Bills M.D. On: 02/16/2022 10:23   DG Hips Bilat W or Wo Pelvis 3-4 Views  Result Date: 02/26/2022 CLINICAL DATA:  Trauma, fall EXAM: DG HIP (WITH OR WITHOUT PELVIS) 3-4V BILAT COMPARISON:  None Available. FINDINGS: No recent fracture or dislocation is seen. There is no significant narrowing of joint space in both hips. Arterial calcifications are seen in the soft tissues. Degenerative changes are noted in the visualized lower lumbar spine. IMPRESSION: No recent fracture or dislocation is seen in both hips.  Lumbar spondylosis. Electronically Signed   By: Elmer Picker M.D.   On: 02/26/2022 13:22   VAS [place] LOWER EXTREMITY VENOUS (DVT)  Result Date: 02/27/2022  Lower Venous DVT Study Patient Name:  CYAN DRAKOS [facility]  Date of Exam:   02/27/2022 Medical Rec #: [id number]        Accession #:    [id number] Date of Birth: Feb 27, 1934        Patient Gender: M Patient Age:   44 years Exam Location:  [facility] Procedure:      VAS [place] LOWER EXTREMITY VENOUS (DVT) Referring Phys: Jennette Kettle --------------------------------------------------------------------------------  Indications: Pain.  Risk Factors: Orchitis and epididymitis for the past month. Limitations: Movement, non compliant. Comparison Study: No prior study on file Performing Technologist: Sharion Dove RVS  Examination Guidelines: A complete evaluation includes B-mode imaging, spectral Doppler, color Doppler, and power Doppler as needed of all accessible portions of each vessel. Bilateral testing is considered an integral part of a complete examination. Limited examinations for reoccurring indications may be performed as noted. The reflux portion of the exam is performed with the patient in reverse Trendelenburg.  +---------+---------------+---------+-----------+----------+--------------+ RIGHT    CompressibilityPhasicitySpontaneityPropertiesThrombus Aging +---------+---------------+---------+-----------+----------+--------------+ CFV      Full           Yes      Yes                                 +---------+---------------+---------+-----------+----------+--------------+ SFJ      Full                                                        +---------+---------------+---------+-----------+----------+--------------+ FV Prox  Full                                                        +---------+---------------+---------+-----------+----------+--------------+ FV Mid   Full                                                         +---------+---------------+---------+-----------+----------+--------------+ FV DistalFull                                                        +---------+---------------+---------+-----------+----------+--------------+  PFV      Full                                                        +---------+---------------+---------+-----------+----------+--------------+ POP      Full           Yes      Yes                                 +---------+---------------+---------+-----------+----------+--------------+ PTV      Full                                                        +---------+---------------+---------+-----------+----------+--------------+ PERO     Full                                                        +---------+---------------+---------+-----------+----------+--------------+   +---------+---------------+---------+-----------+----------+--------------+ LEFT     CompressibilityPhasicitySpontaneityPropertiesThrombus Aging +---------+---------------+---------+-----------+----------+--------------+ CFV      Full           Yes      Yes                                 +---------+---------------+---------+-----------+----------+--------------+ SFJ      Full                                                        +---------+---------------+---------+-----------+----------+--------------+ FV Prox  Full                                                        +---------+---------------+---------+-----------+----------+--------------+ FV Mid   Full                                                        +---------+---------------+---------+-----------+----------+--------------+ FV DistalFull                                                        +---------+---------------+---------+-----------+----------+--------------+ PFV      Full                                                         +---------+---------------+---------+-----------+----------+--------------+  POP                     Yes      Yes                                 +---------+---------------+---------+-----------+----------+--------------+ PTV      Full                                                        +---------+---------------+---------+-----------+----------+--------------+ PERO     Full                                                        +---------+---------------+---------+-----------+----------+--------------+     Summary: BILATERAL: - No evidence of deep vein thrombosis seen in the lower extremities, bilaterally. -No evidence of popliteal cyst, bilaterally.   *See table(s) above for measurements and observations. Electronically signed by Deitra Mayo MD on 02/27/2022 at 4:22:05 PM.    Final     Today   Subjective    Guerry Bruin today has no headache,no chest abdominal pain,no new weakness tingling or numbness, feels much better     Objective   Blood pressure 125/60, pulse (!) 51, temperature (!) 97.4 F (36.3 C), temperature source Oral, resp. rate 18, height 5\' 5"  (1.651 m), weight 64.6 kg, SpO2 95 %.   Intake/Output Summary (Last 24 hours) at 03/07/2022 0958 Last data filed at 03/07/2022 0014 Gross per 24 hour  Intake --  Output 1400 ml  Net -1400 ml    Exam  Awake but mildly confused, No new F.N deficits,    [place].AT,PERRAL Supple Neck,   Symmetrical Chest wall movement, Good air movement bilaterally, CTAB RRR,No Gallops,   +ve B.Sounds, Abd Soft, Non tender, foley in place No Cyanosis, Clubbing or edema    Data Review   Recent Labs  Lab 03/01/22 0125 03/02/22 0517 03/02/22 0709 03/03/22 0539 03/04/22 0106 03/05/22 0624 03/06/22 0153 03/07/22 0218  WBC 7.6   < >  --  8.2 5.4 4.1 4.4 4.1  HGB 13.0   < >  --  13.1 12.2* 11.8* 11.9* 12.0*  HCT 40.2   < >  --  38.5* 37.5* 35.0* 35.1* 35.2*  PLT 73*  74*   < >  --  128* 136* 154 184 195  MCV 88.5    < >  --  85.4 86.6 85.8 85.2 84.2  MCH 28.6   < >  --  29.0 28.2 28.9 28.9 28.7  MCHC 32.3   < >  --  34.0 32.5 33.7 33.9 34.1  RDW 14.8   < >  --  14.8 14.8 14.8 14.7 14.8  LYMPHSABS 5.6*  --  10.6*  --   --   --  2.6 2.3  MONOABS 0.4  --  0.5  --   --   --  0.4 0.4  EOSABS 0.0  --  0.0  --   --   --  0.1 0.1  BASOSABS 0.2*  --  0.1  --   --   --  0.0 0.0   < > =  values in this interval not displayed.    Recent Labs  Lab 03/01/22 0125 03/01/22 0125 03/01/22 1647 03/02/22 0517 03/02/22 1659 03/03/22 0539 03/05/22 0624 03/06/22 0153 03/07/22 0218  NA 143  --   --  142  --  140 139 138 136  K 4.1  --   --  3.9  --  3.9 3.7 3.7 3.8  CL 116*  --   --  114*  --  108 104 105 104  CO2 21*  --   --  25  --  28 30 29 26   GLUCOSE 78  --   --  119*  --  177* 88 96 89  BUN 27*  --   --  24*  --  19 15 17 23   CREATININE 0.98  --   --  0.81  --  0.70 0.66 0.73 0.74  CALCIUM 8.8*  --   --  8.6*  --  8.6* 8.6* 8.7* 8.6*  AST 40  --   --  39  --  32  --  51* 40  ALT 25  --   --  24  --  19  --  32 31  ALKPHOS 47  --   --  44  --  56  --  47 52  BILITOT 0.2*  --   --  0.6  --  0.5  --  0.8 0.8  ALBUMIN 2.0*  --   --  2.0*  --  2.1*  --  2.3* 2.3*  MG  --    < > 2.2 2.1 2.0 2.0 2.2 2.2 2.3  PHOS  --   --  1.9* 1.9* 2.9 2.7  --   --   --   DDIMER 9.29*  --   --   --   --   --   --   --   --   INR 1.7*  --   --   --   --   --   --   --   --   BNP  --   --   --   --   --   --   --  529.7* 127.4*   < > = values in this interval not displayed.    Total Time in preparing paper work, data evaluation and todays exam - 83 minutes  Lala Lund M.D on 03/07/2022 at 9:58 AM  [facility]

## 2022-03-07 NOTE — Discharge Instructions (Signed)
Follow with Primary MD  in 7 days   Get CBC, CMP, 2 view Chest X ray -  checked next visit within 1 week by Primary MD or SNF MD    Activity: As tolerated with Full fall precautions use walker/cane & assistance as needed  Disposition SNF  Diet: Dysphagia 1 diet with feeding assistance and aspiration precautions.  Special Instructions: If you have smoked or chewed Tobacco  in the last 2 yrs please stop smoking, stop any regular Alcohol  and or any Recreational drug use.  On your next visit with your primary care physician please Get Medicines reviewed and adjusted.  Please request your Prim.MD to go over all Hospital Tests and Procedure/Radiological results at the follow up, please get all Hospital records sent to your Prim MD by signing hospital release before you go home.  If you experience worsening of your admission symptoms, develop shortness of breath, life threatening emergency, suicidal or homicidal thoughts you must seek medical attention immediately by calling 911 or calling your MD immediately  if symptoms less severe.  You Must read complete instructions/literature along with all the possible adverse reactions/side effects for all the Medicines you take and that have been prescribed to you. Take any new Medicines after you have completely understood and accpet all the possible adverse reactions/side effects.

## 2022-03-07 NOTE — Progress Notes (Addendum)
Physical Therapy Treatment Patient Details Name: Kenneth Woods MRN: [id number] DOB: 1934/07/08 Today's Date: 03/07/2022   History of Present Illness 88 yo male with onset of painful perineum after battling orchitis and epididymitis was admitted 5/14 and now is less painful but still more confused than previously.  Dx with acute encephalitis, ongoing ABT for his testicular infection.  PMHx:  BPH, HTN, COPD, OA, HLD, back pain, R UE fracture,    PT Comments    Patient seen in co-treatment with OT due to confusion/potential for agitation. Patient noted to be lethargic with poor ability to participate. Eyes closed majority of session, however did initiate lying down when instructed to do so. RN notified of ?leaking foley catheter as bed linens were wet (and changed when pt briefly stood).     Recommendations for follow up therapy are one component of a multi-disciplinary discharge planning process, led by the attending physician.  Recommendations may be updated based on patient status, additional functional criteria and insurance authorization.  Follow Up Recommendations  Skilled nursing-short term rehab (<3 hours/day)     Assistance Recommended at Discharge Frequent or constant Supervision/Assistance  Patient can return home with the following Two people to help with walking and/or transfers;Two people to help with bathing/dressing/bathroom;Assistance with cooking/housework;Direct supervision/assist for medications management;Direct supervision/assist for financial management;Assist for transportation;Help with stairs or ramp for entrance   Equipment Recommendations  None recommended by PT    Recommendations for Other Services       Precautions / Restrictions Precautions Precautions: Fall Restrictions Weight Bearing Restrictions: No     Mobility  Bed Mobility Overal bed mobility: Needs Assistance Bed Mobility: Rolling, Sidelying to Sit, Sit to Supine Rolling: Total  assist Sidelying to sit: Total assist, +2 for physical assistance, +2 for safety/equipment   Sit to supine: Max assist, +2 for physical assistance, +2 for safety/equipment   General bed mobility comments: Pt lethargy impacting ability to participate in bed mobility; when instructed to lie back down, pt moved shoulders toward pillow and began lifting legs up onto bed    Transfers Overall transfer level: Needs assistance Equipment used: 2 person hand held assist Transfers: Sit to/from Stand Sit to Stand: Max assist, +2 physical assistance, +2 safety/equipment           General transfer comment: counted and rocked for cueing, Pt with minor adjustment of leg placement to facilitate transfer, but dependent on support from bed and therapists    Ambulation/Gait                   Stairs             Wheelchair Mobility    Modified Rankin (Stroke Patients Only)       Balance Overall balance assessment: Needs assistance Sitting-balance support: Bilateral upper extremity supported, Feet supported Sitting balance-Leahy Scale: Poor Sitting balance - Comments: brief moments of min guard Postural control: Posterior lean Standing balance support: Bilateral upper extremity supported, During functional activity Standing balance-Leahy Scale: Zero Standing balance comment: dependent on therapist support                            Cognition Arousal/Alertness: Lethargic Behavior During Therapy: Flat affect Overall Cognitive Status: Impaired/Different from baseline                                 General Comments: Pt very lethargic today,  stated name was "wild bill" otherwise did not verbalize or open eyes throughout session        Exercises      General Comments        Pertinent Vitals/Pain Pain Assessment Pain Assessment: Faces Faces Pain Scale: Hurts a little bit Pain Location: LLE with movement Pain Descriptors / Indicators:  Discomfort, Grimacing, Tender Pain Intervention(s): Monitored during session    Home Living                          Prior Function            PT Goals (current goals can now be found in the care plan section) Acute Rehab PT Goals Patient Stated Goal: none stated; confused Time For Goal Achievement: 03/17/22 Potential to Achieve Goals: Good Progress towards PT goals: Not progressing toward goals - comment (more lethargic; RN reports recently wide awake)    Frequency    Min 2X/week      PT Plan Current plan remains appropriate    Co-evaluation PT/OT/SLP Co-Evaluation/Treatment: Yes Reason for Co-Treatment: Complexity of the patient's impairments (multi-system involvement);Necessary to address cognition/behavior during functional activity PT goals addressed during session: Mobility/safety with mobility;Balance OT goals addressed during session: ADL's and self-care;Strengthening/ROM      AM-PAC PT "6 Clicks" Mobility   Outcome Measure  Help needed turning from your back to your side while in a flat bed without using bedrails?: A Lot Help needed moving from lying on your back to sitting on the side of a flat bed without using bedrails?: Total Help needed moving to and from a bed to a chair (including a wheelchair)?: Total Help needed standing up from a chair using your arms (e.g., wheelchair or bedside chair)?: Total Help needed to walk in hospital room?: Total Help needed climbing 3-5 steps with a railing? : Total 6 Click Score: 7    End of Session Equipment Utilized During Treatment: Gait belt Activity Tolerance: Patient limited by fatigue;Patient limited by lethargy Patient left: in bed;with call bell/phone within reach;with bed alarm set Nurse Communication: Mobility status;Other (comment) (?foley catheter leaking) PT Visit Diagnosis: Unsteadiness on feet (R26.81);Muscle weakness (generalized) (M62.81);Difficulty in walking, not elsewhere classified  (R26.2)     Time: 4782-9562 PT Time Calculation (min) (ACUTE ONLY): 19 min  Charges:    No charge (co-tx with OT)                     Kenneth Woods, PT Acute Rehabilitation Services  Pager [phone] Office [phone]    Kenneth Woods 03/07/2022, 10:04 AM

## 2022-03-08 DIAGNOSIS — M6281 Muscle weakness (generalized): Secondary | ICD-10-CM | POA: Diagnosis not present

## 2022-03-08 DIAGNOSIS — I10 Essential (primary) hypertension: Secondary | ICD-10-CM | POA: Diagnosis not present

## 2022-03-08 DIAGNOSIS — G9341 Metabolic encephalopathy: Secondary | ICD-10-CM | POA: Diagnosis not present

## 2022-03-08 DIAGNOSIS — R339 Retention of urine, unspecified: Secondary | ICD-10-CM | POA: Diagnosis not present

## 2022-03-11 ENCOUNTER — Emergency Department (HOSPITAL_COMMUNITY): Payer: Medicare Other

## 2022-03-11 ENCOUNTER — Other Ambulatory Visit: Payer: Self-pay

## 2022-03-11 ENCOUNTER — Encounter (HOSPITAL_COMMUNITY): Payer: Self-pay | Admitting: *Deleted

## 2022-03-11 ENCOUNTER — Emergency Department (HOSPITAL_COMMUNITY)
Admission: EM | Admit: 2022-03-11 | Discharge: 2022-03-11 | Disposition: A | Discharge status: Home / Self Care | Payer: Medicare Other | Attending: Emergency Medicine | Admitting: Emergency Medicine

## 2022-03-11 DIAGNOSIS — Z7982 Long term (current) use of aspirin: Secondary | ICD-10-CM | POA: Diagnosis not present

## 2022-03-11 DIAGNOSIS — J449 Chronic obstructive pulmonary disease, unspecified: Secondary | ICD-10-CM | POA: Diagnosis not present

## 2022-03-11 DIAGNOSIS — T83098D Other mechanical complication of other indwelling urethral catheter, subsequent encounter: Secondary | ICD-10-CM | POA: Diagnosis not present

## 2022-03-11 DIAGNOSIS — G8929 Other chronic pain: Secondary | ICD-10-CM | POA: Insufficient documentation

## 2022-03-11 DIAGNOSIS — I10 Essential (primary) hypertension: Secondary | ICD-10-CM | POA: Diagnosis not present

## 2022-03-11 DIAGNOSIS — T839XXD Unspecified complication of genitourinary prosthetic device, implant and graft, subsequent encounter: Secondary | ICD-10-CM

## 2022-03-11 DIAGNOSIS — T83098A Other mechanical complication of other indwelling urethral catheter, initial encounter: Secondary | ICD-10-CM | POA: Insufficient documentation

## 2022-03-11 DIAGNOSIS — M545 Low back pain, unspecified: Secondary | ICD-10-CM | POA: Diagnosis not present

## 2022-03-11 DIAGNOSIS — M549 Dorsalgia, unspecified: Secondary | ICD-10-CM | POA: Insufficient documentation

## 2022-03-11 DIAGNOSIS — T83091D Other mechanical complication of indwelling urethral catheter, subsequent encounter: Secondary | ICD-10-CM | POA: Diagnosis not present

## 2022-03-11 LAB — URINALYSIS, ROUTINE W REFLEX MICROSCOPIC
Bacteria, UA: NONE SEEN
Bilirubin Urine: NEGATIVE
Glucose, UA: NEGATIVE mg/dL
Ketones, ur: NEGATIVE mg/dL
Nitrite: NEGATIVE
Protein, ur: 30 mg/dL — AB
Specific Gravity, Urine: 1.012 (ref 1.005–1.030)
pH: 6 (ref 5.0–8.0)

## 2022-03-11 IMAGING — DX DG LUMBAR SPINE COMPLETE 4+V
5 series · 5 of 5 positions shown · non-contrast
Comparison: None Available.

CLINICAL DATA: Back pain

EXAM:
LUMBAR SPINE - COMPLETE 4+ VIEW

[l-spine obl (1 of 3)]
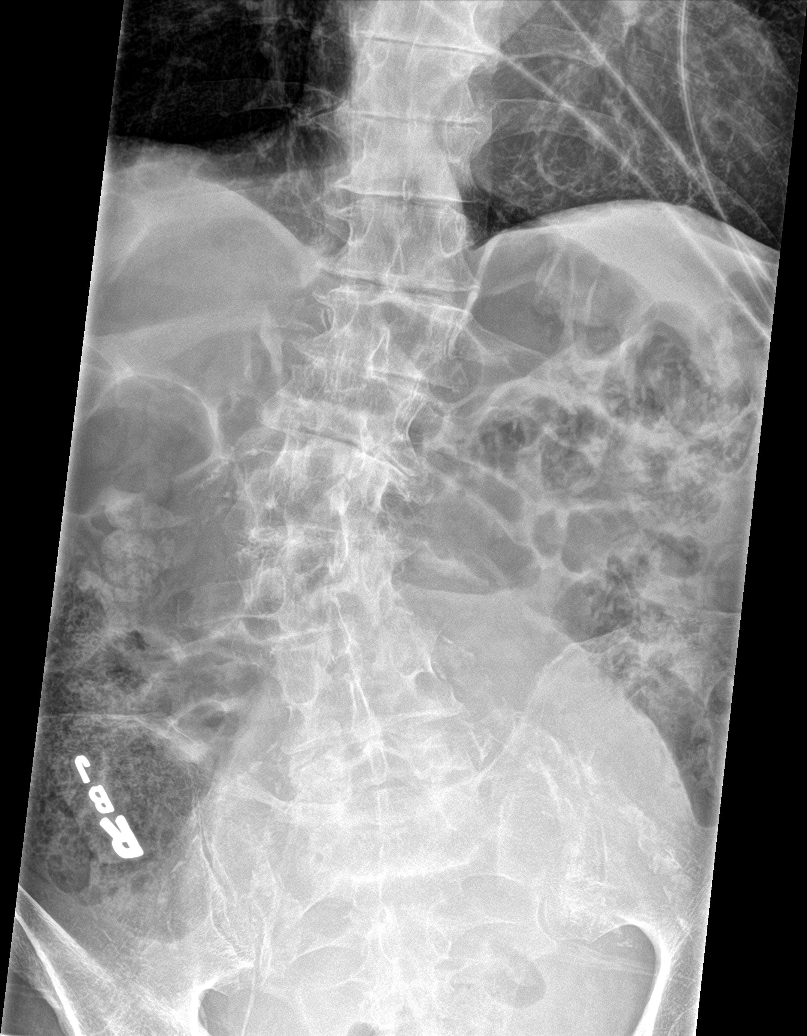

[l-spine obl (2 of 3)]
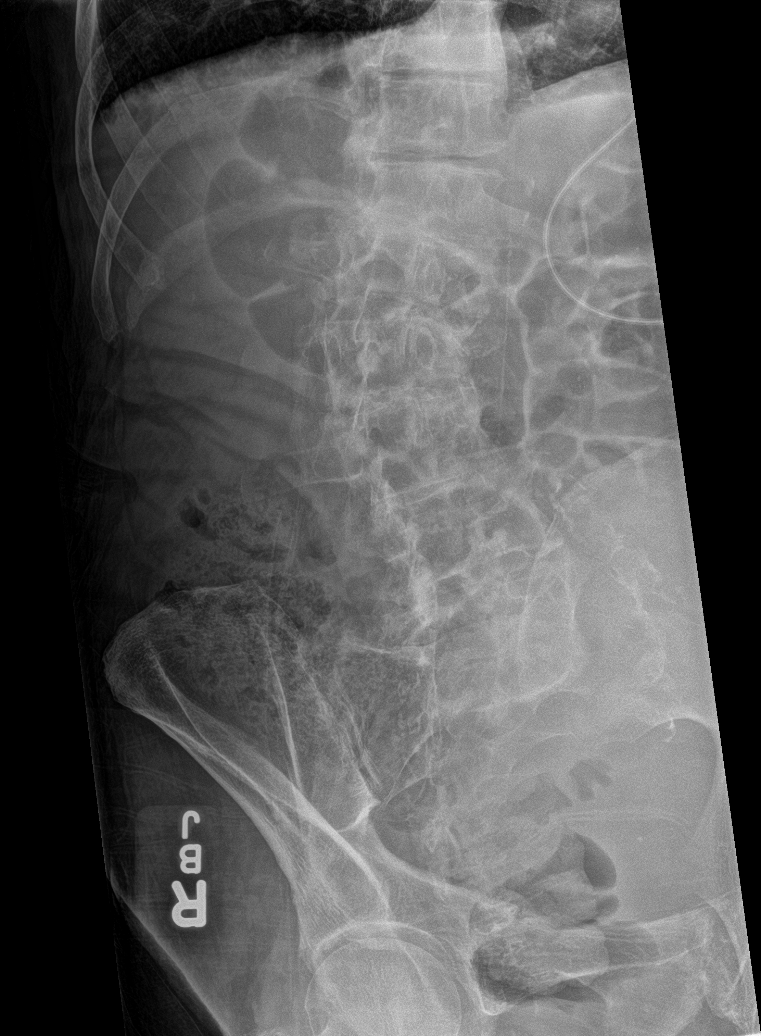

[l-spine lat]
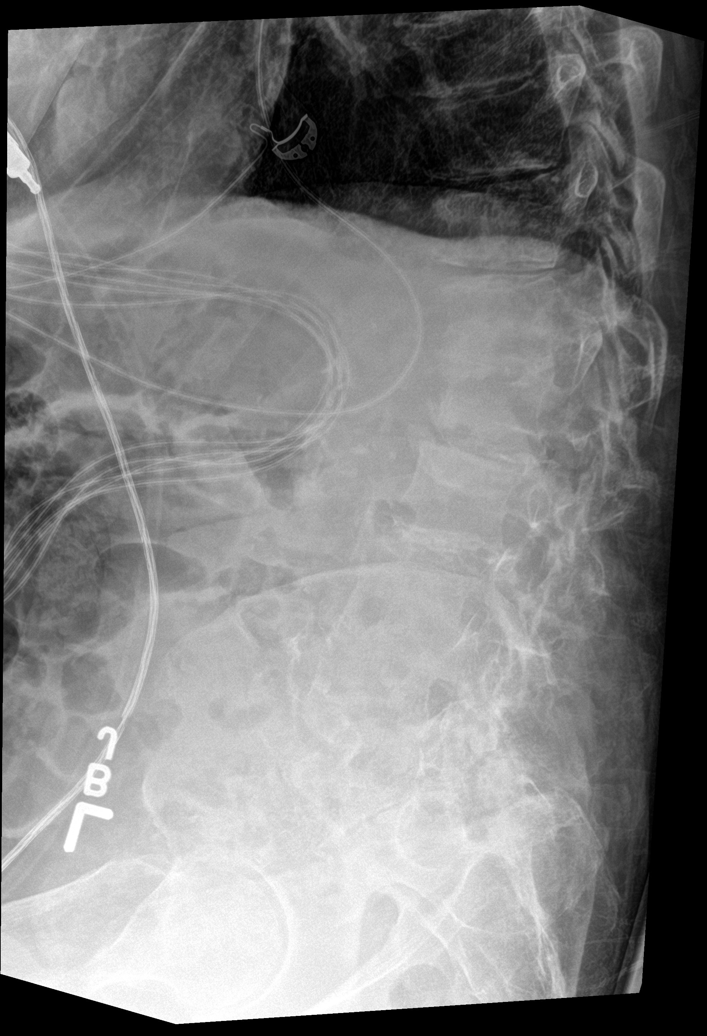

[l-spine spot]
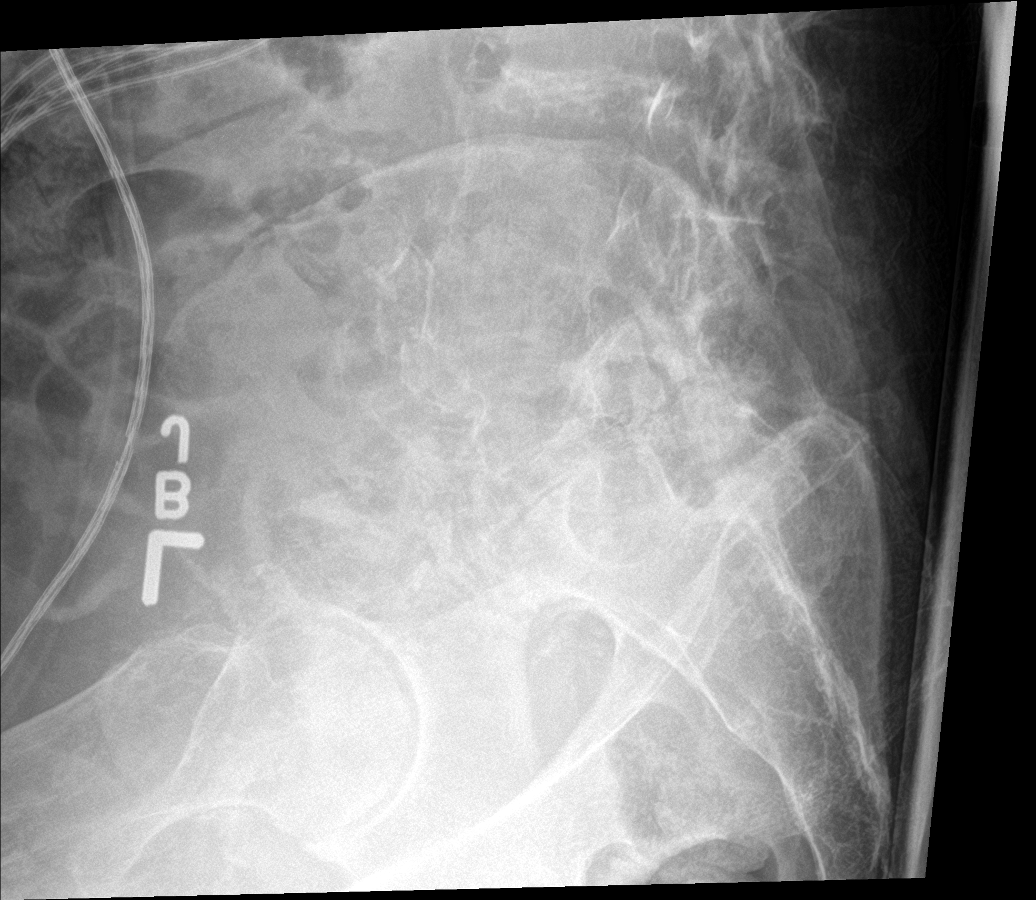

[l-spine obl (3 of 3)]
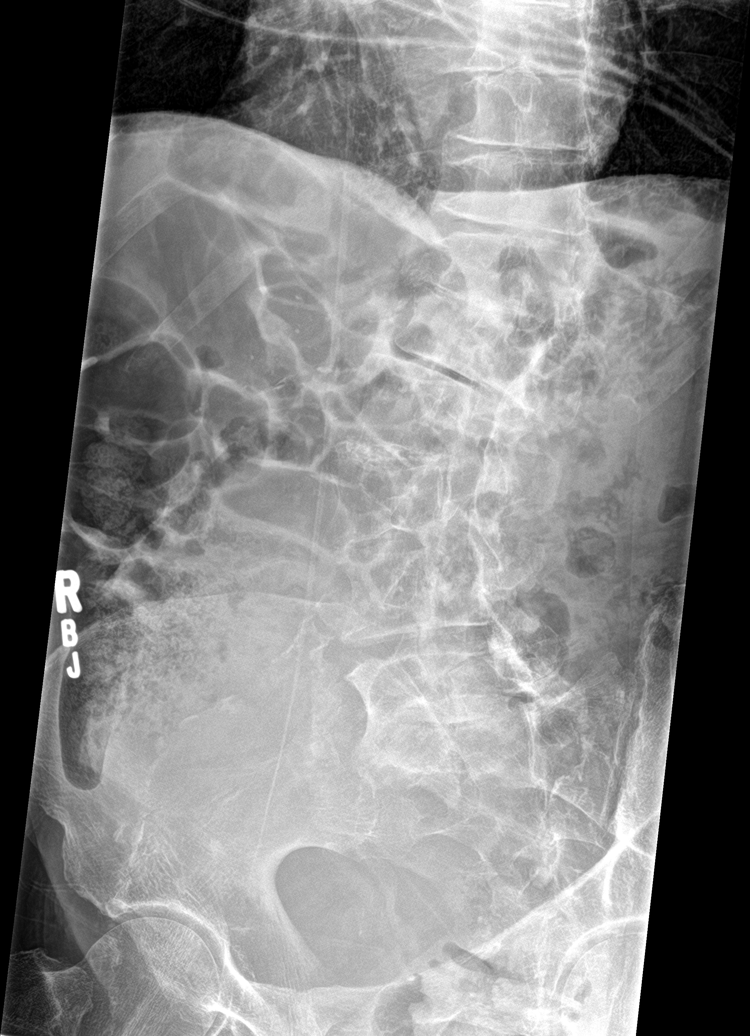

[5 of 5 positions shown; findings below may reference images not displayed]

FINDINGS: Dextroscoliosis is seen. No recent fracture is seen in the lumbar
spine. There is mild decrease in height of anterior aspect of body
of T11 vertebra, possibly old mild compression. Lateral views are
less than optimal related to dextroscoliosis. Degenerative changes
are noted with disc space narrowing and bony spurs at multiple
levels in the lumbar spine and lower thoracic spine. Arterial
calcifications are seen in the soft tissues.
IMPRESSION: No recent fracture is seen in the lumbar spine. Mild decrease in
height of anterior aspect of body of T11 vertebra may be residual
from previous injury. Lumbar spondylosis with disc space narrowing,
bony spurs and facet hypertrophy throughout lumbar spine and lower
thoracic spine. There is moderate dextroscoliosis in the lumbar
spine.

## 2022-03-11 NOTE — ED Notes (Signed)
Pt in radiology 

## 2022-03-11 NOTE — ED Notes (Signed)
Encouraged PO fluids, given 480cc water to drink. Pending urine return for sample.

## 2022-03-11 NOTE — ED Provider Notes (Signed)
[facility] EMERGENCY DEPARTMENT Provider Note   CSN: [id number] Arrival date & time: 03/11/22  1430     History  Chief Complaint  Patient presents with   foley catheter issues     Kenneth Woods is a 88 y.o. male.  HPI  Patient with medical history notable for chronic back pain, BPH, COPD, hypertension, hyperlipidemia, recent admission due to acute encephalopathy currently at [facility] in [place] presents today due to Foley catheter issues.  Patient was discharged from [facility] 4 days ago to the SNF due to acute encephalopathy.  Patient's son had not been able to see him at the SNF until today, when he arrived patient has a Foley catheter but there is no bag.  He was sitting in his own urine and son was concerned and brought him to ED for evaluation.  I spoke with the RN at [facility].  She was unaware the patient was brought to the ED.  She states patient gets confused and has been confused since arrival at the SNF.  He frequently removes  Foley bag, he is hard to redirect.  This has been baseline since he has arrived, he has not had any changes in his mental status good or bad.  They speculate any fevers, vomiting, falls, changes in medicine.  Patient denies any abdominal pain but does state he is back pain.  Per chart review patient has chronic back pain.   Home Medications Prior to Admission medications   Medication Sig Start Date End Date Taking? Authorizing Provider  acetaminophen (TYLENOL) 500 MG tablet Take 1,000 mg by mouth every 6 (six) hours as needed for mild pain.    [provider]  aspirin 81 MG chewable tablet Chew 1 tablet (81 mg total) by mouth daily. 03/07/22   Leroy Sea, MD  finasteride (PROSCAR) 5 MG tablet TAKE 1 TABLET (5 MG TOTAL) BY MOUTH DAILY. Patient taking differently: Take 5 mg by mouth daily. 11/15/15   Dorena Bodo, PA-C  folic acid (FOLVITE) 1 MG tablet Take 1 tablet (1 mg total) by mouth daily. 03/07/22   Leroy Sea,  MD  OVER THE COUNTER MEDICATION Take 2 capsules by mouth 2 (two) times daily. PROSTA-STRONG (saw palmetto,lycopene,pumpkin seed extract)    [provider]  rosuvastatin (CRESTOR) 10 MG tablet Take 1 tablet (10 mg total) by mouth daily. 03/07/22   Leroy Sea, MD  tamsulosin (FLOMAX) 0.4 MG CAPS capsule TAKE ONE CAPSULE BY MOUTH EVERY DAY AFTER SUPPER Patient taking differently: Take 0.4 mg by mouth daily after supper. 06/18/15   Dorena Bodo, PA-C  thiamine 100 MG tablet Take 1 tablet (100 mg total) by mouth daily. 03/07/22   Leroy Sea, MD  WIXELA INHUB 250-50 MCG/ACT AEPB Inhale 1 puff into the lungs 2 (two) times daily. 02/25/22   [provider]      Allergies    Penicillins    Review of Systems   Review of Systems  Physical Exam Updated Vital Signs BP (!) 155/66   Pulse 70   Temp 97.6 F (36.4 C) (Oral)   Resp 20   Ht 5\' 9"  (1.753 m)   Wt 90.7 kg   SpO2 97%   BMI 29.53 kg/m  Physical Exam Vitals and nursing note reviewed. Exam conducted with a chaperone present.  Constitutional:      Appearance: Normal appearance.  HENT:     Head: Normocephalic and atraumatic.  Eyes:     General:  No scleral icterus.       Right eye: No discharge.        Left eye: No discharge.     Extraocular Movements: Extraocular movements intact.     Pupils: Pupils are equal, round, and reactive to light.  Cardiovascular:     Rate and Rhythm: Normal rate and regular rhythm.     Pulses: Normal pulses.     Heart sounds: Normal heart sounds. No murmur heard.   No friction rub. No gallop.  Pulmonary:     Effort: Pulmonary effort is normal. No respiratory distress.     Breath sounds: Normal breath sounds.  Abdominal:     General: Abdomen is flat. Bowel sounds are normal. There is no distension.     Palpations: Abdomen is soft.     Tenderness: There is no abdominal tenderness.  Genitourinary:    Comments: Foley catheter in place, bag absent.  No surrounding erythema or  blood to the urethra. Musculoskeletal:        General: Tenderness present.     Comments: Diffuse tenderness to the lumbar spine, not particularly focal or midline.  Skin:    General: Skin is warm and dry.     Coloration: Skin is not jaundiced.  Neurological:     Mental Status: He is alert. Mental status is at baseline. He is disoriented.     Coordination: Coordination normal.     Comments: Patient is oriented to self, answers questions and follows commands.  At baseline since hospitalization per son and SNF    ED Results / Procedures / Treatments   Labs (all labs ordered are listed, but only abnormal results are displayed) Labs Reviewed  URINALYSIS, ROUTINE W REFLEX MICROSCOPIC - Abnormal; Notable for the following components:      Result Value   Hgb urine dipstick MODERATE (*)    Protein, ur 30 (*)    Leukocytes,Ua SMALL (*)    All other components within normal limits  URINE CULTURE    EKG None  Radiology DG Lumbar Spine Complete  Result Date: 03/11/2022 CLINICAL DATA:  Back pain EXAM: LUMBAR SPINE - COMPLETE 4+ VIEW COMPARISON:  None Available. FINDINGS: Dextroscoliosis is seen. No recent fracture is seen in the lumbar spine. There is mild decrease in height of anterior aspect of body of T11 vertebra, possibly old mild compression. Lateral views are less than optimal related to dextroscoliosis. Degenerative changes are noted with disc space narrowing and bony spurs at multiple levels in the lumbar spine and lower thoracic spine. Arterial calcifications are seen in the soft tissues. IMPRESSION: No recent fracture is seen in the lumbar spine. Mild decrease in height of anterior aspect of body of T11 vertebra may be residual from previous injury. Lumbar spondylosis with disc space narrowing, bony spurs and facet hypertrophy throughout lumbar spine and lower thoracic spine. There is moderate dextroscoliosis in the lumbar spine. Electronically Signed   By: Ernie AvenaPalani  Rathinasamy M.D.   On:  03/11/2022 16:45    Procedures Procedures    Medications Ordered in ED Medications - No data to display  ED Course/ Medical Decision Making/ A&P                           Medical Decision Making Amount and/or Complexity of Data Reviewed Labs: ordered. Radiology: ordered.   Patient presents with Foley catheter concern.  History is primarily provided by the patient's son and the SNF staff.  Essentially there is  concern that the catheter bag was absent and the Foley was emptying onto the patient.  Patient denies any dysuria or hematuria, he has not had any fevers at the nursing home or recent falls.  He is at his baseline since arriving at the SNF.    He is not having any systemic symptoms.  He is slightly hypertensive but not hypoxic, lungs are clear to auscultation he is not febrile.  He has no systemic symptoms, do not feel laboratory work-up indicated at this time.   I also reviewed external records including patient's most recent hospitalization where he was admitted due to acute encephalopathy, There is no clear etiology for the acute metabolic encephalopathy, contributed to past history of heavy alcohol use by the hospitalist.  He had negative neuroimaging, EEG and blood cultures.  The right epididymitis and reactive hydrocele were treated with fluoroquinolones and Bactrim in the outpatient setting.  Patient is followed by urology Dr. Benancio Deeds, suspect the Foley catheter is due to large prostate.   There was miscommunication with the son, patient has been removing the catheter bag.  This was replaced here in the ED and was draining with normal output.    Given he was complaining about back pain lumbar spine plain film was obtained.  I viewed the plain film and agree with radiologist interpretation.  I do not see any evidence of an acute abnormality.  Foley catheter changed out, flushed with urine output.  UA obtained as well as culture, will abstain from starting patient on antibiotics  given he has been on Bactrim since discharge from the hospital and is chronically catheterized.  Per chart review patient tends to have moderate hematuria.    Patient has been kept on cardiac monitoring, I viewed the monitor on reevaluation and the patient is in sinus rhythm.  He is not hypoxic, resting comfortably.  Patient was discharged 4 days ago I do not think new laboratory work-up is indicated at this time especially in the absence of new or systemic symptoms.    I reevaluated the patient and reviewed the cardiac monitor which shows sinus rhythm.  He is not hypoxic.  He states "I feel 100% better since I got here".  Patient is stable to be discharged home to SNF.  Discussed HPI, physical exam and plan of care for this patient with attending Bethann Berkshire. The attending physician evaluated this patient as part of a shared visit and agrees with plan of care.          Final Clinical Impression(s) / ED Diagnoses Final diagnoses:  Problem with Foley catheter, subsequent encounter    Rx / DC Orders ED Discharge Orders     None         Theron Arista, Cordelia Poche 03/11/22 1811    Bethann Berkshire, MD 03/14/22 1442

## 2022-03-11 NOTE — ED Notes (Signed)
EDP at BS 

## 2022-03-11 NOTE — ED Triage Notes (Signed)
Son brings pt in due to pt with foley catheter in place and no bag is attached to catheter.  C/o back pain

## 2022-03-11 NOTE — Discharge Instructions (Signed)
Continue giving home medicine.  Foley was changed, stable for return to SNF

## 2022-03-11 NOTE — ED Notes (Signed)
Son called. Returning to pick pt up at d/c

## 2022-03-13 LAB — URINE CULTURE: Culture: 60000 — AB

## 2022-03-14 ENCOUNTER — Telehealth: Payer: Self-pay | Admitting: *Deleted

## 2022-03-14 DIAGNOSIS — I10 Essential (primary) hypertension: Secondary | ICD-10-CM | POA: Diagnosis not present

## 2022-03-14 DIAGNOSIS — R197 Diarrhea, unspecified: Secondary | ICD-10-CM | POA: Diagnosis not present

## 2022-03-14 DIAGNOSIS — R339 Retention of urine, unspecified: Secondary | ICD-10-CM | POA: Diagnosis not present

## 2022-03-14 NOTE — Progress Notes (Signed)
ED Antimicrobial Stewardship Positive Culture Follow Up   Kenneth Woods is an 88 y.o. male who presented to [facility] on 03/11/2022 with a chief complaint of  Chief Complaint  Patient presents with   foley catheter issues     Recent Results (from the past 720 hour(s))  Urine Culture     Status: None   Collection Time: 02/16/22 10:04 AM   Specimen: Urine, Clean Catch  Result Value Ref Range Status   Specimen Description   Final    URINE, CLEAN CATCH Performed at [facility], [address] [place], [place] [address]    Special Requests   Final    NONE Performed at [facility], [address] [place], [place] [address]    Culture   Final    NO GROWTH Performed at [facility], [address] [place], [place] [address]    Report Status 02/17/2022 FINAL  Final  Urine Culture     Status: None   Collection Time: 02/26/22  2:01 PM   Specimen: In/Out Cath Urine  Result Value Ref Range Status   Specimen Description   Final    IN/OUT CATH URINE Performed at [facility], [address], [place], [place] [address]    Special Requests   Final    NONE Performed at [facility], [address] [place], [place] [address]    Culture   Final    NO GROWTH Performed at [facility], [address] [place], [place] [address]    Report Status 02/28/2022 FINAL  Final  Culture, blood (Routine X 2) w Reflex to ID Panel     Status: None   Collection Time: 02/26/22  4:05 PM   Specimen: BLOOD LEFT FOREARM  Result Value Ref Range Status   Specimen Description   Final    BLOOD LEFT FOREARM Performed at [facility], [address] [place], [place] [address]    Special Requests   Final    BOTTLES DRAWN AEROBIC AND ANAEROBIC Blood Culture adequate volume Performed at [facility], [address], [place], [place] [address]    Culture   Final    NO  GROWTH 5 DAYS Performed at [facility], [address] [place], [place] [address]    Report Status 03/03/2022 FINAL  Final  Culture, blood (Routine X 2) w Reflex to ID Panel     Status: None   Collection Time: 02/26/22  4:20 PM   Specimen: BLOOD LEFT WRIST  Result Value Ref Range Status   Specimen Description   Final    BLOOD LEFT WRIST Performed at [facility], [address], [place], [place] [address]    Special Requests   Final    BOTTLES DRAWN AEROBIC AND ANAEROBIC Blood Culture results may not be optimal due to an inadequate volume of blood received in culture bottles Performed at [facility], [address], [place], [place] [address]    Culture   Final    NO GROWTH 5 DAYS Performed at [facility], [address] [place], [place] [address]    Report Status 03/03/2022 FINAL  Final  Resp Panel by RT-PCR (Flu A&B, Covid) Nasopharyngeal Swab     Status: None   Collection Time: 02/27/22  1:01 AM   Specimen: Nasopharyngeal Swab; Nasopharyngeal(NP) swabs in vial transport medium  Result Value Ref Range Status   SARS Coronavirus 2 by RT PCR NEGATIVE  NEGATIVE Final    Comment: (NOTE) SARS-CoV-2 target nucleic acids are NOT DETECTED.  The SARS-CoV-2 RNA is generally detectable in upper respiratory specimens during the acute phase of infection. The lowest concentration of SARS-CoV-2 viral copies this assay can detect is 138 copies/mL. A negative result does not preclude SARS-Cov-2 infection and should not be used as the sole basis for treatment or other patient management decisions. A negative result may occur with  improper specimen collection/handling, submission of specimen other than nasopharyngeal swab, presence of viral mutation(s) within the areas targeted by this assay, and inadequate number of viral copies(<138 copies/mL). A negative result must be combined with clinical observations, patient history, and  epidemiological information. The expected result is Negative.  Fact Sheet for Patients:  BloggerCourse.com  Fact Sheet for Healthcare Providers:  SeriousBroker.it  This test is no t yet approved or cleared by the [place] FDA and  has been authorized for detection and/or diagnosis of SARS-CoV-2 by FDA under an Emergency Use Authorization (EUA). This EUA will remain  in effect (meaning this test can be used) for the duration of the COVID-19 declaration under Section 564(b)(1) of the Act, 21 U.S.C.section 360bbb-3(b)(1), unless the authorization is terminated  or revoked sooner.       Influenza A by PCR NEGATIVE NEGATIVE Final   Influenza B by PCR NEGATIVE NEGATIVE Final    Comment: (NOTE) The Xpert Xpress SARS-CoV-2/FLU/RSV plus assay is intended as an aid in the diagnosis of influenza from Nasopharyngeal swab specimens and should not be used as a sole basis for treatment. Nasal washings and aspirates are unacceptable for Xpert Xpress SARS-CoV-2/FLU/RSV testing.  Fact Sheet for Patients: BloggerCourse.com  Fact Sheet for Healthcare Providers: SeriousBroker.it  This test is not yet approved or cleared by the [place] FDA and has been authorized for detection and/or diagnosis of SARS-CoV-2 by FDA under an Emergency Use Authorization (EUA). This EUA will remain in effect (meaning this test can be used) for the duration of the COVID-19 declaration under Section 564(b)(1) of the Act, 21 U.S.C. section 360bbb-3(b)(1), unless the authorization is terminated or revoked.  Performed at [facility], [address] [place], [place] [address]   MRSA Next Gen by PCR, Nasal     Status: None   Collection Time: 02/27/22  4:03 AM   Specimen: Nasal Mucosa; Nasal Swab  Result Value Ref Range Status   MRSA by PCR Next Gen NOT DETECTED NOT DETECTED Final    Comment:  (NOTE) The GeneXpert MRSA Assay (FDA approved for NASAL specimens only), is one component of a comprehensive MRSA colonization surveillance program. It is not intended to diagnose MRSA infection nor to guide or monitor treatment for MRSA infections. Test performance is not FDA approved in patients less than 47 years old. Performed at [facility], [address] [place], [place] [address]   Body fluid culture w Gram Stain     Status: None   Collection Time: 02/27/22  7:36 AM   Specimen: Body Fluid  Result Value Ref Range Status   Specimen Description FLUID  Final   Special Requests HYDROCELE SAC  Final   Gram Stain NO WBC SEEN NO ORGANISMS SEEN   Final   Culture   Final    NO GROWTH 3 DAYS Performed at [facility], [address] [place], [place] [address]    Report Status 03/02/2022 FINAL  Final  Urine Culture     Status: Abnormal   Collection Time:  03/11/22  4:52 PM   Specimen: Urine, Clean Catch  Result Value Ref Range Status   Specimen Description   Final    URINE, CLEAN CATCH Performed at [facility], [address] [place], [place] [address]    Special Requests   Final    NONE Performed at [facility], [address] [place], [place] [address]    Culture 60,000 COLONIES/mL YEAST (A)  Final   Report Status 03/13/2022 FINAL  Final    Urine culture reflects contaminant. No antibiotics indicated.   ED Provider: Raynald BlendErica Conklin, PA-C   Rushie Goltzachel S Taivon Haroon 03/14/2022, 9:19 AM Clinical Pharmacist

## 2022-03-14 NOTE — Telephone Encounter (Signed)
Post ED Visit - Positive Culture Follow-up  Culture report reviewed by antimicrobial stewardship pharmacist: [facility] []  , Pharm.D. []  Enzo Bi, Pharm.D., BCPS AQ-ID []  , Pharm.D., BCPS []  Celedonio Miyamoto, .D., BCPS []  [place], .D., BCPS, AAHIVP []  Georgina Pillion, Pharm.D., BCPS, AAHIVP []  [address], PharmD, BCPS []  , PharmD, BCPS []  [place], PharmD, BCPS []  [address], PharmD []  , PharmD, BCPS []  Estella Husk, PharmD  Pharmacy Team []  Lysle Pearl, PharmD []  , PharmD []  Phillips Climes, PharmD []  , Rph []  Agapito Games) , PharmD []  Verlan Friends, PharmD []  , PharmD []  Mervyn Gay, PharmD []  , PharmD []  Vinnie Level, PharmD []  [facility], PharmD []  , PharmD []  Len Childs, PharmD   Positive urine culture No therapy needed and no further patient follow-up is required at this time.  , PharmD  Greer Pickerel Talley 03/14/2022, 8:09 AM

## 2022-03-16 DEATH — deceased

## 2022-04-17 ENCOUNTER — Telehealth: Payer: Self-pay | Admitting: Diagnostic Neuroimaging

## 2022-04-17 ENCOUNTER — Encounter: Payer: Self-pay | Admitting: Diagnostic Neuroimaging

## 2022-04-17 NOTE — Telephone Encounter (Signed)
Called to inform pt of need to reschedule 7/11 appointment but was informed by pt's son that the pt is deceased. Appointment was canceled.

## 2022-04-25 ENCOUNTER — Ambulatory Visit: Payer: Medicare Other | Admitting: Diagnostic Neuroimaging
